# Patient Record
Sex: Female | Born: 1965 | Race: Black or African American | Hispanic: No | Marital: Married | Smoking: Never smoker
Health system: Southern US, Community
[De-identification: ages and names within clinical notes are randomized; demographics above are authoritative.]

## PROBLEM LIST (undated history)

## (undated) DIAGNOSIS — J189 Pneumonia, unspecified organism: Secondary | ICD-10-CM

## (undated) DIAGNOSIS — Z923 Personal history of irradiation: Secondary | ICD-10-CM

## (undated) DIAGNOSIS — E119 Type 2 diabetes mellitus without complications: Secondary | ICD-10-CM

## (undated) DIAGNOSIS — C50919 Malignant neoplasm of unspecified site of unspecified female breast: Secondary | ICD-10-CM

## (undated) DIAGNOSIS — I10 Essential (primary) hypertension: Secondary | ICD-10-CM

## (undated) HISTORY — DX: Malignant neoplasm of unspecified site of unspecified female breast: C50.919

## (undated) HISTORY — DX: Type 2 diabetes mellitus without complications: E11.9

---

## 2003-06-19 ENCOUNTER — Other Ambulatory Visit: Admission: RE | Admit: 2003-06-19 | Discharge: 2003-06-19 | Payer: Self-pay | Admitting: Family Medicine

## 2004-09-06 ENCOUNTER — Other Ambulatory Visit: Admission: RE | Admit: 2004-09-06 | Discharge: 2004-09-06 | Payer: Self-pay | Admitting: Family Medicine

## 2004-09-13 ENCOUNTER — Encounter: Admission: RE | Admit: 2004-09-13 | Discharge: 2004-09-13 | Payer: Self-pay | Admitting: Family Medicine

## 2006-09-24 ENCOUNTER — Encounter: Admission: RE | Admit: 2006-09-24 | Discharge: 2006-09-24 | Payer: Self-pay | Admitting: Family Medicine

## 2007-02-25 ENCOUNTER — Other Ambulatory Visit: Admission: RE | Admit: 2007-02-25 | Discharge: 2007-02-25 | Payer: Self-pay | Admitting: Family Medicine

## 2010-03-03 ENCOUNTER — Encounter: Payer: Self-pay | Admitting: Endocrinology

## 2010-05-27 ENCOUNTER — Other Ambulatory Visit (HOSPITAL_COMMUNITY)
Admission: RE | Admit: 2010-05-27 | Discharge: 2010-05-27 | Disposition: A | Discharge status: Home / Self Care | Payer: BC Managed Care – PPO | Source: Ambulatory Visit | Attending: Family Medicine | Admitting: Family Medicine

## 2010-05-27 ENCOUNTER — Other Ambulatory Visit: Payer: Self-pay | Admitting: Family Medicine

## 2010-05-27 DIAGNOSIS — Z113 Encounter for screening for infections with a predominantly sexual mode of transmission: Secondary | ICD-10-CM | POA: Insufficient documentation

## 2010-05-27 DIAGNOSIS — Z09 Encounter for follow-up examination after completed treatment for conditions other than malignant neoplasm: Secondary | ICD-10-CM

## 2010-05-27 DIAGNOSIS — Z1159 Encounter for screening for other viral diseases: Secondary | ICD-10-CM | POA: Insufficient documentation

## 2010-05-27 DIAGNOSIS — Z124 Encounter for screening for malignant neoplasm of cervix: Secondary | ICD-10-CM | POA: Insufficient documentation

## 2010-06-05 ENCOUNTER — Ambulatory Visit
Admission: RE | Admit: 2010-06-05 | Discharge: 2010-06-05 | Disposition: A | Discharge status: Home / Self Care | Payer: BC Managed Care – PPO | Source: Ambulatory Visit | Attending: Family Medicine | Admitting: Family Medicine

## 2010-06-05 DIAGNOSIS — Z09 Encounter for follow-up examination after completed treatment for conditions other than malignant neoplasm: Secondary | ICD-10-CM

## 2012-01-28 ENCOUNTER — Other Ambulatory Visit: Payer: Self-pay | Admitting: Family Medicine

## 2012-01-28 DIAGNOSIS — Z1231 Encounter for screening mammogram for malignant neoplasm of breast: Secondary | ICD-10-CM

## 2012-03-05 ENCOUNTER — Ambulatory Visit
Admission: RE | Admit: 2012-03-05 | Discharge: 2012-03-05 | Disposition: A | Discharge status: Home / Self Care | Payer: 59 | Source: Ambulatory Visit | Attending: Family Medicine | Admitting: Family Medicine

## 2012-03-05 DIAGNOSIS — Z1231 Encounter for screening mammogram for malignant neoplasm of breast: Secondary | ICD-10-CM

## 2012-09-07 ENCOUNTER — Encounter: Payer: 59 | Attending: Family Medicine | Admitting: *Deleted

## 2012-09-07 VITALS — Ht 66.0 in | Wt 254.6 lb

## 2012-09-07 DIAGNOSIS — E119 Type 2 diabetes mellitus without complications: Secondary | ICD-10-CM | POA: Insufficient documentation

## 2012-09-07 DIAGNOSIS — Z713 Dietary counseling and surveillance: Secondary | ICD-10-CM | POA: Insufficient documentation

## 2012-09-10 ENCOUNTER — Encounter: Payer: Self-pay | Admitting: *Deleted

## 2012-09-10 NOTE — Progress Notes (Signed)
Patient was seen on 09/07/2012 for the first of a series of three diabetes self-management courses at the [facility].   Current HbA1c: 7.2%  The following learning objectives were met by the patient during this course:   Defines the role of glucose and insulin  Identifies type of diabetes and pathophysiology  Defines the diagnostic criteria for diabetes and prediabetes  States the risk factors for Type 2 Diabetes  States the symptoms of Type 2 Diabetes  Defines Type 2 Diabetes treatment goals  Defines Type 2 Diabetes treatment options  States the rationale for glucose monitoring  Identifies A1C, glucose targets, and testing times  Identifies proper sharps disposal  Defines the purpose of a diabetes food plan  Identifies carbohydrate food groups  Defines effects of carbohydrate foods on glucose levels  Identifies carbohydrate choices/grams/food labels  States benefits of physical activity and effect on glucose  Review of suggested activity guidelines  Handouts given during class include:  Type 2 Diabetes: Basics Book  My Food Plan Book  Food and Activity Log  Your patient has identified their diabetes self-care support plan as:  [facility] support group  Continued diabetes classes  Follow-Up Plan: Attend core 2 and core 3

## 2012-09-14 ENCOUNTER — Encounter: Payer: 59 | Attending: Family Medicine

## 2012-09-14 DIAGNOSIS — Z713 Dietary counseling and surveillance: Secondary | ICD-10-CM | POA: Insufficient documentation

## 2012-09-14 DIAGNOSIS — E119 Type 2 diabetes mellitus without complications: Secondary | ICD-10-CM

## 2012-09-15 NOTE — Progress Notes (Signed)
Patient was seen on 09/14/12 for the second of a series of three diabetes self-management courses at the [facility]. The following learning objectives were met by the patient during this course:   Explain basic nutrition maintenance and quality assurance  Describe causes, symptoms and treatment of hypoglycemia and hyperglycemia  Explain how to manage diabetes during illness  Describe the importance of good nutrition for health and healthy eating strategies  List strategies to follow meal plan when dining out  Describe the effects of alcohol on glucose and how to use it safely  Describe problem solving skills for day-to-day glucose challenges  Describe strategies to use when treatment plan needs to change  Identify important factors involved in successful weight loss  Describe ways to remain physically active  Describe the impact of regular activity on insulin resistance  Identify current diabetes medications, their action on blood glucose, and [pssible side effects.  Handouts given in class:  Refrigerator magnet for Sick Day Guidelines  [facility] Oral medication/insulin handout  Your patient has identified their diabetes self-care support plan as:  [facility] support group   Follow-Up Plan: Patient will attend the final class of the ADA Diabetes Self-Care Education.   

## 2012-09-28 DIAGNOSIS — E119 Type 2 diabetes mellitus without complications: Secondary | ICD-10-CM

## 2012-10-01 NOTE — Progress Notes (Signed)
Patient was seen on 09/28/12 for the third of a series of three diabetes self-management courses at the [facility]. The following learning objectives were met by the patient during this course:    Describe how diabetes changes over time   Identify diabetes complications and ways to prevent them   Describe strategies that can promote heart health including lowering blood pressure and cholesterol   Describe strategies to lower dietary fat and sodium in the diet   Identify physical activities that benefit cardiovascular health   Describe role of stress on blood glucose and develop strategies to address psychosocial issues   Evaluate success in meeting personal goal   Describe the belief that they can live successfully with diabetes day to day   Establish 2-3 goals that they will plan to diligently work on until they return for the free 33-month follow-up visit  The following handouts were given in class:  Goal setting handout  Class evaluation form  Low-sodium seasoning tips  Stress management handout  Your patient has established the following 4 month goals for diabetes self-care:  Count my carbohydrates at most meals and snacks  Be active 45 minutes or more 4-5 days a week  Your patient has identified these potential barriers to change:  No barriers identified  Your patient has identified their diabetes self-care support plan as:  [facility] support group   Follow-Up Plan: Patient was offered a 4 month follow-up visit for diabetes self-management education.

## 2012-11-01 ENCOUNTER — Other Ambulatory Visit: Payer: Self-pay | Admitting: Family Medicine

## 2012-11-01 DIAGNOSIS — N939 Abnormal uterine and vaginal bleeding, unspecified: Secondary | ICD-10-CM

## 2012-11-05 ENCOUNTER — Ambulatory Visit
Admission: RE | Admit: 2012-11-05 | Discharge: 2012-11-05 | Disposition: A | Discharge status: Home / Self Care | Payer: 59 | Source: Ambulatory Visit | Attending: Family Medicine | Admitting: Family Medicine

## 2012-11-05 ENCOUNTER — Other Ambulatory Visit: Payer: 59

## 2012-11-05 DIAGNOSIS — N939 Abnormal uterine and vaginal bleeding, unspecified: Secondary | ICD-10-CM

## 2013-01-21 ENCOUNTER — Encounter: Payer: 59 | Attending: Family Medicine | Admitting: *Deleted

## 2013-01-21 VITALS — Ht 66.0 in | Wt 247.1 lb

## 2013-01-21 DIAGNOSIS — E119 Type 2 diabetes mellitus without complications: Secondary | ICD-10-CM | POA: Insufficient documentation

## 2013-01-21 DIAGNOSIS — Z713 Dietary counseling and surveillance: Secondary | ICD-10-CM | POA: Insufficient documentation

## 2013-01-21 NOTE — Progress Notes (Signed)
  Patient was seen on 01/21/13 for her 4 month follow-up as a part of the diabetes self-management courses at the [facility].   Patient self reports the following: Weight gain of 30# over the past 3 years. Is now working from home. Biggest Challenge.Marland KitchenMarland KitchenMarland KitchenExercise: Gets up in the AM and get on treadmill for approx 45-60 minutes daily. Considering dancing. Dietary: switched to Sprite Zero, significantly decreased chocolate consumption. A1c improved from prior to education now approx 6.7%  Down from 7.2%  Diabetes control has improved since diabetes self-management training: Has modified dietary consumption significantly and physical activity. Number of days blood glucose is >200: Testing has not been recommended Last MD appointment for diabetes: October 2014, next appointment scheduled March 2015 Changes in treatment plan: Begin testing glucose with Accu Check Nano 3X weekly varying times of the day to include FBS and 2hpp. Authorization received from Dr. Gretel Acre office [place]) [phone]. Their office has sent order for testing supplies to [facility] [address] Confidence with ability to manage diabetes: high Areas for improvement with diabetes self-care: Counting carbohydrates and testing Willingness to participate in diabetes support group: Not at this time Return in follow up 6 weeks to evaluate glucose readings and how they relate to her dietary consumption.

## 2013-02-22 ENCOUNTER — Telehealth: Payer: Self-pay | Admitting: *Deleted

## 2013-02-25 ENCOUNTER — Ambulatory Visit: Payer: 59 | Admitting: *Deleted

## 2013-03-25 ENCOUNTER — Ambulatory Visit: Payer: 59 | Admitting: *Deleted

## 2013-05-13 ENCOUNTER — Ambulatory Visit: Payer: 59 | Admitting: *Deleted

## 2013-05-31 NOTE — Telephone Encounter (Signed)
Telephone encounter.

## 2013-07-01 ENCOUNTER — Encounter: Payer: Self-pay | Admitting: *Deleted

## 2013-07-01 ENCOUNTER — Encounter: Payer: 59 | Attending: Family Medicine | Admitting: *Deleted

## 2013-07-01 VITALS — Ht 67.5 in | Wt 243.4 lb

## 2013-07-01 DIAGNOSIS — E119 Type 2 diabetes mellitus without complications: Secondary | ICD-10-CM | POA: Insufficient documentation

## 2013-07-01 DIAGNOSIS — Z713 Dietary counseling and surveillance: Secondary | ICD-10-CM | POA: Insufficient documentation

## 2013-07-01 DIAGNOSIS — R634 Abnormal weight loss: Secondary | ICD-10-CM | POA: Insufficient documentation

## 2013-07-01 NOTE — Progress Notes (Signed)
  Patient was seen on 07/01/13 for her final follow-up as a part of the diabetes self-management courses at the [facility].   Patient self reports the following: Happy with weight loss of another 4 pounds since last visit and total loss of 24 pounds since diagnosed with diabetes last year. Continues to exercise: Gets up in the AM and get on treadmill for approx 45-60 minutes daily. Considering a 5 K walk with her work friends Dietary: continues with switch to [employer], significantly decreased chocolate consumption. A1c improved from prior to education now approx 6.2%  Down from 7.2%  Follow up PRN

## 2014-02-10 DIAGNOSIS — C50919 Malignant neoplasm of unspecified site of unspecified female breast: Secondary | ICD-10-CM

## 2014-02-10 DIAGNOSIS — Z923 Personal history of irradiation: Secondary | ICD-10-CM

## 2014-02-10 HISTORY — DX: Malignant neoplasm of unspecified site of unspecified female breast: C50.919

## 2014-02-10 HISTORY — DX: Personal history of irradiation: Z92.3

## 2014-03-31 ENCOUNTER — Other Ambulatory Visit: Payer: Self-pay

## 2014-03-31 DIAGNOSIS — Z1231 Encounter for screening mammogram for malignant neoplasm of breast: Secondary | ICD-10-CM

## 2014-04-07 ENCOUNTER — Ambulatory Visit
Admission: RE | Admit: 2014-04-07 | Discharge: 2014-04-07 | Disposition: A | Discharge status: Home / Self Care | Payer: 59 | Source: Ambulatory Visit

## 2014-04-07 DIAGNOSIS — Z1231 Encounter for screening mammogram for malignant neoplasm of breast: Secondary | ICD-10-CM

## 2014-04-10 ENCOUNTER — Other Ambulatory Visit: Payer: Self-pay | Admitting: Family Medicine

## 2014-04-10 DIAGNOSIS — R928 Other abnormal and inconclusive findings on diagnostic imaging of breast: Secondary | ICD-10-CM

## 2014-04-14 ENCOUNTER — Ambulatory Visit
Admission: RE | Admit: 2014-04-14 | Discharge: 2014-04-14 | Disposition: A | Discharge status: Home / Self Care | Payer: 59 | Source: Ambulatory Visit | Attending: Family Medicine | Admitting: Family Medicine

## 2014-04-14 ENCOUNTER — Other Ambulatory Visit: Payer: Self-pay | Admitting: Family Medicine

## 2014-04-14 DIAGNOSIS — R928 Other abnormal and inconclusive findings on diagnostic imaging of breast: Secondary | ICD-10-CM

## 2014-04-14 DIAGNOSIS — R921 Mammographic calcification found on diagnostic imaging of breast: Secondary | ICD-10-CM

## 2014-04-18 ENCOUNTER — Other Ambulatory Visit: Payer: Self-pay | Admitting: Family Medicine

## 2014-04-18 DIAGNOSIS — C50911 Malignant neoplasm of unspecified site of right female breast: Secondary | ICD-10-CM

## 2014-04-19 ENCOUNTER — Inpatient Hospital Stay: Admission: RE | Admit: 2014-04-19 | Payer: 59 | Source: Ambulatory Visit

## 2014-04-19 ENCOUNTER — Telehealth: Payer: Self-pay | Admitting: *Deleted

## 2014-04-19 DIAGNOSIS — C50411 Malignant neoplasm of upper-outer quadrant of right female breast: Secondary | ICD-10-CM

## 2014-04-19 NOTE — Telephone Encounter (Signed)
Error

## 2014-04-19 NOTE — Telephone Encounter (Signed)
Left message for a return phone call to schedule for [facility] for 04/26/14.  Awaiting patient response.

## 2014-04-19 NOTE — Telephone Encounter (Signed)
Confirmed [facility] for 04/26/14 at 12N .  Instructions and contact information given.

## 2014-04-26 ENCOUNTER — Encounter: Payer: Self-pay | Admitting: Hematology and Oncology

## 2014-04-26 ENCOUNTER — Ambulatory Visit
Admission: RE | Admit: 2014-04-26 | Discharge: 2014-04-26 | Disposition: A | Discharge status: Home / Self Care | Payer: 59 | Source: Ambulatory Visit | Attending: Family Medicine | Admitting: Family Medicine

## 2014-04-26 ENCOUNTER — Ambulatory Visit
Admission: RE | Admit: 2014-04-26 | Discharge: 2014-04-26 | Disposition: A | Discharge status: Home / Self Care | Payer: 59 | Source: Ambulatory Visit | Attending: Radiation Oncology | Admitting: Radiation Oncology

## 2014-04-26 ENCOUNTER — Ambulatory Visit (HOSPITAL_BASED_OUTPATIENT_CLINIC_OR_DEPARTMENT_OTHER): Payer: 59 | Admitting: Hematology and Oncology

## 2014-04-26 ENCOUNTER — Ambulatory Visit: Payer: 59

## 2014-04-26 ENCOUNTER — Other Ambulatory Visit: Payer: Self-pay | Admitting: General Surgery

## 2014-04-26 ENCOUNTER — Other Ambulatory Visit (HOSPITAL_BASED_OUTPATIENT_CLINIC_OR_DEPARTMENT_OTHER): Payer: 59

## 2014-04-26 ENCOUNTER — Ambulatory Visit: Payer: 59 | Admitting: Physical Therapy

## 2014-04-26 VITALS — BP 152/88 | HR 113 | Temp 98.0°F | Resp 20 | Ht 67.5 in | Wt 248.7 lb

## 2014-04-26 DIAGNOSIS — D0511 Intraductal carcinoma in situ of right breast: Secondary | ICD-10-CM

## 2014-04-26 DIAGNOSIS — C50411 Malignant neoplasm of upper-outer quadrant of right female breast: Secondary | ICD-10-CM

## 2014-04-26 DIAGNOSIS — D0501 Lobular carcinoma in situ of right breast: Secondary | ICD-10-CM

## 2014-04-26 DIAGNOSIS — C50911 Malignant neoplasm of unspecified site of right female breast: Secondary | ICD-10-CM

## 2014-04-26 DIAGNOSIS — Z170 Estrogen receptor positive status [ER+]: Secondary | ICD-10-CM

## 2014-04-26 LAB — COMPREHENSIVE METABOLIC PANEL (CC13)
ALBUMIN: 4 g/dL (ref 3.5–5.0)
ALK PHOS: 71 U/L (ref 40–150)
ALT: 16 U/L (ref 0–55)
AST: 12 U/L (ref 5–34)
Anion Gap: 11 mEq/L (ref 3–11)
BILIRUBIN TOTAL: 0.28 mg/dL (ref 0.20–1.20)
BUN: 14.7 mg/dL (ref 7.0–26.0)
CO2: 27 meq/L (ref 22–29)
Calcium: 9.5 mg/dL (ref 8.4–10.4)
Chloride: 106 mEq/L (ref 98–109)
Creatinine: 1 mg/dL (ref 0.6–1.1)
EGFR: 80 mL/min/{1.73_m2} — AB (ref >90)
GLUCOSE: 121 mg/dL (ref 70–140)
POTASSIUM: 4.2 meq/L (ref 3.5–5.1)
Sodium: 144 mEq/L (ref 136–145)
TOTAL PROTEIN: 7.6 g/dL (ref 6.4–8.3)

## 2014-04-26 LAB — CBC WITH DIFFERENTIAL/PLATELET
BASO%: 1 % (ref 0.0–2.0)
Basophils Absolute: 0.1 10*3/uL (ref 0.0–0.1)
EOS ABS: 0.1 10*3/uL (ref 0.0–0.5)
EOS%: 1.2 % (ref 0.0–7.0)
HCT: 36.9 % (ref 34.8–46.6)
HEMOGLOBIN: 11.4 g/dL — AB (ref 11.6–15.9)
LYMPH%: 37.1 % (ref 14.0–49.7)
MCH: 26.1 pg (ref 25.1–34.0)
MCHC: 30.9 g/dL — ABNORMAL LOW (ref 31.5–36.0)
MCV: 84.4 fL (ref 79.5–101.0)
MONO#: 0.5 10*3/uL (ref 0.1–0.9)
MONO%: 8.4 % (ref 0.0–14.0)
NEUT%: 52.3 % (ref 38.4–76.8)
NEUTROS ABS: 3.3 10*3/uL (ref 1.5–6.5)
Platelets: 336 10*3/uL (ref 145–400)
RBC: 4.37 10*6/uL (ref 3.70–5.45)
RDW: 15.7 % — AB (ref 11.2–14.5)
WBC: 6.3 10*3/uL (ref 3.9–10.3)
lymph#: 2.3 10*3/uL (ref 0.9–3.3)

## 2014-04-26 MED ORDER — GADOBENATE DIMEGLUMINE 529 MG/ML IV SOLN
20.0000 mL | Freq: Once | INTRAVENOUS | Status: AC | PRN
  Administered 2014-04-26: 20 mL via INTRAVENOUS

## 2014-04-26 NOTE — Assessment & Plan Note (Signed)
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 She has an MRI scheduled for later today. I explained the difference between DCIS and LCIS versus invasive breast cancer. We discussed the significance of estrogen progesterone receptors and their significance on treatment decisions.   Recommendation based multidisciplinary tumor board: 1.  Breast conserving surgery followed by radiation therapy followed by 2.  Antiestrogen therapy with tamoxifen 20 mg once daily 5 years   Tamoxifen counseling: I briefly discussed the risks and benefits of tamoxifen. I will discuss this more with her once she is ready to get started on treatment.   return to clinic after surgery for follow-up and to discuss adjuvant treatment plan.

## 2014-04-26 NOTE — Progress Notes (Signed)
Ms. Wardrip is a very pleasant 49 y.o. 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  Biopsy results revealed the tumor's hormone status as ER positive and PR positive..  She presents today to the [facility]) for treatment consideration and recommendations from the breast surgeon, radiation oncologist, and medical oncologist.     I briefly met with Ms. Chauvin during her [facility] visit today. We discussed the purpose of the Survivorship Clinic, which will include monitoring for recurrence, coordinating completion of age and gender-appropriate cancer screenings, promotion of overall wellness, as well as managing potential late/long-term side effects of anti-cancer treatments.    The treatment plan for Ms. Mcgregory will likely include surgery, radiation therapy, and anti-estrogen therapy.  As of today, the intent of treatment for Ms. Stefano is cure, therefore she will be eligible for the Survivorship Clinic upon her completion of treatment.  Her survivorship care plan (SCP) document will be drafted and updated throughout the course of her treatment trajectory. She will receive the SCP in an office visit with myself in the Survivorship Clinic once she has completed treatment.   Ms. Outen was encouraged to ask questions and all questions were answered to her satisfaction.  She was given my business card and encouraged to contact me with any concerns regarding survivorship.  I look forward to participating in her care.   Mike Craze, NP [place] [phone]

## 2014-04-26 NOTE — Progress Notes (Signed)
Checked in new pt with no financial concerns prior to seeing the dr. Informed pt if chemo is part of her treatment I will get auth from her insurance company as well as contact foundations that offer copay assistance for chemo if needed. She has my card for any billing questions or concerns.

## 2014-04-26 NOTE — Progress Notes (Signed)
  Radiation Oncology         [phone]) [phone] ________________________________  Initial outpatient Consultation - Date: 04/26/2014   Name: [name] [name] MRN: [id number]   DOB: [birth year]  REFERRING PHYSICIAN: Jovita Kussmaul, MD  DIAGNOSIS:    ICD-9-CM ICD-10-CM   1. Breast cancer of upper-outer quadrant of right female breast 174.4 C50.411    HISTORY OF PRESENT ILLNESS::[name] [name] is a 49 y.o. female  Who underwent a screening mammogram and was found to have an area of calcifications in the upper outer quadrant. A biopsy was performed which showed mammary carcinoma in situ- likely DCIS and LCIS. Her DCIS was ER+PR+. She has an MRI tonight. She is GxP2. She is unaccompanied. She had done well with her biopsy. She is interested in breast conservation.   PREVIOUS RADIATION THERAPY: No  Past medical, social and family history were reviewed in the electronic chart. Review of symptoms was reviewed in the electronic chart. Medications were reviewed in the electronic chart.   PHYSICAL EXAM: There were no vitals filed for this visit.. . Pleasant female in no distress. Bruising in the upper outer quadrant with some palpable bruising of the right breast. No palpable lymphadenopathy. No palpable abnormalities of the left breast.   IMPRESSION: DCIS of the right breast  PLAN:I spoke to the patient today regarding her diagnosis and options for treatment. We discussed the equivalence in terms of survival and local failure between mastectomy and breast conservation. We discussed the role of radiation in decreasing local failures in patients who undergo lumpectomy. We discussed the process of simulation and the placement tattoos. We discussed 4-6 weeks of treatment as an outpatient. We discussed the possibility of asymptomatic lung damage. We discussed the low likelihood of secondary malignancies. We discussed the possible side effects including but not limited to skin redness, fatigue, permanent skin darkening, and  breast swelling.   We discussed her excellent prognosis. She may want to go on intermittent disability which I think is reasonable. She works for [employer] at home.   She met with physical therapy, social work, and our Industrial/product designer.   I spent 40 minutes  face to face with the patient and more than 50% of that time was spent in counseling and/or coordination of care.   ------------------------------------------------  Thea Silversmith, MD

## 2014-04-26 NOTE — Progress Notes (Signed)
[place] NOTE  Patient Care Team: Kathyrn Lass, MD as PCP - General (Family Medicine) Autumn Messing III, MD as Consulting Physician (General Surgery) Nicholas Lose, MD as Consulting Physician (Hematology and Oncology) Gery Pray, MD as Consulting Physician (Radiation Oncology) Rockwell Germany, RN as Registered Nurse Mauro Kaufmann, RN as Registered Nurse  CHIEF COMPLAINTS/PURPOSE OF CONSULTATION:  Newly diagnosed  Right breast DCIS plus LCIS  HISTORY OF PRESENTING ILLNESS:  [name] [name] 49 y.o. female is here because of recent diagnosis of  Right breast DCIS plus LCIS. Patient had a routine screening mammogram that detected calcifications in the right breast in the upper outer quadrant 7 mm in size at 9 to 10:00 position. This was biopsied and proven to be mammary carcinoma in situ with calcifications. The cancer was heterogeneous with some cells suggesting E-Cadherin positive and some were negative. ER was 95% and PR was 66%. She was presented the moderate spurring tumor board and she is here today at the [facility] clinic to discuss the treatment plan.  I reviewed her records extensively and collaborated the history with the patient.  SUMMARY OF ONCOLOGIC HISTORY:   Breast cancer of upper-outer quadrant of right female breast   04/14/2014 Mammogram Right breast upper outer quadrant 7 x 3 mm pleomorphic calcifications   04/14/2014 Initial Diagnosis Right breast needle biopsy: Mammary carcinoma in situ with calcifications at 9 to 10:00 position ER 95%, PR 66%, DCIS plus LCIS, heterogeneous somewhat E Cadherin positive some negative    In terms of breast cancer risk profile:  She menarched at early age of  48  She had  2 pregnancy, her first child was born at age  35  She  has received  IUD for approximately  5 years She was never exposed to fertility medications or hormone replacement therapy.  She has  no family history of Breast/GYN/GI cancer  MEDICAL HISTORY:  Past Medical  History  Diagnosis Date  . Diabetes mellitus without complication   . Breast cancer     SURGICAL HISTORY: History reviewed. No pertinent past surgical history.  SOCIAL HISTORY: History   Social History  . Marital Status: Married    Spouse Name: N/A  . Number of Children: N/A  . Years of Education: N/A   Occupational History  . Not on file.   Social History Main Topics  . Smoking status: Never Smoker   . Smokeless tobacco: Not on file  . Alcohol Use: No  . Drug Use: No  . Sexual Activity: Not on file   Other Topics Concern  . Not on file   Social History Narrative    FAMILY HISTORY: Family History  Problem Relation Age of Onset  . Colon cancer Mother     ALLERGIES:  is allergic to food; penicillins; and tetracyclines & related.  MEDICATIONS:  Current Outpatient Prescriptions  Medication Sig Dispense Refill  . metFORMIN (GLUCOPHAGE) 500 MG tablet Take 500 mg by mouth 2 (two) times daily with a meal.     No current facility-administered medications for this visit.    REVIEW OF SYSTEMS:   Constitutional: Denies fevers, chills or abnormal night sweats Eyes: Denies blurriness of vision, double vision or watery eyes Ears, nose, mouth, throat, and face: Denies mucositis or sore throat Respiratory: Denies cough, dyspnea or wheezes Cardiovascular: Denies palpitation, chest discomfort or lower extremity swelling Gastrointestinal:  Denies nausea, heartburn or change in bowel habits Skin: Denies abnormal skin rashes Lymphatics: Denies new lymphadenopathy or easy bruising  Neurological:Denies numbness, tingling or new weaknesses Behavioral/Psych: Mood is stable, no new changes  Breast:  Denies any palpable lumps or discharge All other systems were reviewed with the patient and are negative.  PHYSICAL EXAMINATION: ECOG PERFORMANCE STATUS: 0 - Asymptomatic  Filed Vitals:   04/26/14 1246  BP: 152/88  Pulse: 113  Temp: 98 F (36.7 C)  Resp: 20   Filed Weights    04/26/14 1246  Weight: 248 lb 11.2 oz (112.81 kg)    GENERAL:alert, no distress and comfortable SKIN: skin color, texture, turgor are normal, no rashes or significant lesions EYES: normal, conjunctiva are pink and non-injected, sclera clear OROPHARYNX:no exudate, no erythema and lips, buccal mucosa, and tongue normal  NECK: supple, thyroid normal size, non-tender, without nodularity LYMPH:  no palpable lymphadenopathy in the cervical, axillary or inguinal LUNGS: clear to auscultation and percussion with normal breathing effort HEART: regular rate & rhythm and no murmurs and no lower extremity edema ABDOMEN:abdomen soft, non-tender and normal bowel sounds Musculoskeletal:no cyanosis of digits and no clubbing  PSYCH: alert & oriented x 3 with fluent speech NEURO: no focal motor/sensory deficits BREAST: No palpable nodules in breast. No palpable axillary or supraclavicular lymphadenopathy (exam performed in the presence of a chaperone)   LABORATORY DATA:  I have reviewed the data as listed Lab Results  Component Value Date   WBC 6.3 04/26/2014   HGB 11.4* 04/26/2014   HCT 36.9 04/26/2014   MCV 84.4 04/26/2014   PLT 336 04/26/2014   Lab Results  Component Value Date   NA 144 04/26/2014   K 4.2 04/26/2014   CO2 27 04/26/2014    RADIOGRAPHIC STUDIES: I have personally reviewed the radiological reports and agreed with the findings in the report. 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 She has an MRI scheduled for later today. I explained the difference between DCIS and LCIS versus invasive breast cancer. We discussed the significance of estrogen progesterone receptors and their  significance on treatment decisions.   Recommendation based multidisciplinary tumor board: 1.  Breast conserving surgery followed by radiation therapy followed by 2.  Antiestrogen therapy with tamoxifen 20 mg once daily 5 years   Tamoxifen counseling: I briefly discussed the risks and benefits of tamoxifen. I will discuss this more with her once she is ready to get started on treatment.   return to clinic after surgery for follow-up and to discuss adjuvant treatment plan.    All questions were answered. The patient knows to call the clinic with any problems, questions or concerns.    Rulon Eisenmenger, MD 3:27 PM

## 2014-04-26 NOTE — Progress Notes (Signed)
Note created by Dr. Gudena during office visit, copy to patient,original to scan. 

## 2014-04-28 ENCOUNTER — Encounter: Payer: Self-pay | Admitting: General Practice

## 2014-04-28 ENCOUNTER — Other Ambulatory Visit: Payer: Self-pay | Admitting: Family Medicine

## 2014-04-28 DIAGNOSIS — R928 Other abnormal and inconclusive findings on diagnostic imaging of breast: Secondary | ICD-10-CM

## 2014-04-28 NOTE — Progress Notes (Signed)
   Note:  Previous distress screen (Initial Screen, beginning with distress score of 3) entered in error.  Correct data for Initial Screen follow below:  [place] Work  Visited with Gerica in [facility] to introduce [facility] team/resources and to review distress screen per protocol.  The patient scored a 9 on the Psychosocial Distress Thermometer which indicates severe distress. Clinical Also assessed for distress and other psychosocial needs.   ONCBCN DISTRESS SCREENING 04/28/2014  Screening Type Initial Screening  Distress experienced in past week (1-10) 9  Family Problem type   Emotional problem type Nervousness/Anxiety;Adjusting to illness  Information Concerns Type   Physical Problem type   Referral to support programs Yes  Other Spiritual Care, counseling interns   [place] describes herself as "private," "reserved," and having a "tendency to take on too much."  She has shared her dx with husband and older son, who was home for spring break.  Hasn't shared with younger son yet, but plans to after he has completed a couple of special planned activities.  She did share with her pastor, who has been helpful with support.  Kortne describes herself as "overwhelmed" and uses control to manage her sense of vulnerability.  Per pt, her mom died of cancer, which adds more layers of distress.  [place] was receptive to chaplain and counseling intern, noting the usefulness of support from someone outside her family/community (privacy; no danger of supporter's trying to "fix" her situation; no need to "protect" supporter).  Provided pastoral presence and reflective listening.  Described support resources and opportunities in detail.   Follow up needed: No.  Per pt's permission, will refer to [place].  Please also page as needs arise.  [place], [place]

## 2014-05-01 ENCOUNTER — Telehealth: Payer: Self-pay | Admitting: *Deleted

## 2014-05-01 NOTE — Telephone Encounter (Signed)
Spoke to pt concerning [place] from 04/26/14. Denies questions or concerns regarding dx or treatment care plan Scheduled for MRI on 3/25 for extent of dz. Encourage pt to call with needs. Received verbal understanding. Contact information given.

## 2014-05-05 ENCOUNTER — Ambulatory Visit
Admission: RE | Admit: 2014-05-05 | Discharge: 2014-05-05 | Disposition: A | Discharge status: Home / Self Care | Payer: 59 | Source: Ambulatory Visit | Attending: Family Medicine | Admitting: Family Medicine

## 2014-05-05 DIAGNOSIS — R928 Other abnormal and inconclusive findings on diagnostic imaging of breast: Secondary | ICD-10-CM

## 2014-05-05 MED ORDER — GADOBENATE DIMEGLUMINE 529 MG/ML IV SOLN: 20.0000 mL | Freq: Once | INTRAVENOUS | Status: AC | PRN

## 2014-05-22 ENCOUNTER — Other Ambulatory Visit: Payer: Self-pay | Admitting: General Surgery

## 2014-05-22 DIAGNOSIS — D0511 Intraductal carcinoma in situ of right breast: Secondary | ICD-10-CM

## 2014-05-24 ENCOUNTER — Other Ambulatory Visit: Payer: Self-pay | Admitting: General Surgery

## 2014-05-24 DIAGNOSIS — D0511 Intraductal carcinoma in situ of right breast: Secondary | ICD-10-CM

## 2014-05-30 ENCOUNTER — Other Ambulatory Visit: Payer: Self-pay | Admitting: *Deleted

## 2014-05-30 ENCOUNTER — Telehealth: Payer: Self-pay | Admitting: Hematology and Oncology

## 2014-05-30 NOTE — Telephone Encounter (Signed)
per pof to sch pt appt-cld & spoke to pt & gave pt time * date of appt

## 2014-06-01 ENCOUNTER — Encounter (HOSPITAL_BASED_OUTPATIENT_CLINIC_OR_DEPARTMENT_OTHER): Payer: Self-pay | Admitting: *Deleted

## 2014-06-02 ENCOUNTER — Encounter (HOSPITAL_BASED_OUTPATIENT_CLINIC_OR_DEPARTMENT_OTHER)
Admission: RE | Admit: 2014-06-02 | Discharge: 2014-06-02 | Disposition: A | Discharge status: Home / Self Care | Payer: 59 | Source: Ambulatory Visit | Attending: General Surgery | Admitting: General Surgery

## 2014-06-02 DIAGNOSIS — D0511 Intraductal carcinoma in situ of right breast: Secondary | ICD-10-CM | POA: Diagnosis present

## 2014-06-02 DIAGNOSIS — Z6839 Body mass index (BMI) 39.0-39.9, adult: Secondary | ICD-10-CM | POA: Diagnosis not present

## 2014-06-02 DIAGNOSIS — E669 Obesity, unspecified: Secondary | ICD-10-CM | POA: Diagnosis not present

## 2014-06-02 DIAGNOSIS — E119 Type 2 diabetes mellitus without complications: Secondary | ICD-10-CM | POA: Diagnosis not present

## 2014-06-02 DIAGNOSIS — Z79899 Other long term (current) drug therapy: Secondary | ICD-10-CM | POA: Diagnosis not present

## 2014-06-02 DIAGNOSIS — D0501 Lobular carcinoma in situ of right breast: Secondary | ICD-10-CM | POA: Diagnosis not present

## 2014-06-02 LAB — BASIC METABOLIC PANEL
Anion gap: 10 (ref 5–15)
BUN: 11 mg/dL (ref 6–23)
CO2: 30 mmol/L (ref 19–32)
CREATININE: 0.88 mg/dL (ref 0.50–1.10)
Calcium: 9 mg/dL (ref 8.4–10.5)
Chloride: 100 mmol/L (ref 96–112)
GFR calc Af Amer: 89 mL/min — ABNORMAL LOW (ref >90)
GFR calc non Af Amer: 76 mL/min — ABNORMAL LOW (ref >90)
Glucose, Bld: 163 mg/dL — ABNORMAL HIGH (ref 70–99)
Potassium: 3.9 mmol/L (ref 3.5–5.1)
Sodium: 140 mmol/L (ref 135–145)

## 2014-06-05 ENCOUNTER — Ambulatory Visit (HOSPITAL_BASED_OUTPATIENT_CLINIC_OR_DEPARTMENT_OTHER)
Admission: RE | Admit: 2014-06-05 | Discharge: 2014-06-05 | Disposition: A | Discharge status: Home / Self Care | Payer: 59 | Source: Ambulatory Visit | Attending: General Surgery | Admitting: General Surgery

## 2014-06-05 ENCOUNTER — Ambulatory Visit (HOSPITAL_BASED_OUTPATIENT_CLINIC_OR_DEPARTMENT_OTHER): Payer: 59 | Admitting: Anesthesiology

## 2014-06-05 ENCOUNTER — Ambulatory Visit
Admission: RE | Admit: 2014-06-05 | Discharge: 2014-06-05 | Disposition: A | Discharge status: Home / Self Care | Payer: 59 | Source: Ambulatory Visit | Attending: General Surgery | Admitting: General Surgery

## 2014-06-05 ENCOUNTER — Other Ambulatory Visit: Payer: Self-pay | Admitting: General Surgery

## 2014-06-05 ENCOUNTER — Encounter (HOSPITAL_BASED_OUTPATIENT_CLINIC_OR_DEPARTMENT_OTHER)
Admission: RE | Disposition: A | Discharge status: Home / Self Care | Payer: Self-pay | Source: Ambulatory Visit | Attending: General Surgery

## 2014-06-05 ENCOUNTER — Encounter: Payer: Self-pay | Admitting: Radiation Oncology

## 2014-06-05 ENCOUNTER — Encounter (HOSPITAL_BASED_OUTPATIENT_CLINIC_OR_DEPARTMENT_OTHER): Payer: Self-pay | Admitting: Anesthesiology

## 2014-06-05 DIAGNOSIS — D0511 Intraductal carcinoma in situ of right breast: Secondary | ICD-10-CM

## 2014-06-05 DIAGNOSIS — E669 Obesity, unspecified: Secondary | ICD-10-CM | POA: Insufficient documentation

## 2014-06-05 DIAGNOSIS — Z79899 Other long term (current) drug therapy: Secondary | ICD-10-CM | POA: Insufficient documentation

## 2014-06-05 DIAGNOSIS — Z6839 Body mass index (BMI) 39.0-39.9, adult: Secondary | ICD-10-CM | POA: Insufficient documentation

## 2014-06-05 DIAGNOSIS — N62 Hypertrophy of breast: Secondary | ICD-10-CM | POA: Insufficient documentation

## 2014-06-05 DIAGNOSIS — E119 Type 2 diabetes mellitus without complications: Secondary | ICD-10-CM | POA: Insufficient documentation

## 2014-06-05 DIAGNOSIS — C50911 Malignant neoplasm of unspecified site of right female breast: Secondary | ICD-10-CM | POA: Insufficient documentation

## 2014-06-05 HISTORY — PX: BREAST LUMPECTOMY WITH NEEDLE LOCALIZATION: SHX5759

## 2014-06-05 LAB — PREGNANCY, URINE: Preg Test, Ur: NEGATIVE

## 2014-06-05 LAB — POCT HEMOGLOBIN-HEMACUE: HEMOGLOBIN: 11.5 g/dL — AB (ref 12.0–15.0)

## 2014-06-05 LAB — GLUCOSE, CAPILLARY: GLUCOSE-CAPILLARY: 122 mg/dL — AB (ref 70–99)

## 2014-06-05 SURGERY — BREAST LUMPECTOMY WITH NEEDLE LOCALIZATION
Anesthesia: General | Site: Breast | Laterality: Right

## 2014-06-05 MED ORDER — LIDOCAINE-EPINEPHRINE (PF) 1 %-1:200000 IJ SOLN
INTRAMUSCULAR | Status: AC
  Filled 2014-06-05: qty 10

## 2014-06-05 MED ORDER — BUPIVACAINE HCL (PF) 0.25 % IJ SOLN
INTRAMUSCULAR | Status: AC
  Filled 2014-06-05: qty 30

## 2014-06-05 MED ORDER — MIDAZOLAM HCL 2 MG/2ML IJ SOLN
INTRAMUSCULAR | Status: AC
  Filled 2014-06-05: qty 2

## 2014-06-05 MED ORDER — FENTANYL CITRATE (PF) 100 MCG/2ML IJ SOLN
INTRAMUSCULAR | Status: AC
  Filled 2014-06-05: qty 2

## 2014-06-05 MED ORDER — VANCOMYCIN HCL 10 G IV SOLR: 1500.0000 mg | INTRAVENOUS | Status: DC

## 2014-06-05 MED ORDER — FENTANYL CITRATE (PF) 100 MCG/2ML IJ SOLN
INTRAMUSCULAR | Status: AC
  Filled 2014-06-05: qty 4

## 2014-06-05 MED ORDER — CHLORHEXIDINE GLUCONATE 4 % EX LIQD: 1.0000 "application " | Freq: Once | CUTANEOUS | Status: DC

## 2014-06-05 MED ORDER — OXYCODONE HCL 5 MG/5ML PO SOLN: 5.0000 mg | Freq: Once | ORAL | Status: AC | PRN

## 2014-06-05 MED ORDER — FENTANYL CITRATE (PF) 100 MCG/2ML IJ SOLN
INTRAMUSCULAR | Status: DC | PRN
  Administered 2014-06-05 (×2): 50 ug via INTRAVENOUS
  Administered 2014-06-05: 100 ug via INTRAVENOUS

## 2014-06-05 MED ORDER — VANCOMYCIN HCL IN DEXTROSE 500-5 MG/100ML-% IV SOLN
INTRAVENOUS | Status: AC
  Filled 2014-06-05: qty 100

## 2014-06-05 MED ORDER — PROPOFOL 10 MG/ML IV BOLUS
INTRAVENOUS | Status: DC | PRN
  Administered 2014-06-05: 200 mg via INTRAVENOUS
  Administered 2014-06-05: 50 mg via INTRAVENOUS

## 2014-06-05 MED ORDER — OXYCODONE HCL 5 MG PO TABS
5.0000 mg | ORAL_TABLET | Freq: Once | ORAL | Status: AC | PRN
  Administered 2014-06-05: 5 mg via ORAL

## 2014-06-05 MED ORDER — VANCOMYCIN HCL IN DEXTROSE 1-5 GM/200ML-% IV SOLN
INTRAVENOUS | Status: AC
  Filled 2014-06-05: qty 200

## 2014-06-05 MED ORDER — LACTATED RINGERS IV SOLN
INTRAVENOUS | Status: DC
  Administered 2014-06-05 (×3): via INTRAVENOUS

## 2014-06-05 MED ORDER — FENTANYL CITRATE (PF) 100 MCG/2ML IJ SOLN
25.0000 ug | INTRAMUSCULAR | Status: DC | PRN
  Administered 2014-06-05: 50 ug via INTRAVENOUS

## 2014-06-05 MED ORDER — LIDOCAINE HCL (CARDIAC) 20 MG/ML IV SOLN
INTRAVENOUS | Status: DC | PRN
  Administered 2014-06-05: 50 mg via INTRAVENOUS

## 2014-06-05 MED ORDER — ONDANSETRON HCL 4 MG/2ML IJ SOLN
INTRAMUSCULAR | Status: DC | PRN
  Administered 2014-06-05: 4 mg via INTRAVENOUS

## 2014-06-05 MED ORDER — BUPIVACAINE HCL (PF) 0.25 % IJ SOLN
INTRAMUSCULAR | Status: DC | PRN
  Administered 2014-06-05: 18 mL

## 2014-06-05 MED ORDER — ONDANSETRON HCL 4 MG/2ML IJ SOLN: 4.0000 mg | Freq: Once | INTRAMUSCULAR | Status: DC | PRN

## 2014-06-05 MED ORDER — DEXAMETHASONE SODIUM PHOSPHATE 4 MG/ML IJ SOLN
INTRAMUSCULAR | Status: DC | PRN
  Administered 2014-06-05: 10 mg via INTRAVENOUS

## 2014-06-05 MED ORDER — LIDOCAINE-EPINEPHRINE 0.5 %-1:200000 IJ SOLN
INTRAMUSCULAR | Status: AC
  Filled 2014-06-05: qty 1

## 2014-06-05 MED ORDER — OXYCODONE HCL 5 MG PO TABS
ORAL_TABLET | ORAL | Status: AC
  Filled 2014-06-05: qty 1

## 2014-06-05 MED ORDER — VANCOMYCIN HCL 10 G IV SOLR
1500.0000 mg | INTRAVENOUS | Status: AC
  Administered 2014-06-05: 1500 mg via INTRAVENOUS

## 2014-06-05 MED ORDER — FENTANYL CITRATE (PF) 100 MCG/2ML IJ SOLN: 50.0000 ug | INTRAMUSCULAR | Status: DC | PRN

## 2014-06-05 MED ORDER — OXYCODONE-ACETAMINOPHEN 5-325 MG PO TABS: 1.0000 | ORAL_TABLET | ORAL | Status: DC | PRN

## 2014-06-05 MED ORDER — MIDAZOLAM HCL 2 MG/2ML IJ SOLN
1.0000 mg | INTRAMUSCULAR | Status: DC | PRN
  Administered 2014-06-05: 2 mg via INTRAVENOUS

## 2014-06-05 SURGICAL SUPPLY — 35 items
BLADE SURG 15 STRL LF DISP TIS (BLADE) ×1 IMPLANT
BLADE SURG 15 STRL SS (BLADE) ×2
CANISTER SUCT 1200ML W/VALVE (MISCELLANEOUS) ×2 IMPLANT
CHLORAPREP W/TINT 26ML (MISCELLANEOUS) ×2 IMPLANT
CLIP TI WIDE RED SMALL 6 (CLIP) ×1 IMPLANT
COVER BACK TABLE 60X90IN (DRAPES) ×2 IMPLANT
COVER MAYO STAND STRL (DRAPES) ×2 IMPLANT
DECANTER SPIKE VIAL GLASS SM (MISCELLANEOUS) ×2 IMPLANT
DEVICE DUBIN W/COMP PLATE 8390 (MISCELLANEOUS) ×4 IMPLANT
DRAPE LAPAROSCOPIC ABDOMINAL (DRAPES) ×2 IMPLANT
DRAPE UTILITY XL STRL (DRAPES) ×2 IMPLANT
ELECT COATED BLADE 2.86 ST (ELECTRODE) ×2 IMPLANT
ELECT REM PT RETURN 9FT ADLT (ELECTROSURGICAL) ×2
ELECTRODE REM PT RTRN 9FT ADLT (ELECTROSURGICAL) ×1 IMPLANT
GLOVE BIO SURGEON STRL SZ7.5 (GLOVE) ×2 IMPLANT
GOWN STRL REUS W/ TWL LRG LVL3 (GOWN DISPOSABLE) ×2 IMPLANT
GOWN STRL REUS W/TWL LRG LVL3 (GOWN DISPOSABLE) ×4
KIT MARKER MARGIN INK (KITS) IMPLANT
LIQUID BAND (GAUZE/BANDAGES/DRESSINGS) ×2 IMPLANT
NDL HYPO 25X1 1.5 SAFETY (NEEDLE) ×1 IMPLANT
NEEDLE HYPO 25X1 1.5 SAFETY (NEEDLE) ×2 IMPLANT
NS IRRIG 1000ML POUR BTL (IV SOLUTION) ×2 IMPLANT
PACK BASIN DAY SURGERY FS (CUSTOM PROCEDURE TRAY) ×2 IMPLANT
PENCIL BUTTON HOLSTER BLD 10FT (ELECTRODE) ×2 IMPLANT
SLEEVE SCD COMPRESS KNEE MED (MISCELLANEOUS) ×2 IMPLANT
SPONGE LAP 18X18 X RAY DECT (DISPOSABLE) ×2 IMPLANT
STAPLER VISISTAT 35W (STAPLE) IMPLANT
SUT MON AB 4-0 PC3 18 (SUTURE) ×3 IMPLANT
SUT SILK 2 0 SH (SUTURE) ×3 IMPLANT
SUT VICRYL 3-0 CR8 SH (SUTURE) ×2 IMPLANT
SYR CONTROL 10ML LL (SYRINGE) ×2 IMPLANT
TOWEL OR 17X24 6PK STRL BLUE (TOWEL DISPOSABLE) ×3 IMPLANT
TOWEL OR NON WOVEN STRL DISP B (DISPOSABLE) ×2 IMPLANT
TUBE CONNECTING 20X1/4 (TUBING) ×2 IMPLANT
YANKAUER SUCT BULB TIP NO VENT (SUCTIONS) ×2 IMPLANT

## 2014-06-05 NOTE — Transfer of Care (Signed)
Immediate Anesthesia Transfer of Care Note  Patient: [name] [name]  Procedure(s) Performed: Procedure(s): RIGHT BREAST LUMPECTOMY WITH NEEDLE LOCALIZATION (Right)  Patient Location: PACU  Anesthesia Type:General  Level of Consciousness: sedated  Airway & Oxygen Therapy: Patient Spontanous Breathing and Patient connected to face mask oxygen  Post-op Assessment: Report given to RN and Post -op Vital signs reviewed and stable  Post vital signs: Reviewed and stable  Last Vitals:  Filed Vitals:   06/05/14 1209  BP: 146/80  Pulse: 72  Temp: 36.5 C  Resp: 18    Complications: No apparent anesthesia complications

## 2014-06-05 NOTE — Anesthesia Preprocedure Evaluation (Signed)
Anesthesia Evaluation  Patient identified by MRN, date of birth, ID band Patient awake    Reviewed: Allergy & Precautions, NPO status , Patient's Chart, lab work & pertinent test results  Airway Mallampati: II  TM Distance: >3 FB Neck ROM: Full    Dental  (+) Teeth Intact   Pulmonary  breath sounds clear to auscultation        Cardiovascular Rhythm:Regular Rate:Normal     Neuro/Psych    GI/Hepatic   Endo/Other  diabetes  Renal/GU      Musculoskeletal   Abdominal (+) + obese,   Peds  Hematology   Anesthesia Other Findings   Reproductive/Obstetrics                             Anesthesia Physical Anesthesia Plan  ASA: II  Anesthesia Plan: General   Post-op Pain Management:    Induction: Intravenous  Airway Management Planned: LMA  Additional Equipment:   Intra-op Plan:   Post-operative Plan:   Informed Consent: I have reviewed the patients History and Physical, chart, labs and discussed the procedure including the risks, benefits and alternatives for the proposed anesthesia with the patient or authorized representative who has indicated his/her understanding and acceptance.   Dental advisory given  Plan Discussed with: CRNA and Anesthesiologist  Anesthesia Plan Comments: (Type 2 DM Obesity  Plan GA with LMA  Roberts Gaudy)        Anesthesia Quick Evaluation

## 2014-06-05 NOTE — Op Note (Signed)
06/05/2014  3:14 PM  PATIENT:  [name] [name]  49 y.o. female  PRE-OPERATIVE DIAGNOSIS:  Right Breat DCIS and LCIS  POST-OPERATIVE DIAGNOSIS:  Right Breat DCIS and LCIS  PROCEDURE:  Procedure(s): RIGHT BREAST LUMPECTOMY WITH NEEDLE LOCALIZATION (Right) X 2  SURGEON:  Surgeon(s) and Role:    * Autumn Messing III, MD - Primary  PHYSICIAN ASSISTANT:   ASSISTANTS: none   ANESTHESIA:   general  EBL:  Total I/O In: 1700 [I.V.:1700] Out: -   BLOOD ADMINISTERED:none  DRAINS: none   LOCAL MEDICATIONS USED:  MARCAINE     SPECIMEN:  Source of Specimen:  right breast tissue medial and lateral with additional deep margins from the medial specimen  DISPOSITION OF SPECIMEN:  PATHOLOGY  COUNTS:  YES  TOURNIQUET:  * No tourniquets in log *  DICTATION: .Dragon Dictation  After informed consent was obtained the patient was brought to the operating room and placed in the supine position on the operating room table. After adequate induction of general anesthesia the patient's right breast was prepped with ChloraPrep, allowed to dry, and draped in usual sterile manner. Earlier in the day the patient underwent 2 wire localization procedures of the right breast, one was medial and one was lateral. Attention was first turned to the medial lesion which was LCIS. A curvilinear incision was made at the medial edge of the areola with a 15 blade knife. This incision was carried through the skin and subcutaneous tissue sharply with the electrocautery. Once into the breast tissue the path of the wire could be palpated. A circular portion of breast tissue was excised sharply around the path of the wire. The specimen was then oriented with a short double stitch on the superior surface, a long double stitch on the lateral surface, and a short single stitch anteriorly. A specimen radiograph was obtained that showed the wire to be in the center of the specimen but there was no clip. On reviewing the mammogram the clip  appeared to be posterior to the wire so additional posterior tissue was taken all the way to the chest Zendejas both superiorly and inferiorly and no additional clip was identified. Because this lesion was not cancerous we decided to not take any further tissue from the breast in this area. The wound was irrigated with copious amounts of saline and infiltrated with quarter percent Marcaine. The area was hemostatic. The deep layer of the wound was closed with interrupted 3-0 Vicryl stitches. The skin was then closed with interrupted 4-0 Monocryl subcuticular stitches. Attention was then turned to the lateral lesion which was DCIS. Again a curvilinear incision was made at the lateral edge of the areola with a 15 blade knife. The incision was carried through the skin and subcutaneous tissue sharply with electrocautery. Once into the breast tissue the path of the wire could be palpated. A circular portion of breast tissue was excised sharply around the path of the wire. Once the specimen was removed it was oriented with the appropriate paint colors. A specimen radiograph was obtained that showed the clip and wire to be in the center of the specimen. The specimen was then sent to pathology for further evaluation. The edges of the lateral cavity were marked with clips. Of note the lateral cavity did communicate with the medial cavity on the deep side. The wound was infiltrated with quarter percent Marcaine and irrigated with saline. The deep layer of the wound was closed with interrupted 3-0 Vicryl stitches. The skin was then  closed with interrupted 4-0 Monocryl subcuticular stitches. Dermabond dressings were applied. The patient tolerated the procedure well. At the end of the case all needle sponge and instrument counts were correct. The patient was then awakened and taken to recovery in stable condition.  PLAN OF CARE: Discharge to home after PACU  PATIENT DISPOSITION:  PACU - hemodynamically stable.   Delay start of  Pharmacological VTE agent (>24hrs) due to surgical blood loss or risk of bleeding: not applicable

## 2014-06-05 NOTE — H&P (Signed)
[name] [name] 05/22/2014 4:27 PM Location: [facility] Patient #: [id number] DOB: [birth year] Married / Language: English / Race: Black or African American Female  History of Present Illness Sammuel [name]. [name] Starks MD; 05/22/2014 4:58 PM) Patient words: discuss breast surgery.  The patient is a 49 year old female who presents for a follow-up for Breast cancer. 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 Her recent MRI showed additional 3 cm of enhancement coming off medially from the previous biopsy. 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 She returns today to talk about options for surgery. She is still very interested in breast conservation.   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  Social History [name] [name], [place]; 05/22/2014 4:28 PM) Alcohol use Occasional alcohol use. Caffeine use Carbonated beverages. No drug use Tobacco use Never smoker.  Family History [name] [name], [place]; 05/22/2014 4:28 PM) Arthritis Father. Colon Cancer Mother. Diabetes Mellitus Mother. Heart Disease Father.  Pregnancy / Birth History [name] [name], [place]; 05/22/2014 4:28 PM) Age at menarche 61 years. Contraceptive History Intrauterine device. Gravida 4 Maternal age 44-30 Para 2 Regular periods  Review of Systems  [name] [name] S. [name] Starks MD; 05/22/2014 4:58 PM) General Not Present- Appetite Loss, Chills, Fatigue, Fever, Night Sweats, Weight Gain and Weight Loss. Skin Not Present- Change in Wart/Mole, Dryness, Hives, Jaundice, New Lesions, Non-Healing Wounds, Rash and Ulcer. HEENT Present- Wears glasses/contact lenses. Not Present- Earache, Hearing Loss, Hoarseness, Nose Bleed, Oral Ulcers, Ringing in the Ears, Seasonal Allergies, Sinus Pain, Sore Throat, Visual Disturbances and Yellow Eyes. Respiratory Not Present- Bloody sputum, Chronic Cough, Difficulty Breathing, Snoring and Wheezing. Breast Not Present- Breast Mass, Breast Pain, Nipple Discharge and Skin Changes. Cardiovascular Not Present- Chest Pain, Difficulty Breathing Lying Down, Leg Cramps, Palpitations, Rapid Heart Rate, Shortness of Breath and Swelling of Extremities. Gastrointestinal Not Present- Abdominal Pain, Bloating, Bloody Stool, Change in Bowel Habits, Chronic diarrhea, Constipation, Difficulty Swallowing, Excessive gas, Gets full quickly at meals, Hemorrhoids, Indigestion, Nausea, Rectal Pain and Vomiting. Female Genitourinary Not Present- Frequency, Nocturia, Painful Urination, Pelvic Pain and Urgency. Musculoskeletal Not Present- Back Pain, Joint Pain, Joint Stiffness, Muscle Pain, Muscle Weakness and Swelling of Extremities. Neurological Not Present- Decreased Memory, Fainting, Headaches, Numbness, Seizures, Tingling, Tremor, Trouble walking and Weakness. Psychiatric Not Present- Anxiety, Bipolar, Change in Sleep Pattern, Depression, Fearful and Frequent crying. Endocrine Present- New Diabetes. Not Present- Cold Intolerance, Excessive Hunger, Hair Changes, Heat Intolerance and Hot flashes. Hematology Not Present- Easy Bruising, Excessive bleeding, Gland problems, HIV and Persistent Infections.   Vitals [employer] R. Brooks CMA; 05/22/2014 4:28 PM) 05/22/2014 4:27 PM Weight: 250.5 lb Height: 67.5in Body Surface Area: 2.33 m Body Mass Index:  38.65 kg/m BP: 132/88 (Sitting, Left Arm, Standard)    Physical Exam [name] [name] S. [name] Starks MD; 05/22/2014 4:58 PM) General Mental Status-Alert. General Appearance-Consistent with stated age. Hydration-Well hydrated. Voice-Normal.  Head and Neck Head-normocephalic, atraumatic with no lesions or palpable  masses. Trachea-midline. Thyroid Gland Characteristics - normal size and consistency.  Eye Eyeball - Bilateral-Extraocular movements intact. Sclera/Conjunctiva - Bilateral-No scleral icterus.  Chest and Lung Exam Chest and lung exam reveals -quiet, even and easy respiratory effort with no use of accessory muscles and on auscultation, normal breath sounds, no adventitious sounds and normal vocal resonance. Inspection Chest Zerkle - Normal. Back - normal.  Breast Note: There is no palpable mass in either breast. There is no palpable axillary, supraclavicular, or cervical lymphadenopathy.   Cardiovascular Cardiovascular examination reveals -normal heart sounds, regular rate and rhythm with no murmurs and normal pedal pulses bilaterally.  Abdomen Inspection Inspection of the abdomen reveals - No Hernias. Skin - Scar - no surgical scars. Palpation/Percussion Palpation and Percussion of the abdomen reveal - Soft, Non Tender, No Rebound tenderness, No Rigidity (guarding) and No hepatosplenomegaly. Auscultation Auscultation of the abdomen reveals - Bowel sounds normal.  Neurologic Neurologic evaluation reveals -alert and oriented x 3 with no impairment of recent or remote memory. Mental Status-Normal.  Musculoskeletal Normal Exam - Left-Upper Extremity Strength Normal and Lower Extremity Strength Normal. Normal Exam - Right-Upper Extremity Strength Normal and Lower Extremity Strength Normal.  Lymphatic Head & Neck  General Head & Neck Lymphatics: Bilateral - Description - Normal. Axillary  General Axillary Region: Bilateral - Description - Normal.  Tenderness - Non Tender. Femoral & Inguinal  Generalized Femoral & Inguinal Lymphatics: Bilateral - Description - Normal. Tenderness - Non Tender.    Assessment & Plan [name] [name] S. 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 Her recent MRI showed 3 cm of enhancement medial to this that was biopsied and came back as LCIS. I have talked her today about the different options for treatment and I still think she is a very good candidate for breast conservation. She would also like to pursue breast conservation. Instead of localizing the area with a radioactive seed I think she may be best served with a bracketed wire localization. I have discussed with her in detail the risks and benefits of the operation to do this as well as some of the technical aspects and she understands and wishes to proceed     Signed by Luella Cook, MD (05/22/2014 4:59 PM)

## 2014-06-05 NOTE — Progress Notes (Signed)
4.25.16:  FMLA paperwork rec'd from patient.  Forward to RN for processing.

## 2014-06-05 NOTE — Interval H&P Note (Signed)
History and Physical Interval Note:  06/05/2014 12:55 PM  [name] [name]  has presented today for surgery, with the diagnosis of Right Breat DCIS  The various methods of treatment have been discussed with the patient and family. After consideration of risks, benefits and other options for treatment, the patient has consented to  Procedure(s): RIGHT BREAST LUMPECTOMY WITH NEEDLE LOCALIZATION (Right) as a surgical intervention .  The patient's history has been reviewed, patient examined, no change in status, stable for surgery.  I have reviewed the patient's chart and labs.  Questions were answered to the patient's satisfaction.     TOTH III,PAUL S

## 2014-06-05 NOTE — Discharge Instructions (Signed)

## 2014-06-05 NOTE — Anesthesia Postprocedure Evaluation (Signed)
  Anesthesia Post-op Note  Patient: [name] [name]  Procedure(s) Performed: Procedure(s): RIGHT BREAST LUMPECTOMY WITH NEEDLE LOCALIZATION (Right)  Patient Location: PACU  Anesthesia Type: General   Level of Consciousness: awake, alert  and oriented  Airway and Oxygen Therapy: Patient Spontanous Breathing  Post-op Pain: mild  Post-op Assessment: Post-op Vital signs reviewed  Post-op Vital Signs: Reviewed  Last Vitals:  Filed Vitals:   06/05/14 1626  BP: 160/88  Pulse: 82  Temp: 36.8 C  Resp: 18    Complications: No apparent anesthesia complications

## 2014-06-06 ENCOUNTER — Encounter (HOSPITAL_BASED_OUTPATIENT_CLINIC_OR_DEPARTMENT_OTHER): Payer: Self-pay | Admitting: General Surgery

## 2014-06-13 ENCOUNTER — Other Ambulatory Visit: Payer: Self-pay | Admitting: General Surgery

## 2014-06-13 DIAGNOSIS — C50911 Malignant neoplasm of unspecified site of right female breast: Secondary | ICD-10-CM

## 2014-06-15 ENCOUNTER — Encounter: Payer: Self-pay | Admitting: Radiation Oncology

## 2014-06-15 NOTE — Progress Notes (Signed)
5.5.16:  Rec'd completed FMLA paperwork from physician.  Faxed to [phone], confirmation rec'd.  Made copy for scanning and put original in envelope to return to patient on consult day.

## 2014-06-18 NOTE — Assessment & Plan Note (Signed)
Rt Lumpectomy 06/05/14: ILC 0.6 cm (rt Medial) 1.7 cm (Rt Lateral)Margins Neg, Grade 2 with LCIS, ER 100%, PR 99%, Ki 67 6%, Her 2 Neg   Pathology and radiology review: I discussed the pathology report in great detail and provided her with a copy of the results.   Recommendation: 1.  Oncotype Dx testing to determine benefit to chemo followed by 2. Followed by XRT foll by 3.  Antiestrogen therapy with tamoxifen 20 mg once daily  Oncotype Dx counseling: I discussed Oncotype DX test. I explained to the patient that this is a 21 gene panel to evaluate patient tumors DNA to calculate recurrence score. This would help determine whether patient has high risk or intermediate risk or low risk breast cancer. She understands that if her tumor was found to be high risk, she would benefit from systemic chemotherapy. If low risk, no need of chemotherapy. If she was found to be intermediate risk, we would need to evaluate the score as well as other risk factors and determine if an abbreviated chemotherapy may be of benefit.   return to clinic after oncotype dx test results are available

## 2014-06-19 ENCOUNTER — Ambulatory Visit (HOSPITAL_BASED_OUTPATIENT_CLINIC_OR_DEPARTMENT_OTHER): Payer: 59 | Admitting: Hematology and Oncology

## 2014-06-19 ENCOUNTER — Telehealth: Payer: Self-pay | Admitting: Hematology and Oncology

## 2014-06-19 ENCOUNTER — Encounter: Payer: Self-pay | Admitting: *Deleted

## 2014-06-19 VITALS — BP 120/79 | HR 87 | Temp 98.1°F | Resp 19 | Ht 67.5 in | Wt 249.2 lb

## 2014-06-19 DIAGNOSIS — Z170 Estrogen receptor positive status [ER+]: Secondary | ICD-10-CM | POA: Diagnosis not present

## 2014-06-19 DIAGNOSIS — C50411 Malignant neoplasm of upper-outer quadrant of right female breast: Secondary | ICD-10-CM

## 2014-06-19 NOTE — Telephone Encounter (Signed)
Appointments made and avs printed for patient °

## 2014-06-19 NOTE — Progress Notes (Signed)
Patient Care Team: Kathyrn Lass, MD as PCP - General (Family Medicine) Autumn Messing III, MD as Consulting Physician (General Surgery) Nicholas Lose, MD as Consulting Physician (Hematology and Oncology) Gery Pray, MD as Consulting Physician (Radiation Oncology) Rockwell Germany, RN as Registered Nurse Mauro Kaufmann, RN as Registered Nurse Holley Bouche, NP as Nurse Practitioner (Nurse Practitioner)  DIAGNOSIS: Breast cancer of upper-outer quadrant of right female breast   Staging form: Breast, AJCC 7th Edition     Clinical stage from 04/26/2014: Stage 0 (Tis (DCIS), N0, M0) - Unsigned       Staging comments: Staged at breast conference 3.16.16    SUMMARY OF ONCOLOGIC HISTORY:   Breast cancer of upper-outer quadrant of right female breast   04/14/2014 Mammogram Right breast upper outer quadrant 7 x 3 mm pleomorphic calcifications   04/14/2014 Initial Diagnosis Right breast needle biopsy: Mammary carcinoma in situ with calcifications at 9 to 10:00 position ER 95%, PR 66%, DCIS plus LCIS, heterogeneous somewhat E Cadherin positive some negative   06/06/2014 Surgery Rt Lumpectomy: ILC 0.6 cm (rt Medial) 1.7 cm (Rt Lateral)Margins Neg, Grade 2 with LCIS, ER 100%, PR 99%, Ki 67 6%, Her 2 Neg    CHIEF COMPLIANT:  Follow-up after surgery  INTERVAL HISTORY: [name] [name] is a  49 year old lady with above-mentioned history of right-sided breast cancer who underwent a lumpectomy and is here today to discuss the final pathology report. She is recovering very well from surgery without any major problems or concerns.  REVIEW OF SYSTEMS:   Constitutional: Denies fevers, chills or abnormal weight loss Eyes: Denies blurriness of vision Ears, nose, mouth, throat, and face: Denies mucositis or sore throat Respiratory: Denies cough, dyspnea or wheezes Cardiovascular: Denies palpitation, chest discomfort or lower extremity swelling Gastrointestinal:  Denies nausea, heartburn or change in bowel habits Skin:  Denies abnormal skin rashes Lymphatics: Denies new lymphadenopathy or easy bruising Neurological:Denies numbness, tingling or new weaknesses Behavioral/Psych: Mood is stable, no new changes  Breast:  Mild soreness in the breast and recent surgery All other systems were reviewed with the patient and are negative.  I have reviewed the past medical history, past surgical history, social history and family history with the patient and they are unchanged from previous note.  ALLERGIES:  is allergic to food; penicillins; and tetracyclines & related.  MEDICATIONS:  Current Outpatient Prescriptions  Medication Sig Dispense Refill  . metFORMIN (GLUCOPHAGE) 500 MG tablet Take 500 mg by mouth 2 (two) times daily with a meal.    . oxyCODONE-acetaminophen (ROXICET) 5-325 MG per tablet Take 1-2 tablets by mouth every 4 (four) hours as needed. 50 tablet 0   No current facility-administered medications for this visit.    PHYSICAL EXAMINATION: ECOG PERFORMANCE STATUS: 1 - Symptomatic but completely ambulatory  Filed Vitals:   06/19/14 1218  BP: 120/79  Pulse: 87  Temp: 98.1 F (36.7 C)  Resp: 19   Filed Weights   06/19/14 1218  Weight: 249 lb 3.2 oz (113.036 kg)    GENERAL:alert, no distress and comfortable SKIN: skin color, texture, turgor are normal, no rashes or significant lesions EYES: normal, Conjunctiva are pink and non-injected, sclera clear OROPHARYNX:no exudate, no erythema and lips, buccal mucosa, and tongue normal  NECK: supple, thyroid normal size, non-tender, without nodularity LYMPH:  no palpable lymphadenopathy in the cervical, axillary or inguinal LUNGS: clear to auscultation and percussion with normal breathing effort HEART: regular rate & rhythm and no murmurs and no lower extremity edema ABDOMEN:abdomen soft,  non-tender and normal bowel sounds Musculoskeletal:no cyanosis of digits and no clubbing  NEURO: alert & oriented x 3 with fluent speech, no focal motor/sensory  deficits  LABORATORY DATA:  I have reviewed the data as listed   Chemistry      Component Value Date/Time   NA 140 06/02/2014 1530   NA 144 04/26/2014 1224   K 3.9 06/02/2014 1530   K 4.2 04/26/2014 1224   CL 100 06/02/2014 1530   CO2 30 06/02/2014 1530   CO2 27 04/26/2014 1224   BUN 11 06/02/2014 1530   BUN 14.7 04/26/2014 1224   CREATININE 0.88 06/02/2014 1530   CREATININE 1.0 04/26/2014 1224      Component Value Date/Time   CALCIUM 9.0 06/02/2014 1530   CALCIUM 9.5 04/26/2014 1224   ALKPHOS 71 04/26/2014 1224   AST 12 04/26/2014 1224   ALT 16 04/26/2014 1224   BILITOT 0.28 04/26/2014 1224       Lab Results  Component Value Date   WBC 6.3 04/26/2014   HGB 11.5* 06/05/2014   HCT 36.9 04/26/2014   MCV 84.4 04/26/2014   PLT 336 04/26/2014   NEUTROABS 3.3 04/26/2014    ASSESSMENT & PLAN:  Breast cancer of upper-outer quadrant of right female breast Rt Lumpectomy 06/05/14: ILC 0.6 cm (rt Medial) 1.7 cm (Rt Lateral)Margins Neg, Grade 2 with LCIS, ER 100%, PR 99%, Ki 67 6%, Her 2 Neg   Pathology and radiology review: I discussed the pathology report in great detail and provided her with a copy of the results.   Recommendation:  Patient is going to be taken for surgery for  Lymph node evaluation  1.  Oncotype Dx testing to determine benefit to chemo followed by 2. Followed by XRT foll by 3.  Antiestrogen therapy with tamoxifen 20 mg once daily  Oncotype Dx counseling: I discussed Oncotype DX test. I explained to the patient that this is a 21 gene panel to evaluate patient tumors DNA to calculate recurrence score. This would help determine whether patient has high risk or intermediate risk or low risk breast cancer. She understands that if her tumor was found to be high risk, she would benefit from systemic chemotherapy. If low risk, no need of chemotherapy. If she was found to be intermediate risk, we would need to evaluate the score as well as other risk factors and  determine if an abbreviated chemotherapy may be of benefit.   return to clinic after oncotype dx test results are available  No orders of the defined types were placed in this encounter.   The patient has a good understanding of the overall plan. she agrees with it. she will call with any problems that may develop before the next visit here.   Rulon Eisenmenger, MD

## 2014-06-19 NOTE — Progress Notes (Signed)
Location of Breast Cancer:Rightupper-outer quadrant breast DCIS and LCIS  Histology per Pathology Report:  06/05/2014 Diagnosis 1. Breast, lumpectomy, right medial - INVASIVE LOBULAR CARCINOMA, GRADE II/III, SPANNING 0.6 CM. - LOBULAR CARCINOMA IN SITU. - INVASIVE CARCINOMA IS FOCALLY PRESENT AT THE INFERIOR MARGIN OF SPECIMEN #1. - SEE ONCOLOGY TABLE BELOW. 2. Breast, excision, right additional inferior and deep - LOBULAR NEOPLASIA (ATYPICAL LOBULAR HYPERPLASIA). - SEE COMMENT. 3. Breast, excision, right, additional deep and superior - BENIGN BREAST PARENCHYMA. - THERE IS NO EVIDENCE OF MALIGNANCY. - SEE COMMENT. 4. Breast, lumpectomy, right lateral - INVASIVE LOBULAR CARCINOMA, GRADE II/III, SPANNING 1.7 CM. - LOBULAR CARCINOMA IN SITU. - THE SURGICAL RESECTION MARGINS ARE NEGATIVE FOR INVASIVE CARCINOMA. - SEE ONCOLOGY TABLE ON PART 1.  Receptor Status: ER(+), PR (+), Her2-neu () Calcifications  found on routine screening mammography later proven by biopsy.   Past/Anticipated interventions by surgeon, if any:06/23/14 Scheduled  For RE-EXCISION OF RIGHT BREAST INFERIOR MEDIAL MARGIN AND SENTINEL LYMPH NODE BIOPSY 06/05/14 BREAST LUMPECTOMY WITH NEEDLE LOCALIZATION by Dr.Toth  Past/Anticipated interventions by medical oncology, if any: Chemotherapy Onco type ordered  Lymphedema issues, if any: no  Pain issues, if any: nipple soreness  SAFETY ISSUES:  Prior radiation? No  Pacemaker/ICD? NO  Possible current pregnancy?No, Has IUD  Is the patient on methotrexate? NO  Current Complaints / other details:Married with 2 sons ages 61 and 31. GX P2.menarche age 30.First live birth age 69.used IUD x 5 years.No HRT No family h/o breast/gyn cancer Mother:colon cancer Allergies:penicillin, tetracyclines Food allergy:SHELLFISH Diabetic    Arlyss Repress, RN 06/19/2014,10:49 AM

## 2014-06-19 NOTE — Progress Notes (Unsigned)
Ordered oncotype per Dr. Gudena.  Faxed requisition to pathology and confirmed receipt with Christy.  

## 2014-06-21 ENCOUNTER — Encounter: Payer: Self-pay | Admitting: Radiation Oncology

## 2014-06-21 ENCOUNTER — Ambulatory Visit
Admission: RE | Admit: 2014-06-21 | Discharge: 2014-06-21 | Disposition: A | Discharge status: Home / Self Care | Payer: 59 | Source: Ambulatory Visit | Attending: Radiation Oncology | Admitting: Radiation Oncology

## 2014-06-21 ENCOUNTER — Encounter (HOSPITAL_BASED_OUTPATIENT_CLINIC_OR_DEPARTMENT_OTHER): Payer: Self-pay | Admitting: *Deleted

## 2014-06-21 VITALS — BP 140/75 | HR 85 | Temp 98.2°F | Wt 250.2 lb

## 2014-06-21 DIAGNOSIS — C50411 Malignant neoplasm of upper-outer quadrant of right female breast: Secondary | ICD-10-CM

## 2014-06-21 NOTE — Progress Notes (Signed)
   Department of Radiation Oncology  Phone:  [phone] Fax:        [phone]   Name: Sharren Schnurr MRN: [id number]  DOB: 19-Jul-1965  Date: 06/21/2014  Follow Up Visit Note  Diagnosis: Breast cancer of upper-outer quadrant of right female breast   Staging form: Breast, AJCC 7th Edition     Clinical stage from 04/26/2014: Stage 0 (Tis (DCIS), N0, M0) - Unsigned       Staging comments: Staged at breast conference 3.16.16  Interval History: [name] [name] presents today for a routine follow-up. She had a lumpectomy on 06/05/14 which showed a 0.6cm invasive lobular carcinoma in the medial specimen and a 1.7 cm lesion in the lateral specimen. No lymph nodes were taken. Her tumor was ER and PR + and HER2 - with a Ki 67 of 6%. She met with Dr. Lindi Adie and an oncotype has been sent. She is awaiting results. She healed well from surgery with some residual nipple discomfort. The patient is here for discussion of the role of radiation for the management of her disease.  She is scheduled for re-excision and SLNBx on Friday. . She is accompanied by her husband.   Physical Exam:  Filed Vitals:   06/21/14 1517  BP: 140/75  Pulse: 85  Temp: 98.2 F (36.8 C)  Weight: 250 lb 3.2 oz (113.49 kg)   Pleasant female in no distress.  IMPRESSION: [name] [name] is a 49 y.o. female with a T1cN0 invasive lobular cancer and T1bN0 lobular cancer in the same (right) breast  PLAN:  I spoke to the patient today regarding her diagnosis and options for treatment. We discussed the equivalence in terms of survival and local failure between mastectomy and breast conservation. We discussed the role of radiation in decreasing local failures in patients who undergo lumpectomy. We discussed the process of simulation and the placement tattoos. We discussed 6 weeks of treatment as an outpatient. We discussed the possibility of asymptomatic lung damage. We discussed the low likelihood of secondary malignancies. We discussed the possible side  effects including but not limited to skin redness, fatigue, permanent skin darkening, and breast swelling. She signed informed consent and will call to schedule her simulation when she has healed from surgery and heard back from her Oncotype.  (no less than 3 weeks from her surgery).   This document serves as a record of services personally performed by Thea Silversmith, MD. It was created on her behalf by Darcus Austin, a trained medical scribe. The creation of this record is based on the scribe's personal observations and the provider's statements to them. This document has been checked and approved by the attending provider.   Thea Silversmith, MD

## 2014-06-21 NOTE — Progress Notes (Signed)
Please see the Nurse Progress Note in the MD Initial Consult Encounter for this patient. 

## 2014-06-23 ENCOUNTER — Ambulatory Visit (HOSPITAL_BASED_OUTPATIENT_CLINIC_OR_DEPARTMENT_OTHER): Payer: 59 | Admitting: Certified Registered"

## 2014-06-23 ENCOUNTER — Ambulatory Visit (HOSPITAL_BASED_OUTPATIENT_CLINIC_OR_DEPARTMENT_OTHER)
Admission: RE | Admit: 2014-06-23 | Discharge: 2014-06-23 | Disposition: A | Discharge status: Home / Self Care | Payer: 59 | Source: Ambulatory Visit | Attending: General Surgery | Admitting: General Surgery

## 2014-06-23 ENCOUNTER — Encounter (HOSPITAL_BASED_OUTPATIENT_CLINIC_OR_DEPARTMENT_OTHER): Payer: Self-pay | Admitting: Certified Registered"

## 2014-06-23 ENCOUNTER — Ambulatory Visit (HOSPITAL_COMMUNITY)
Admission: RE | Admit: 2014-06-23 | Discharge: 2014-06-23 | Disposition: A | Discharge status: Home / Self Care | Payer: 59 | Source: Ambulatory Visit | Attending: General Surgery | Admitting: General Surgery

## 2014-06-23 ENCOUNTER — Encounter (HOSPITAL_BASED_OUTPATIENT_CLINIC_OR_DEPARTMENT_OTHER)
Admission: RE | Disposition: A | Discharge status: Home / Self Care | Payer: Self-pay | Source: Ambulatory Visit | Attending: General Surgery

## 2014-06-23 DIAGNOSIS — E119 Type 2 diabetes mellitus without complications: Secondary | ICD-10-CM | POA: Diagnosis not present

## 2014-06-23 DIAGNOSIS — Z6838 Body mass index (BMI) 38.0-38.9, adult: Secondary | ICD-10-CM | POA: Insufficient documentation

## 2014-06-23 DIAGNOSIS — C50911 Malignant neoplasm of unspecified site of right female breast: Secondary | ICD-10-CM

## 2014-06-23 DIAGNOSIS — D0511 Intraductal carcinoma in situ of right breast: Secondary | ICD-10-CM | POA: Insufficient documentation

## 2014-06-23 DIAGNOSIS — Z881 Allergy status to other antibiotic agents status: Secondary | ICD-10-CM | POA: Diagnosis not present

## 2014-06-23 DIAGNOSIS — Z880 Allergy status to penicillin: Secondary | ICD-10-CM | POA: Diagnosis not present

## 2014-06-23 HISTORY — PX: RE-EXCISION OF BREAST LUMPECTOMY: SHX6048

## 2014-06-23 HISTORY — PX: AXILLARY SENTINEL NODE BIOPSY: SHX5738

## 2014-06-23 LAB — GLUCOSE, CAPILLARY
GLUCOSE-CAPILLARY: 117 mg/dL — AB (ref 65–99)
GLUCOSE-CAPILLARY: 168 mg/dL — AB (ref 65–99)

## 2014-06-23 SURGERY — EXCISION, LESION, BREAST
Anesthesia: General | Site: Breast | Laterality: Right

## 2014-06-23 MED ORDER — BUPIVACAINE-EPINEPHRINE 0.25% -1:200000 IJ SOLN
INTRAMUSCULAR | Status: DC | PRN
  Administered 2014-06-23: 20 mL

## 2014-06-23 MED ORDER — FENTANYL CITRATE (PF) 100 MCG/2ML IJ SOLN
INTRAMUSCULAR | Status: AC
  Filled 2014-06-23: qty 6

## 2014-06-23 MED ORDER — OXYCODONE HCL 5 MG PO TABS
ORAL_TABLET | ORAL | Status: AC
  Filled 2014-06-23: qty 1

## 2014-06-23 MED ORDER — CHLORHEXIDINE GLUCONATE 4 % EX LIQD: 1.0000 "application " | Freq: Once | CUTANEOUS | Status: DC

## 2014-06-23 MED ORDER — TECHNETIUM TC 99M SULFUR COLLOID FILTERED: 1.0000 | Freq: Once | INTRAVENOUS | Status: AC | PRN

## 2014-06-23 MED ORDER — VANCOMYCIN HCL IN DEXTROSE 1-5 GM/200ML-% IV SOLN
INTRAVENOUS | Status: AC
  Filled 2014-06-23: qty 200

## 2014-06-23 MED ORDER — LIDOCAINE HCL (CARDIAC) 20 MG/ML IV SOLN
INTRAVENOUS | Status: DC | PRN
  Administered 2014-06-23: 30 mg via INTRAVENOUS

## 2014-06-23 MED ORDER — FENTANYL CITRATE (PF) 100 MCG/2ML IJ SOLN
25.0000 ug | INTRAMUSCULAR | Status: DC | PRN
  Administered 2014-06-23: 50 ug via INTRAVENOUS
  Administered 2014-06-23: 0.25 ug via INTRAVENOUS

## 2014-06-23 MED ORDER — FENTANYL CITRATE (PF) 100 MCG/2ML IJ SOLN
INTRAMUSCULAR | Status: DC | PRN
  Administered 2014-06-23 (×4): 25 ug via INTRAVENOUS

## 2014-06-23 MED ORDER — VANCOMYCIN HCL IN DEXTROSE 500-5 MG/100ML-% IV SOLN
INTRAVENOUS | Status: AC
  Filled 2014-06-23: qty 100

## 2014-06-23 MED ORDER — MIDAZOLAM HCL 2 MG/2ML IJ SOLN
INTRAMUSCULAR | Status: AC
  Filled 2014-06-23: qty 2

## 2014-06-23 MED ORDER — HYDROCODONE-ACETAMINOPHEN 5-325 MG PO TABS: 1.0000 | ORAL_TABLET | ORAL | Status: DC | PRN

## 2014-06-23 MED ORDER — MIDAZOLAM HCL 2 MG/2ML IJ SOLN
1.0000 mg | INTRAMUSCULAR | Status: DC | PRN
  Administered 2014-06-23 (×2): 2 mg via INTRAVENOUS

## 2014-06-23 MED ORDER — FENTANYL CITRATE (PF) 100 MCG/2ML IJ SOLN
INTRAMUSCULAR | Status: AC
  Filled 2014-06-23: qty 2

## 2014-06-23 MED ORDER — OXYCODONE HCL 5 MG PO TABS
5.0000 mg | ORAL_TABLET | Freq: Once | ORAL | Status: AC
  Administered 2014-06-23: 5 mg via ORAL

## 2014-06-23 MED ORDER — KETOROLAC TROMETHAMINE 30 MG/ML IJ SOLN: 30.0000 mg | Freq: Once | INTRAMUSCULAR | Status: DC | PRN

## 2014-06-23 MED ORDER — PROMETHAZINE HCL 25 MG/ML IJ SOLN
6.2500 mg | INTRAMUSCULAR | Status: DC | PRN
Start: 2014-06-23 — End: 2014-06-23

## 2014-06-23 MED ORDER — BUPIVACAINE-EPINEPHRINE (PF) 0.25% -1:200000 IJ SOLN
INTRAMUSCULAR | Status: AC
  Filled 2014-06-23: qty 30

## 2014-06-23 MED ORDER — ROPIVACAINE HCL 5 MG/ML IJ SOLN
INTRAMUSCULAR | Status: DC | PRN
  Administered 2014-06-23: 25 mL via PERINEURAL

## 2014-06-23 MED ORDER — VANCOMYCIN HCL 10 G IV SOLR
1500.0000 mg | INTRAVENOUS | Status: AC
  Administered 2014-06-23: 1000 mg via INTRAVENOUS

## 2014-06-23 MED ORDER — DEXAMETHASONE SODIUM PHOSPHATE 4 MG/ML IJ SOLN
INTRAMUSCULAR | Status: DC | PRN
  Administered 2014-06-23: 4 mg via INTRAVENOUS

## 2014-06-23 MED ORDER — GLYCOPYRROLATE 0.2 MG/ML IJ SOLN: 0.2000 mg | Freq: Once | INTRAMUSCULAR | Status: DC | PRN

## 2014-06-23 MED ORDER — LACTATED RINGERS IV SOLN
INTRAVENOUS | Status: DC
Start: 2014-06-23 — End: 2014-06-23
  Administered 2014-06-23 (×2): via INTRAVENOUS

## 2014-06-23 MED ORDER — FENTANYL CITRATE (PF) 100 MCG/2ML IJ SOLN
50.0000 ug | INTRAMUSCULAR | Status: DC | PRN
  Administered 2014-06-23: 100 ug via INTRAVENOUS

## 2014-06-23 MED ORDER — ONDANSETRON HCL 4 MG/2ML IJ SOLN
INTRAMUSCULAR | Status: DC | PRN
  Administered 2014-06-23: 4 mg via INTRAVENOUS

## 2014-06-23 MED ORDER — PROPOFOL 10 MG/ML IV BOLUS
INTRAVENOUS | Status: DC | PRN
  Administered 2014-06-23: 150 mg via INTRAVENOUS

## 2014-06-23 SURGICAL SUPPLY — 42 items
APPLIER CLIP 9.375 MED OPEN (MISCELLANEOUS) ×3
APR CLP MED 9.3 20 MLT OPN (MISCELLANEOUS) ×2
BLADE SURG 15 STRL LF DISP TIS (BLADE) ×2 IMPLANT
BLADE SURG 15 STRL SS (BLADE) ×3
CANISTER SUCT 1200ML W/VALVE (MISCELLANEOUS) ×3 IMPLANT
CHLORAPREP W/TINT 26ML (MISCELLANEOUS) ×3 IMPLANT
CLIP APPLIE 9.375 MED OPEN (MISCELLANEOUS) IMPLANT
CLIP TI WIDE RED SMALL 6 (CLIP) IMPLANT
COVER BACK TABLE 60X90IN (DRAPES) ×3 IMPLANT
COVER MAYO STAND STRL (DRAPES) ×3 IMPLANT
COVER PROBE W GEL 5X96 (DRAPES) ×1 IMPLANT
DECANTER SPIKE VIAL GLASS SM (MISCELLANEOUS) ×2 IMPLANT
DEVICE DUBIN W/COMP PLATE 8390 (MISCELLANEOUS) IMPLANT
DRAPE LAPAROSCOPIC ABDOMINAL (DRAPES) ×3 IMPLANT
DRAPE UTILITY XL STRL (DRAPES) ×3 IMPLANT
ELECT COATED BLADE 2.86 ST (ELECTRODE) ×3 IMPLANT
ELECT REM PT RETURN 9FT ADLT (ELECTROSURGICAL) ×3
ELECTRODE REM PT RTRN 9FT ADLT (ELECTROSURGICAL) ×2 IMPLANT
GLOVE BIO SURGEON STRL SZ7.5 (GLOVE) ×3 IMPLANT
GLOVE BIOGEL PI IND STRL 7.0 (GLOVE) IMPLANT
GLOVE BIOGEL PI INDICATOR 7.0 (GLOVE) ×1
GLOVE ECLIPSE 6.5 STRL STRAW (GLOVE) ×1 IMPLANT
GLOVE EXAM NITRILE EXT CUFF MD (GLOVE) ×1 IMPLANT
GOWN STRL REUS W/ TWL LRG LVL3 (GOWN DISPOSABLE) ×4 IMPLANT
GOWN STRL REUS W/TWL LRG LVL3 (GOWN DISPOSABLE) ×6
LIQUID BAND (GAUZE/BANDAGES/DRESSINGS) ×3 IMPLANT
NDL HYPO 25X1 1.5 SAFETY (NEEDLE) ×2 IMPLANT
NEEDLE HYPO 25X1 1.5 SAFETY (NEEDLE) ×3 IMPLANT
NS IRRIG 1000ML POUR BTL (IV SOLUTION) ×1 IMPLANT
PACK BASIN DAY SURGERY FS (CUSTOM PROCEDURE TRAY) ×3 IMPLANT
PENCIL BUTTON HOLSTER BLD 10FT (ELECTRODE) ×3 IMPLANT
SLEEVE SCD COMPRESS KNEE MED (MISCELLANEOUS) ×3 IMPLANT
SPONGE LAP 18X18 X RAY DECT (DISPOSABLE) ×3 IMPLANT
STAPLER VISISTAT 35W (STAPLE) IMPLANT
SUT MON AB 4-0 PC3 18 (SUTURE) ×4 IMPLANT
SUT SILK 2 0 SH (SUTURE) ×1 IMPLANT
SUT VICRYL 3-0 CR8 SH (SUTURE) ×3 IMPLANT
SYR CONTROL 10ML LL (SYRINGE) ×3 IMPLANT
TOWEL OR 17X24 6PK STRL BLUE (TOWEL DISPOSABLE) ×3 IMPLANT
TOWEL OR NON WOVEN STRL DISP B (DISPOSABLE) ×2 IMPLANT
TUBE CONNECTING 20X1/4 (TUBING) ×3 IMPLANT
YANKAUER SUCT BULB TIP NO VENT (SUCTIONS) ×3 IMPLANT

## 2014-06-23 NOTE — Discharge Instructions (Signed)
°  Post Anesthesia Home Care Instructions ° °Activity: °Get plenty of rest for the remainder of the day. A responsible adult should stay with you for 24 hours following the procedure.  °For the next 24 hours, DO NOT: °-Drive a car °-Operate machinery °-Drink alcoholic beverages °-Take any medication unless instructed by your physician °-Make any legal decisions or sign important papers. ° °Meals: °Start with liquid foods such as gelatin or soup. Progress to regular foods as tolerated. Avoid greasy, spicy, heavy foods. If nausea and/or vomiting occur, drink only clear liquids until the nausea and/or vomiting subsides. Call your physician if vomiting continues. ° °Special Instructions/Symptoms: °Your throat may feel dry or sore from the anesthesia or the breathing tube placed in your throat during surgery. If this causes discomfort, gargle with warm salt water. The discomfort should disappear within 24 hours. ° °If you had a scopolamine patch placed behind your ear for the management of post- operative nausea and/or vomiting: ° °1. The medication in the patch is effective for 72 hours, after which it should be removed.  Wrap patch in a tissue and discard in the trash. Wash hands thoroughly with soap and water. °2. You may remove the patch earlier than 72 hours if you experience unpleasant side effects which may include dry mouth, dizziness or visual disturbances. °3. Avoid touching the patch. Wash your hands with soap and water after contact with the patch. °  °Regional Anesthesia Blocks ° °1. Numbness or the inability to move the "blocked" extremity may last from 3-48 hours after placement. The length of time depends on the medication injected and your individual response to the medication. If the numbness is not going away after 48 hours, call your surgeon. ° °2. The extremity that is blocked will need to be protected until the numbness is gone and the  Strength has returned. Because you cannot feel it, you will need  to take extra care to avoid injury. Because it may be weak, you may have difficulty moving it or using it. You may not know what position it is in without looking at it while the block is in effect. ° °3. For blocks in the legs and feet, returning to weight bearing and walking needs to be done carefully. You will need to wait until the numbness is entirely gone and the strength has returned. You should be able to move your leg and foot normally before you try and bear weight or walk. You will need someone to be with you when you first try to ensure you do not fall and possibly risk injury. ° °4. Bruising and tenderness at the needle site are common side effects and will resolve in a few days. ° °5. Persistent numbness or new problems with movement should be communicated to the surgeon or the  Surgery Center ([phone])/ [facility] ([phone]). °

## 2014-06-23 NOTE — Anesthesia Preprocedure Evaluation (Addendum)
Anesthesia Evaluation  Patient identified by MRN, date of birth, ID band Patient awake    Reviewed: Allergy & Precautions, NPO status , Patient's Chart, lab work & pertinent test results  Airway Mallampati: II  TM Distance: >3 FB Neck ROM: Full    Dental no notable dental hx.    Pulmonary neg pulmonary ROS,  breath sounds clear to auscultation  Pulmonary exam normal       Cardiovascular negative cardio ROS Normal cardiovascular examRhythm:Regular Rate:Normal     Neuro/Psych negative neurological ROS  negative psych ROS   GI/Hepatic negative GI ROS, Neg liver ROS,   Endo/Other  diabetes, Type 2Morbid obesity  Renal/GU negative Renal ROS  negative genitourinary   Musculoskeletal negative musculoskeletal ROS (+)   Abdominal   Peds negative pediatric ROS (+)  Hematology negative hematology ROS (+)   Anesthesia Other Findings   Reproductive/Obstetrics negative OB ROS                           Anesthesia Physical Anesthesia Plan  ASA: II  Anesthesia Plan: General   Post-op Pain Management:    Induction: Intravenous  Airway Management Planned: LMA  Additional Equipment:   Intra-op Plan:   Post-operative Plan: Extubation in OR  Informed Consent: I have reviewed the patients History and Physical, chart, labs and discussed the procedure including the risks, benefits and alternatives for the proposed anesthesia with the patient or authorized representative who has indicated his/her understanding and acceptance.   Dental advisory given  Plan Discussed with: CRNA and Surgeon  Anesthesia Plan Comments:         Anesthesia Quick Evaluation

## 2014-06-23 NOTE — Anesthesia Postprocedure Evaluation (Signed)
  Anesthesia Post-op Note  Patient: [name] [name]  Procedure(s) Performed: Procedure(s) (LRB): RE-EXCISION OF RIGHT BREAST INFERIOR MEDIAL MARGIN AND SENTINEL LYMPH NODE BIOPSY (Right) AXILLARY SENTINEL NODE BIOPSY (Right)  Patient Location: PACU  Anesthesia Type: General  Level of Consciousness: awake and alert   Airway and Oxygen Therapy: Patient Spontanous Breathing  Post-op Pain: mild  Post-op Assessment: Post-op Vital signs reviewed, Patient's Cardiovascular Status Stable, Respiratory Function Stable, Patent Airway and No signs of Nausea or vomiting  Last Vitals:  Filed Vitals:   06/23/14 1045  BP: 141/72  Pulse: 90  Temp:   Resp: 18    Post-op Vital Signs: stable   Complications: No apparent anesthesia complications

## 2014-06-23 NOTE — Anesthesia Procedure Notes (Addendum)
Anesthesia Regional Block:  Pectoralis block  Pre-Anesthetic Checklist: ,, timeout performed, Correct Patient, Correct Site, Correct Laterality, Correct Procedure, Correct Position, site marked, Risks and benefits discussed,  Surgical consent,  Pre-op evaluation,  At surgeon's request and post-op pain management  Laterality: Right  Prep: chloraprep       Needles:  Injection technique: Single-shot  Needle Type: Echogenic Needle     Needle Length: 9cm 9 cm Needle Gauge: 21 and 21 G    Additional Needles:  Procedures: ultrasound guided (picture in chart) Pectoralis block Narrative:  Injection made incrementally with aspirations every 5 mL.  Performed by: Personally   Additional Notes: Patient tolerated the procedure well without complications   Procedure Name: LMA Insertion Date/Time: 06/23/2014 9:10 AM Performed by: Estell Puccini D Pre-anesthesia Checklist: Patient identified, Emergency Drugs available, Suction available and Patient being monitored Patient Re-evaluated:Patient Re-evaluated prior to inductionOxygen Delivery Method: Circle System Utilized Preoxygenation: Pre-oxygenation with 100% oxygen Intubation Type: IV induction Ventilation: Mask ventilation without difficulty LMA: LMA inserted LMA Size: 4.0 Number of attempts: 1 Airway Equipment and Method: Bite block Placement Confirmation: positive ETCO2 Tube secured with: Tape Dental Injury: Teeth and Oropharynx as per pre-operative assessment

## 2014-06-23 NOTE — H&P (Signed)
[name] [name] 05/22/2014 4:27 PM Location: [facility] Patient #: [id number] DOB: 1965/07/29 Married / Language: English / Race: Black or African American Female  History of Present Illness Sammuel [name]. [name] Starks MD; 05/22/2014 4:58 PM) Patient words: discuss breast surgery.  The patient is a 49 year old female who presents for a follow-up for Breast cancer. 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 Her recent MRI showed additional 3 cm of enhancement coming off medially from the previous biopsy. 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 She returns today to talk about options for surgery. She is still very interested in breast conservation.   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  Social History [name] [name], [place]; 05/22/2014 4:28 PM) Alcohol use Occasional alcohol use. Caffeine use Carbonated beverages. No drug use Tobacco use Never smoker.  Family History [name] [name], [place]; 05/22/2014 4:28 PM) Arthritis Father. Colon Cancer Mother. Diabetes Mellitus Mother. Heart Disease Father.  Pregnancy / Birth History [name] [name], [place]; 05/22/2014 4:28 PM) Age at menarche 48 years. Contraceptive History Intrauterine device. Gravida 4 Maternal age 59-30 Para 2 Regular periods  Review of Systems  [name] [name] S. [name] Starks MD; 05/22/2014 4:58 PM) General Not Present- Appetite Loss, Chills, Fatigue, Fever, Night Sweats, Weight Gain and Weight Loss. Skin Not Present- Change in Wart/Mole, Dryness, Hives, Jaundice, New Lesions, Non-Healing Wounds, Rash and Ulcer. HEENT Present- Wears glasses/contact lenses. Not Present- Earache, Hearing Loss, Hoarseness, Nose Bleed, Oral Ulcers, Ringing in the Ears, Seasonal Allergies, Sinus Pain, Sore Throat, Visual Disturbances and Yellow Eyes. Respiratory Not Present- Bloody sputum, Chronic Cough, Difficulty Breathing, Snoring and Wheezing. Breast Not Present- Breast Mass, Breast Pain, Nipple Discharge and Skin Changes. Cardiovascular Not Present- Chest Pain, Difficulty Breathing Lying Down, Leg Cramps, Palpitations, Rapid Heart Rate, Shortness of Breath and Swelling of Extremities. Gastrointestinal Not Present- Abdominal Pain, Bloating, Bloody Stool, Change in Bowel Habits, Chronic diarrhea, Constipation, Difficulty Swallowing, Excessive gas, Gets full quickly at meals, Hemorrhoids, Indigestion, Nausea, Rectal Pain and Vomiting. Female Genitourinary Not Present- Frequency, Nocturia, Painful Urination, Pelvic Pain and Urgency. Musculoskeletal Not Present- Back Pain, Joint Pain, Joint Stiffness, Muscle Pain, Muscle Weakness and Swelling of Extremities. Neurological Not Present- Decreased Memory, Fainting, Headaches, Numbness, Seizures, Tingling, Tremor, Trouble walking and Weakness. Psychiatric Not Present- Anxiety, Bipolar, Change in Sleep Pattern, Depression, Fearful and Frequent crying. Endocrine Present- New Diabetes. Not Present- Cold Intolerance, Excessive Hunger, Hair Changes, Heat Intolerance and Hot flashes. Hematology Not Present- Easy Bruising, Excessive bleeding, Gland problems, HIV and Persistent Infections.   Vitals [employer] R. Brooks CMA; 05/22/2014 4:28 PM) 05/22/2014 4:27 PM Weight: 250.5 lb Height: 67.5in Body Surface Area: 2.33 m Body Mass Index:  38.65 kg/m BP: 132/88 (Sitting, Left Arm, Standard)    Physical Exam [name] [name] S. [name] Starks MD; 05/22/2014 4:58 PM) General Mental Status-Alert. General Appearance-Consistent with stated age. Hydration-Well hydrated. Voice-Normal.  Head and Neck Head-normocephalic, atraumatic with no lesions or palpable  masses. Trachea-midline. Thyroid Gland Characteristics - normal size and consistency.  Eye Eyeball - Bilateral-Extraocular movements intact. Sclera/Conjunctiva - Bilateral-No scleral icterus.  Chest and Lung Exam Chest and lung exam reveals -quiet, even and easy respiratory effort with no use of accessory muscles and on auscultation, normal breath sounds, no adventitious sounds and normal vocal resonance. Inspection Chest [place]ayder - Normal. Back - normal.  Breast Note: There is no palpable mass in either breast. There is no palpable axillary, supraclavicular, or cervical lymphadenopathy.   Cardiovascular Cardiovascular examination reveals -normal heart sounds, regular rate and rhythm with no murmurs and normal pedal pulses bilaterally.  Abdomen Inspection Inspection of the abdomen reveals - No Hernias. Skin - Scar - no surgical scars. Palpation/Percussion Palpation and Percussion of the abdomen reveal - Soft, Non Tender, No Rebound tenderness, No Rigidity (guarding) and No hepatosplenomegaly. Auscultation Auscultation of the abdomen reveals - Bowel sounds normal.  Neurologic Neurologic evaluation reveals -alert and oriented x 3 with no impairment of recent or remote memory. Mental Status-Normal.  Musculoskeletal Normal Exam - Left-Upper Extremity Strength Normal and Lower Extremity Strength Normal. Normal Exam - Right-Upper Extremity Strength Normal and Lower Extremity Strength Normal.  Lymphatic Head & Neck  General Head & Neck Lymphatics: Bilateral - Description - Normal. Axillary  General Axillary Region: Bilateral - Description - Normal.  Tenderness - Non Tender. Femoral & Inguinal  Generalized Femoral & Inguinal Lymphatics: Bilateral - Description - Normal. Tenderness - Non Tender.    Assessment & Plan [name] [name] S. 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 Her recent MRI showed 3 cm of enhancement medial to this that was biopsied and came back as LCIS. I have talked her today about the different options for treatment and I still think she is a very good candidate for breast conservation. She would also like to pursue breast conservation. Instead of localizing the area with a radioactive seed I think she may be best served with a bracketed wire localization. I have discussed with her in detail the risks and benefits of the operation to do this as well as some of the technical aspects and she understands and wishes to proceed   Final path shows invasive lobular cancer with a positive margin. Plan to reexcise margin and map lymph nodes  Signed by Luella Cook, MD (05/22/2014 4:59 PM)

## 2014-06-23 NOTE — Interval H&P Note (Signed)
History and Physical Interval Note:  06/23/2014 8:45 AM  [name] [name]  has presented today for surgery, with the diagnosis of Right Breast Cancer  The various methods of treatment have been discussed with the patient and family. After consideration of risks, benefits and other options for treatment, the patient has consented to  Procedure(s): RE-EXCISION OF RIGHT BREAST INFERIOR MEDIAL MARGIN AND SENTINEL LYMPH NODE BIOPSY (Right) as a surgical intervention .  The patient's history has been reviewed, patient examined, no change in status, stable for surgery.  I have reviewed the patient's chart and labs.  Questions were answered to the patient's satisfaction.     TOTH III,Detria Cummings S

## 2014-06-23 NOTE — Progress Notes (Signed)
Assisted Dr. Rose with right, ultrasound guided, pectoralis block. Side rails up, monitors on throughout procedure. See vital signs in flow sheet. Tolerated Procedure well. 

## 2014-06-23 NOTE — Transfer of Care (Signed)
Immediate Anesthesia Transfer of Care Note  Patient: [name] [name]  Procedure(s) Performed: Procedure(s): RE-EXCISION OF RIGHT BREAST INFERIOR MEDIAL MARGIN AND SENTINEL LYMPH NODE BIOPSY (Right) AXILLARY SENTINEL NODE BIOPSY (Right)  Patient Location: PACU  Anesthesia Type:GA combined with regional for post-op pain  Level of Consciousness: awake, alert , oriented and patient cooperative  Airway & Oxygen Therapy: Patient Spontanous Breathing and Patient connected to face mask oxygen  Post-op Assessment: Report given to RN and Post -op Vital signs reviewed and stable  Post vital signs: Reviewed and stable  Last Vitals:  Filed Vitals:   06/23/14 1032  BP:   Pulse: 104  Temp:   Resp: 15    Complications: No apparent anesthesia complications

## 2014-06-23 NOTE — Progress Notes (Signed)
Radiology staff performed nuc med inj. No additional sedation required. Pt tol well (see VS). Family called to bedside for visit/update/emotional support.

## 2014-06-23 NOTE — Op Note (Signed)
06/23/2014  10:29 AM  PATIENT:  [name] [name]  49 y.o. female  PRE-OPERATIVE DIAGNOSIS:  Right Breast Cancer  POST-OPERATIVE DIAGNOSIS:  Right Breast Cancer  PROCEDURE:  Procedure(s): RE-EXCISION OF medial RIGHT BREAST Cancer INFERIOR MARGIN AND SENTINEL LYMPH NODE BIOPSY (Right) AXILLARY SENTINEL NODE BIOPSY (Right)  SURGEON:  Surgeon(s) and Role:    * Jovita Kussmaul, MD - Primary  PHYSICIAN ASSISTANT:   ASSISTANTS: none   ANESTHESIA:   general  EBL:  Total I/O In: 1000 [I.V.:1000] Out: -   BLOOD ADMINISTERED:none  DRAINS: none   LOCAL MEDICATIONS USED:  MARCAINE     SPECIMEN:  Source of Specimen:  right breast inferior margin and sentinel nodes  DISPOSITION OF SPECIMEN:  PATHOLOGY  COUNTS:  YES  TOURNIQUET:  * No tourniquets in log *  DICTATION: .Dragon Dictation  After informed consent was obtained the patient was brought to the operating room and placed in the supine position on the operating room table. After adequate induction of general anesthesia the patient's right breast and axilla were prepped with ChloraPrep, allowed to dry, and draped in usual sterile manner. Earlier in the day the patient underwent injection of 1 mCi of technetium sulfur colloid in the subareolar position on the right. The neoprobe was then used to identify a hot spot in the right axilla. A small transverse incision was made overlying the hot spot with a 15 blade knife. This incision was carried through the skin and subcutaneous tissue sharply with electrocautery until the axilla was entered. A small vessel was controlled with clips. Using the neoprobe to direct blunt hemostat dissection a hot lymph node was identified. It was excised sharply with electrocautery and the lymphatics were controlled with clips. Ex vivo counts on this lymph node were approximately 4000. There appeared to be a small cluster of possibly 3 lymph nodes in this tissue. This was sent to pathology for further evaluation. No  other hot or palpable lymph nodes were identified in the right axilla. The wound was then infiltrated with quarter percent Marcaine. The area was examined and found to be hemostatic. The deep layer of the wound was closed with interrupted 3-0 Vicryl stitches. The skin was then closed with a running 4-0 Monocryl subcuticular stitch. Attention was then turned to the right breast. The medial cancer had a positive inferior margin. The medial incision was opened sharply with a 15 blade knife until the seroma cavity was entered. The stitches were removed. The inferior edge of the cavity was excised sharply with the electrocautery and Metzenbaum scissors. The specimen was then marked with a short double stitch on the new true inferior surgical margin and a long double stitch on the anterior portion of this margin. The specimen was then sent to pathology for further evaluation. Hemostasis was achieved using the Bovie electrocautery. The wound was irrigated with saline and infiltrated with quarter percent Marcaine. The deep layer of the wound was closed with interrupted 3-0 Vicryl stitches. The skin was then closed with interrupted 4-0 Monocryl subcuticular stitches. Dermabond dressings were applied. The patient tolerated the procedure well. At the end of the case all needle sponge and instrument counts were correct. The patient was then wakened and taken to recovery in stable condition.   PLAN OF CARE: Discharge to home after PACU  PATIENT DISPOSITION:  PACU - hemodynamically stable.   Delay start of Pharmacological VTE agent (>24hrs) due to surgical blood loss or risk of bleeding: not applicable

## 2014-06-26 ENCOUNTER — Encounter (HOSPITAL_BASED_OUTPATIENT_CLINIC_OR_DEPARTMENT_OTHER): Payer: Self-pay | Admitting: General Surgery

## 2014-06-29 ENCOUNTER — Encounter (HOSPITAL_COMMUNITY): Payer: Self-pay

## 2014-06-29 ENCOUNTER — Other Ambulatory Visit: Payer: Self-pay | Admitting: Hematology and Oncology

## 2014-06-29 DIAGNOSIS — C50411 Malignant neoplasm of upper-outer quadrant of right female breast: Secondary | ICD-10-CM

## 2014-07-06 ENCOUNTER — Ambulatory Visit: Admission: RE | Admit: 2014-07-06 | Payer: 59 | Source: Ambulatory Visit | Admitting: Radiation Oncology

## 2014-07-06 ENCOUNTER — Ambulatory Visit: Payer: 59

## 2014-07-12 ENCOUNTER — Ambulatory Visit
Admission: RE | Admit: 2014-07-12 | Discharge: 2014-07-12 | Disposition: A | Discharge status: Home / Self Care | Payer: 59 | Source: Ambulatory Visit | Attending: Radiation Oncology | Admitting: Radiation Oncology

## 2014-07-12 DIAGNOSIS — C50411 Malignant neoplasm of upper-outer quadrant of right female breast: Secondary | ICD-10-CM | POA: Diagnosis not present

## 2014-07-12 NOTE — Progress Notes (Signed)
Name: [name] [name]   MRN: [id number]  Date:  07/12/2014  DOB: Jul 26, 1965  Status:outpatient    DIAGNOSIS: Breast cancer.  CONSENT VERIFIED: yes   SET UP: Patient is setup supine   IMMOBILIZATION:  The following immobilization was used:Custom Moldable Pillow, breast board.   NARRATIVE: [name] was brought to the [facility].  Identity was confirmed.  All relevant records and images related to the planned course of therapy were reviewed.  Then, the patient was positioned in a stable reproducible clinical set-up for radiation therapy.  Wires were placed to delineate the clinical extent of breast tissue. A wire was placed on the scar as well.  CT images were obtained.  An isocenter was placed. Skin markings were placed.  The CT images were loaded into the planning software where the target and avoidance structures were contoured.  The radiation prescription was entered and confirmed. The patient was discharged in stable condition and tolerated simulation well.    TREATMENT PLANNING NOTE:  Treatment planning then occurred. I have requested : MLC's, isodose plan, basic dose calculation  I personally designed and supervised the construction of 3 medically necessary complex treatment devices for the protection of critical normal structures including the lungs and contralateral breast as well as the immobilization device which is necessary for set up certainty.   3D simulation occurred. I requested and analyzed a dose volume histogram of the heart, lungs and lumpectomy cavity.    This document serves as a record of services personally performed by Thea Silversmith, MD. It was created on her behalf by Derek Mound, a trained medical scribe. The creation of this record is based on the scribe's personal observations and the provider's statements to them. This document has been checked and approved by the attending provider.

## 2014-07-12 NOTE — Progress Notes (Signed)
Radiation Oncology         [phone] ________________________________  Name: [name] [name]      MRN: [id number]          Date: 07/12/2014              DOB: [birth year]  Optical Surface Tracking Plan:  Since intensity modulated radiotherapy (IMRT) and 3D conformal radiation treatment methods are predicated on accurate and precise positioning for treatment, intrafraction motion monitoring is medically necessary to ensure accurate and safe treatment delivery.  The ability to quantify intrafraction motion without excessive ionizing radiation dose can only be performed with optical surface tracking. Accordingly, surface imaging offers the opportunity to obtain 3D measurements of patient position throughout IMRT and 3D treatments without excessive radiation exposure.  I am ordering optical surface tracking for this patient's upcoming course of radiotherapy. ________________________________ Signature   Reference:   Ursula Alert, J, et al. Surface imaging-based analysis of intrafraction motion for breast radiotherapy patients.Journal of [place], n. 6, nov. 2014. ISSN [id number].   Available at: <http://www.jacmp.org/index.php/jacmp/article/view/4957>.

## 2014-07-13 NOTE — Assessment & Plan Note (Signed)
Rt Lumpectomy 06/05/14: ILC 0.6 cm (rt Medial) 1.7 cm (Rt Lateral)Margins Neg, Grade 2 with LCIS, ER 100%, PR 99%, Ki 67 6%, Her 2 Neg, Oncotype DX recurrence score 8, risk of recurrence 6%, reexcision for margins benign, 0/3 lymph nodes negative T1b N0 M0 stage IA  I reviewed the Oncotype result in great detail with the patient and provided her with a copy of this report.  Recommendation: 1. Adjuvant radiation therapy followed by 2. Antiestrogen therapy with tamoxifen 20 mg once daily    Return to clinic 1 month after starting antiestrogen therapy to evaluate for toxicities.

## 2014-07-14 ENCOUNTER — Ambulatory Visit (HOSPITAL_BASED_OUTPATIENT_CLINIC_OR_DEPARTMENT_OTHER): Payer: 59 | Admitting: Hematology and Oncology

## 2014-07-14 ENCOUNTER — Telehealth: Payer: Self-pay | Admitting: Hematology and Oncology

## 2014-07-14 VITALS — BP 131/69 | HR 85 | Temp 98.2°F | Resp 18 | Ht 67.5 in | Wt 248.8 lb

## 2014-07-14 DIAGNOSIS — Z170 Estrogen receptor positive status [ER+]: Secondary | ICD-10-CM

## 2014-07-14 DIAGNOSIS — D0501 Lobular carcinoma in situ of right breast: Secondary | ICD-10-CM | POA: Diagnosis not present

## 2014-07-14 DIAGNOSIS — C50411 Malignant neoplasm of upper-outer quadrant of right female breast: Secondary | ICD-10-CM

## 2014-07-14 DIAGNOSIS — D0511 Intraductal carcinoma in situ of right breast: Secondary | ICD-10-CM | POA: Diagnosis not present

## 2014-07-14 MED ORDER — TAMOXIFEN CITRATE 20 MG PO TABS: 20.0000 mg | ORAL_TABLET | Freq: Every day | ORAL | Status: DC

## 2014-07-14 NOTE — Telephone Encounter (Signed)
Appointments made and avs printed for patient °

## 2014-07-14 NOTE — Progress Notes (Signed)
Patient Care Team: Kathyrn Lass, MD as PCP - General (Family Medicine) Autumn Messing III, MD as Consulting Physician (General Surgery) Nicholas Lose, MD as Consulting Physician (Hematology and Oncology) Gery Pray, MD as Consulting Physician (Radiation Oncology) Rockwell Germany, RN as Registered Nurse Mauro Kaufmann, RN as Registered Nurse Holley Bouche, NP as Nurse Practitioner (Nurse Practitioner)  DIAGNOSIS: Breast cancer of upper-outer quadrant of right female breast   Staging form: Breast, AJCC 7th Edition     Clinical stage from 04/26/2014: Stage 0 (Tis (DCIS), N0, M0) - Unsigned       Staging comments: Staged at breast conference 3.16.16    SUMMARY OF ONCOLOGIC HISTORY:   Breast cancer of upper-outer quadrant of right female breast   04/14/2014 Mammogram Right breast upper outer quadrant 7 x 3 mm pleomorphic calcifications   04/14/2014 Initial Diagnosis Right breast needle biopsy: Mammary carcinoma in situ with calcifications at 9 to 10:00 position ER 95%, PR 66%, DCIS plus LCIS, heterogeneous somewhat E Cadherin positive some negative   06/06/2014 Surgery Rt Lumpectomy: ILC 0.6 cm (rt Medial) 1.7 cm (Rt Lateral)Margins Neg, Grade 2 with LCIS, ER 100%, PR 99%, Ki 67 6%, Her 2 Neg   06/23/2014 Surgery Right breast excision: Benign, 0/3 lymph nodes negative, Oncotype DX recurrence score 8, risk of recurrence 6%    CHIEF COMPLIANT: Follow-up to discuss Oncotype DX  INTERVAL HISTORY: [name] [name] is a 49 year old with above-mentioned history of right-sided breast cancer one underwent lumpectomy and we did Oncotype DX testing and she is here today to discuss the results. I called her on the phone and provided her with the order of 8 which translates into risk of recurrence of 6% with tamoxifen therapy. She is super excited to get this result. She is recovered very well from surgery and will hopefully start radiation soon.  REVIEW OF SYSTEMS:   Constitutional: Denies fevers, chills or abnormal  weight loss Eyes: Denies blurriness of vision Ears, nose, mouth, throat, and face: Denies mucositis or sore throat Respiratory: Denies cough, dyspnea or wheezes Cardiovascular: Denies palpitation, chest discomfort or lower extremity swelling Gastrointestinal:  Denies nausea, heartburn or change in bowel habits Skin: Denies abnormal skin rashes Lymphatics: Denies new lymphadenopathy or easy bruising Neurological:Denies numbness, tingling or new weaknesses Behavioral/Psych: Mood is stable, no new changes  Breast:  denies any pain or lumps or nodules in either breasts All other systems were reviewed with the patient and are negative.  I have reviewed the past medical history, past surgical history, social history and family history with the patient and they are unchanged from previous note.  ALLERGIES:  is allergic to food; penicillins; and tetracyclines & related.  MEDICATIONS:  Current Outpatient Prescriptions  Medication Sig Dispense Refill  . HYDROcodone-acetaminophen (NORCO/VICODIN) 5-325 MG per tablet Take 1-2 tablets by mouth every 4 (four) hours as needed for moderate pain or severe pain. 50 tablet 0  . metFORMIN (GLUCOPHAGE) 500 MG tablet Take 500 mg by mouth 2 (two) times daily with a meal.    . oxyCODONE-acetaminophen (ROXICET) 5-325 MG per tablet Take 1-2 tablets by mouth every 4 (four) hours as needed. 50 tablet 0   No current facility-administered medications for this visit.    PHYSICAL EXAMINATION: ECOG PERFORMANCE STATUS: 0 - Asymptomatic  Filed Vitals:   07/14/14 0926  BP: 131/69  Pulse: 85  Temp: 98.2 F (36.8 C)  Resp: 18   Filed Weights   07/14/14 0926  Weight: 248 lb 12.8 oz (112.855 kg)  GENERAL:alert, no distress and comfortable SKIN: skin color, texture, turgor are normal, no rashes or significant lesions EYES: normal, Conjunctiva are pink and non-injected, sclera clear OROPHARYNX:no exudate, no erythema and lips, buccal mucosa, and tongue normal   NECK: supple, thyroid normal size, non-tender, without nodularity LYMPH:  no palpable lymphadenopathy in the cervical, axillary or inguinal LUNGS: clear to auscultation and percussion with normal breathing effort HEART: regular rate & rhythm and no murmurs and no lower extremity edema ABDOMEN:abdomen soft, non-tender and normal bowel sounds Musculoskeletal:no cyanosis of digits and no clubbing  NEURO: alert & oriented x 3 with fluent speech, no focal motor/sensory deficits  LABORATORY DATA:  I have reviewed the data as listed   Chemistry      Component Value Date/Time   NA 140 06/02/2014 1530   NA 144 04/26/2014 1224   K 3.9 06/02/2014 1530   K 4.2 04/26/2014 1224   CL 100 06/02/2014 1530   CO2 30 06/02/2014 1530   CO2 27 04/26/2014 1224   BUN 11 06/02/2014 1530   BUN 14.7 04/26/2014 1224   CREATININE 0.88 06/02/2014 1530   CREATININE 1.0 04/26/2014 1224      Component Value Date/Time   CALCIUM 9.0 06/02/2014 1530   CALCIUM 9.5 04/26/2014 1224   ALKPHOS 71 04/26/2014 1224   AST 12 04/26/2014 1224   ALT 16 04/26/2014 1224   BILITOT 0.28 04/26/2014 1224       Lab Results  Component Value Date   WBC 6.3 04/26/2014   HGB 11.5* 06/05/2014   HCT 36.9 04/26/2014   MCV 84.4 04/26/2014   PLT 336 04/26/2014   NEUTROABS 3.3 04/26/2014   ASSESSMENT & PLAN:  Breast cancer of upper-outer quadrant of right female breast Rt Lumpectomy 06/05/14: ILC 0.6 cm (rt Medial) 1.7 cm (Rt Lateral)Margins Neg, Grade 2 with LCIS, ER 100%, PR 99%, Ki 67 6%, Her 2 Neg, Oncotype DX recurrence score 8, risk of recurrence 6%, reexcision for margins benign, 0/3 lymph nodes negative T1b N0 M0 stage IA  I reviewed the Oncotype result in great detail with the patient and provided her with a copy of this report.  Recommendation: 1. Adjuvant radiation therapy followed by 2. Antiestrogen therapy with tamoxifen 20 mg once daily    Return to clinic 1 month after starting antiestrogen therapy to evaluate  for toxicities. I provided her with a paper prescription for tamoxifen that will start her on August 1 Return to clinic on September 1 week twice his tolerability to treatment   No orders of the defined types were placed in this encounter.   The patient has a good understanding of the overall plan. she agrees with it. she will call with any problems that may develop before the next visit here.   Rulon Eisenmenger, MD

## 2014-07-18 DIAGNOSIS — C50411 Malignant neoplasm of upper-outer quadrant of right female breast: Secondary | ICD-10-CM | POA: Diagnosis not present

## 2014-07-21 ENCOUNTER — Ambulatory Visit
Admission: RE | Admit: 2014-07-21 | Discharge: 2014-07-21 | Disposition: A | Discharge status: Home / Self Care | Payer: 59 | Source: Ambulatory Visit | Attending: Radiation Oncology | Admitting: Radiation Oncology

## 2014-07-21 DIAGNOSIS — C50411 Malignant neoplasm of upper-outer quadrant of right female breast: Secondary | ICD-10-CM | POA: Diagnosis not present

## 2014-07-24 ENCOUNTER — Ambulatory Visit
Admission: RE | Admit: 2014-07-24 | Discharge: 2014-07-24 | Disposition: A | Discharge status: Home / Self Care | Payer: 59 | Source: Ambulatory Visit | Attending: Radiation Oncology | Admitting: Radiation Oncology

## 2014-07-24 DIAGNOSIS — C50411 Malignant neoplasm of upper-outer quadrant of right female breast: Secondary | ICD-10-CM | POA: Diagnosis not present

## 2014-07-24 MED ORDER — ALRA NON-METALLIC DEODORANT (RAD-ONC)
1.0000 "application " | Freq: Once | TOPICAL | Status: AC
  Administered 2014-07-24: 1 via TOPICAL

## 2014-07-24 MED ORDER — RADIAPLEXRX EX GEL
Freq: Once | CUTANEOUS | Status: AC
  Administered 2014-07-24: 14:00:00 via TOPICAL

## 2014-07-25 ENCOUNTER — Ambulatory Visit
Admission: RE | Admit: 2014-07-25 | Discharge: 2014-07-25 | Disposition: A | Discharge status: Home / Self Care | Payer: 59 | Source: Ambulatory Visit | Attending: Radiation Oncology | Admitting: Radiation Oncology

## 2014-07-25 DIAGNOSIS — C50411 Malignant neoplasm of upper-outer quadrant of right female breast: Secondary | ICD-10-CM

## 2014-07-25 NOTE — Progress Notes (Signed)
Weekly Management Note Current Dose: 3.6  Gy  Projected Dose: 61 Gy   Narrative:  The patient presents for routine under treatment assessment.  CBCT/MVCT images/Port film x-rays were reviewed.  The chart was checked. Doing well. No complaints. Skin around nipple is pink.   Physical Findings: Skin around nipple is pink c/w reaction from SLN dye  Impression:  The patient is tolerating radiation.  Plan:  Continue treatment as planned. Start radiaplex.

## 2014-07-26 ENCOUNTER — Telehealth: Payer: Self-pay | Admitting: *Deleted

## 2014-07-26 ENCOUNTER — Ambulatory Visit
Admission: RE | Admit: 2014-07-26 | Discharge: 2014-07-26 | Disposition: A | Discharge status: Home / Self Care | Payer: 59 | Source: Ambulatory Visit | Attending: Radiation Oncology | Admitting: Radiation Oncology

## 2014-07-26 DIAGNOSIS — C50411 Malignant neoplasm of upper-outer quadrant of right female breast: Secondary | ICD-10-CM | POA: Diagnosis not present

## 2014-07-26 NOTE — Telephone Encounter (Signed)
Oncology Nurse Navigator Documentation  Oncology Nurse Navigator Flowsheets 07/26/2014  Navigator Encounter Type Telephone  Patient Visit Type Radonc  Treatment Phase Other  Barriers/Navigation Needs No barriers at this time  Time Spent with Patient 15    Spoke to pt concerning initiation of xrt treatment. Relate doing well. She does have some redness that Dr. [name] is monitoring on her breast. Denies needs at this time. Encourage pt to call with questions or concerns. Received verbal understanding.

## 2014-07-27 ENCOUNTER — Ambulatory Visit
Admission: RE | Admit: 2014-07-27 | Discharge: 2014-07-27 | Disposition: A | Discharge status: Home / Self Care | Payer: 59 | Source: Ambulatory Visit | Attending: Radiation Oncology | Admitting: Radiation Oncology

## 2014-07-27 DIAGNOSIS — C50411 Malignant neoplasm of upper-outer quadrant of right female breast: Secondary | ICD-10-CM | POA: Diagnosis not present

## 2014-07-28 ENCOUNTER — Ambulatory Visit
Admission: RE | Admit: 2014-07-28 | Discharge: 2014-07-28 | Disposition: A | Discharge status: Home / Self Care | Payer: 59 | Source: Ambulatory Visit | Attending: Radiation Oncology | Admitting: Radiation Oncology

## 2014-07-28 DIAGNOSIS — C50411 Malignant neoplasm of upper-outer quadrant of right female breast: Secondary | ICD-10-CM | POA: Diagnosis not present

## 2014-07-28 NOTE — Progress Notes (Signed)
Insurance form updated with claim number [id number] and adjusted time to include patient may work up to 5 hours/4 days per week.

## 2014-07-31 ENCOUNTER — Ambulatory Visit
Admission: RE | Admit: 2014-07-31 | Discharge: 2014-07-31 | Disposition: A | Discharge status: Home / Self Care | Payer: 59 | Source: Ambulatory Visit | Attending: Radiation Oncology | Admitting: Radiation Oncology

## 2014-07-31 DIAGNOSIS — C50411 Malignant neoplasm of upper-outer quadrant of right female breast: Secondary | ICD-10-CM | POA: Diagnosis not present

## 2014-08-01 ENCOUNTER — Ambulatory Visit
Admission: RE | Admit: 2014-08-01 | Discharge: 2014-08-01 | Disposition: A | Discharge status: Home / Self Care | Payer: 59 | Source: Ambulatory Visit | Attending: Radiation Oncology | Admitting: Radiation Oncology

## 2014-08-01 ENCOUNTER — Encounter: Payer: Self-pay | Admitting: Radiation Oncology

## 2014-08-01 VITALS — BP 149/73 | HR 90 | Resp 16 | Wt 253.5 lb

## 2014-08-01 DIAGNOSIS — C50411 Malignant neoplasm of upper-outer quadrant of right female breast: Secondary | ICD-10-CM | POA: Diagnosis not present

## 2014-08-01 NOTE — Progress Notes (Signed)
Weight and vitals stable. Denies pain. Reports hyperpigmentation around right areola remains present. Reports edema of right breast remains present and unchanged. Reports using radiaplex as directed. Denies fatigue.  BP 149/73 mmHg  Pulse 90  Resp 16  Wt 253 lb 8 oz (114.987 kg) Wt Readings from Last 3 Encounters:  08/01/14 253 lb 8 oz (114.987 kg)  07/14/14 248 lb 12.8 oz (112.855 kg)  06/23/14 249 lb (112.946 kg)

## 2014-08-01 NOTE — Progress Notes (Signed)
Weekly Management Note Current Dose:  12.6 Gy  Projected Dose: 61 Gy   Narrative:  The patient presents for routine under treatment assessment.  CBCT/MVCT images/Port film x-rays were reviewed.  The chart was checked. Doing well. Still some pink/dark around nipple due to sentinel lymph node.   Physical Findings: Weight: 253 lb 8 oz (114.987 kg). Unchanged  Impression:  The patient is tolerating radiation.  Plan:  Continue treatment as planned.Continue radiaplex.

## 2014-08-02 ENCOUNTER — Ambulatory Visit
Admission: RE | Admit: 2014-08-02 | Discharge: 2014-08-02 | Disposition: A | Discharge status: Home / Self Care | Payer: 59 | Source: Ambulatory Visit | Attending: Radiation Oncology | Admitting: Radiation Oncology

## 2014-08-02 DIAGNOSIS — C50411 Malignant neoplasm of upper-outer quadrant of right female breast: Secondary | ICD-10-CM | POA: Diagnosis not present

## 2014-08-03 ENCOUNTER — Ambulatory Visit
Admission: RE | Admit: 2014-08-03 | Discharge: 2014-08-03 | Disposition: A | Discharge status: Home / Self Care | Payer: 59 | Source: Ambulatory Visit | Attending: Radiation Oncology | Admitting: Radiation Oncology

## 2014-08-03 DIAGNOSIS — C50411 Malignant neoplasm of upper-outer quadrant of right female breast: Secondary | ICD-10-CM | POA: Diagnosis not present

## 2014-08-04 ENCOUNTER — Ambulatory Visit
Admission: RE | Admit: 2014-08-04 | Discharge: 2014-08-04 | Disposition: A | Discharge status: Home / Self Care | Payer: 59 | Source: Ambulatory Visit | Attending: Radiation Oncology | Admitting: Radiation Oncology

## 2014-08-04 DIAGNOSIS — C50411 Malignant neoplasm of upper-outer quadrant of right female breast: Secondary | ICD-10-CM | POA: Diagnosis not present

## 2014-08-07 ENCOUNTER — Ambulatory Visit
Admission: RE | Admit: 2014-08-07 | Discharge: 2014-08-07 | Disposition: A | Discharge status: Home / Self Care | Payer: 59 | Source: Ambulatory Visit | Attending: Radiation Oncology | Admitting: Radiation Oncology

## 2014-08-07 DIAGNOSIS — C50411 Malignant neoplasm of upper-outer quadrant of right female breast: Secondary | ICD-10-CM | POA: Diagnosis not present

## 2014-08-08 ENCOUNTER — Ambulatory Visit
Admission: RE | Admit: 2014-08-08 | Discharge: 2014-08-08 | Disposition: A | Discharge status: Home / Self Care | Payer: 59 | Source: Ambulatory Visit | Attending: Radiation Oncology | Admitting: Radiation Oncology

## 2014-08-08 VITALS — BP 135/74 | HR 83 | Temp 98.5°F | Wt 254.7 lb

## 2014-08-08 DIAGNOSIS — C50411 Malignant neoplasm of upper-outer quadrant of right female breast: Secondary | ICD-10-CM

## 2014-08-08 NOTE — Progress Notes (Signed)
  Radiation Oncology         [phone]   Name: [name] [name] MRN: [id number]   Date: 08/08/2014  DOB: December 11, 1965   Weekly Radiation Therapy Management    ICD-9-CM ICD-10-CM   1. Breast cancer of upper-outer quadrant of right female breast 174.4 C50.411     Current Dose: 21.6 Gy  Planned Dose:  61 Gy  Narrative The patient presents for routine under treatment assessment. Skin is red. Given another tube of radiaplex. No concerns voiced. Needs documentation for date of ct simulation for insurance.note to be given.  The patient is without complaint. Set-up films were reviewed. The chart was checked.  Physical Findings  weight is 254 lb 11.2 oz (115.531 kg). Her temperature is 98.5 F (36.9 C). Her blood pressure is 135/74 and her pulse is 83. . Weight essentially stable.  No significant changes.  Impression The patient is tolerating radiation.  Plan Continue treatment as planned.     This document serves as a record of services personally performed by Tyler Pita, MD. It was created on his behalf by Arlyce Harman, a trained medical scribe. The creation of this record is based on the scribe's personal observations and the provider's statements to them. This document has been checked and approved by the attending provider.       Sheral Apley Tammi Klippel, M.D.

## 2014-08-08 NOTE — Progress Notes (Signed)
Weekly assessment of radiaplex to right breast.Completed 12 of 33 treatments.Skin is red.Given another tube of radiaplex.No concerns voiced.Needs documentation for date of ct simulation for insurance.note to be given.

## 2014-08-09 ENCOUNTER — Ambulatory Visit
Admission: RE | Admit: 2014-08-09 | Discharge: 2014-08-09 | Disposition: A | Discharge status: Home / Self Care | Payer: 59 | Source: Ambulatory Visit | Attending: Radiation Oncology | Admitting: Radiation Oncology

## 2014-08-09 DIAGNOSIS — C50411 Malignant neoplasm of upper-outer quadrant of right female breast: Secondary | ICD-10-CM | POA: Diagnosis not present

## 2014-08-10 ENCOUNTER — Ambulatory Visit: Payer: 59

## 2014-08-11 ENCOUNTER — Ambulatory Visit
Admission: RE | Admit: 2014-08-11 | Discharge: 2014-08-11 | Disposition: A | Discharge status: Home / Self Care | Payer: 59 | Source: Ambulatory Visit | Attending: Radiation Oncology | Admitting: Radiation Oncology

## 2014-08-11 DIAGNOSIS — C50411 Malignant neoplasm of upper-outer quadrant of right female breast: Secondary | ICD-10-CM | POA: Diagnosis not present

## 2014-08-15 ENCOUNTER — Ambulatory Visit
Admission: RE | Admit: 2014-08-15 | Discharge: 2014-08-15 | Disposition: A | Discharge status: Home / Self Care | Payer: 59 | Source: Ambulatory Visit | Attending: Radiation Oncology | Admitting: Radiation Oncology

## 2014-08-15 VITALS — BP 142/76 | HR 80 | Temp 97.9°F | Wt 254.5 lb

## 2014-08-15 DIAGNOSIS — C50411 Malignant neoplasm of upper-outer quadrant of right female breast: Secondary | ICD-10-CM

## 2014-08-15 NOTE — Progress Notes (Signed)
Weekly Management Note Current Dose: 27  Gy  Projected Dose:61  Gy   Narrative:  The patient presents for routine under treatment assessment.  CBCT/MVCT images/Port film x-rays were reviewed.  The chart was checked. Doing well. No complaints.   Physical Findings: Weight: 254 lb 8 oz (115.44 kg). Slightly dark right breast. Inframammary fold intact.   Impression:  The patient is tolerating radiation.  Plan:  Continue treatment as planned.

## 2014-08-16 ENCOUNTER — Encounter: Payer: Self-pay | Admitting: Radiation Oncology

## 2014-08-16 ENCOUNTER — Ambulatory Visit
Admission: RE | Admit: 2014-08-16 | Discharge: 2014-08-16 | Disposition: A | Discharge status: Home / Self Care | Payer: 59 | Source: Ambulatory Visit | Attending: Radiation Oncology | Admitting: Radiation Oncology

## 2014-08-16 DIAGNOSIS — C50411 Malignant neoplasm of upper-outer quadrant of right female breast: Secondary | ICD-10-CM | POA: Diagnosis not present

## 2014-08-17 ENCOUNTER — Ambulatory Visit
Admission: RE | Admit: 2014-08-17 | Discharge: 2014-08-17 | Disposition: A | Discharge status: Home / Self Care | Payer: 59 | Source: Ambulatory Visit | Attending: Radiation Oncology | Admitting: Radiation Oncology

## 2014-08-17 DIAGNOSIS — C50411 Malignant neoplasm of upper-outer quadrant of right female breast: Secondary | ICD-10-CM | POA: Diagnosis not present

## 2014-08-18 ENCOUNTER — Ambulatory Visit
Admission: RE | Admit: 2014-08-18 | Discharge: 2014-08-18 | Disposition: A | Discharge status: Home / Self Care | Payer: 59 | Source: Ambulatory Visit | Attending: Radiation Oncology | Admitting: Radiation Oncology

## 2014-08-18 DIAGNOSIS — C50411 Malignant neoplasm of upper-outer quadrant of right female breast: Secondary | ICD-10-CM | POA: Diagnosis not present

## 2014-08-21 ENCOUNTER — Ambulatory Visit
Admission: RE | Admit: 2014-08-21 | Discharge: 2014-08-21 | Disposition: A | Discharge status: Home / Self Care | Payer: 59 | Source: Ambulatory Visit | Attending: Radiation Oncology | Admitting: Radiation Oncology

## 2014-08-21 DIAGNOSIS — C50411 Malignant neoplasm of upper-outer quadrant of right female breast: Secondary | ICD-10-CM | POA: Diagnosis not present

## 2014-08-22 ENCOUNTER — Ambulatory Visit: Payer: 59 | Admitting: Radiation Oncology

## 2014-08-22 ENCOUNTER — Ambulatory Visit
Admission: RE | Admit: 2014-08-22 | Discharge: 2014-08-22 | Disposition: A | Discharge status: Home / Self Care | Payer: 59 | Source: Ambulatory Visit | Attending: Radiation Oncology | Admitting: Radiation Oncology

## 2014-08-22 VITALS — BP 145/51 | HR 90 | Temp 98.2°F | Wt 253.2 lb

## 2014-08-22 DIAGNOSIS — C50411 Malignant neoplasm of upper-outer quadrant of right female breast: Secondary | ICD-10-CM | POA: Diagnosis not present

## 2014-08-22 NOTE — Progress Notes (Signed)
Completed 20 of 33 treatments.Marked hyperpigmentation.Denies pain.Hydrocortisone has helped with itching.

## 2014-08-22 NOTE — Progress Notes (Signed)
Weekly Management Note Current Dose: 36  Gy  Projected Dose: 50 Gy   Narrative:  The patient presents for routine under treatment assessment.  CBCT/MVCT images/Port film x-rays were reviewed.  The chart was checked. Doing well. Some hyperpigmentation. No pain.  Physical Findings: Weight: 253 lb 3.2 oz (114.851 kg). Hyperpigmentation over the right breast.  Impression:  The patient is tolerating radiation.  Plan:  Continue treatment as planned. Continue radiaplex.

## 2014-08-23 ENCOUNTER — Ambulatory Visit
Admission: RE | Admit: 2014-08-23 | Discharge: 2014-08-23 | Disposition: A | Discharge status: Home / Self Care | Payer: 59 | Source: Ambulatory Visit | Attending: Radiation Oncology | Admitting: Radiation Oncology

## 2014-08-23 DIAGNOSIS — C50411 Malignant neoplasm of upper-outer quadrant of right female breast: Secondary | ICD-10-CM | POA: Diagnosis not present

## 2014-08-24 ENCOUNTER — Ambulatory Visit
Admission: RE | Admit: 2014-08-24 | Discharge: 2014-08-24 | Disposition: A | Discharge status: Home / Self Care | Payer: 59 | Source: Ambulatory Visit | Attending: Radiation Oncology | Admitting: Radiation Oncology

## 2014-08-24 DIAGNOSIS — C50411 Malignant neoplasm of upper-outer quadrant of right female breast: Secondary | ICD-10-CM | POA: Diagnosis not present

## 2014-08-25 ENCOUNTER — Ambulatory Visit
Admission: RE | Admit: 2014-08-25 | Discharge: 2014-08-25 | Disposition: A | Discharge status: Home / Self Care | Payer: 59 | Source: Ambulatory Visit | Attending: Radiation Oncology | Admitting: Radiation Oncology

## 2014-08-25 DIAGNOSIS — C50411 Malignant neoplasm of upper-outer quadrant of right female breast: Secondary | ICD-10-CM | POA: Diagnosis not present

## 2014-08-27 ENCOUNTER — Ambulatory Visit: Payer: 59

## 2014-08-28 ENCOUNTER — Ambulatory Visit
Admission: RE | Admit: 2014-08-28 | Discharge: 2014-08-28 | Disposition: A | Discharge status: Home / Self Care | Payer: 59 | Source: Ambulatory Visit | Attending: Radiation Oncology | Admitting: Radiation Oncology

## 2014-08-28 ENCOUNTER — Ambulatory Visit: Payer: 59

## 2014-08-28 DIAGNOSIS — C50411 Malignant neoplasm of upper-outer quadrant of right female breast: Secondary | ICD-10-CM | POA: Diagnosis not present

## 2014-08-29 ENCOUNTER — Ambulatory Visit
Admission: RE | Admit: 2014-08-29 | Discharge: 2014-08-29 | Disposition: A | Discharge status: Home / Self Care | Payer: 59 | Source: Ambulatory Visit | Attending: Radiation Oncology | Admitting: Radiation Oncology

## 2014-08-29 ENCOUNTER — Ambulatory Visit: Payer: 59

## 2014-08-29 VITALS — BP 147/79 | HR 79 | Temp 98.5°F | Wt 252.0 lb

## 2014-08-29 DIAGNOSIS — C50411 Malignant neoplasm of upper-outer quadrant of right female breast: Secondary | ICD-10-CM | POA: Diagnosis not present

## 2014-08-29 NOTE — Progress Notes (Signed)
Weekly Management Note Current Dose:  45 Gy  Projected Dose: 61 Gy   Narrative:  The patient presents for routine under treatment assessment.  CBCT/MVCT images/Port film x-rays were reviewed.  The chart was checked. Doing well.  Skin is still irritated. Fatigue significant and asks to be dropped down to 30 hours per week.   Physical Findings: Weight:  . Unchanged. Dark right breast. Skin intact with no moist desquamation.   Impression:  The patient is tolerating radiation.  Plan:  Continue treatment as planned. Letter written for 30 hours per week until September. Start biafene.

## 2014-08-29 NOTE — Progress Notes (Signed)
Weekly assessment of radiation to right breast.Marked hyperpigmentation with no peeling but does itch at times.Changed to biafine.I will fax letter to reduce hours to  [employer] in the morning.

## 2014-08-29 NOTE — Addendum Note (Signed)
Encounter addended by: Norm Salt, RN on: 08/29/2014  5:33 PM<BR>     Documentation filed: Notes Section, Medications, Vitals Section

## 2014-08-30 ENCOUNTER — Ambulatory Visit
Admission: RE | Admit: 2014-08-30 | Discharge: 2014-08-30 | Disposition: A | Discharge status: Home / Self Care | Payer: 59 | Source: Ambulatory Visit | Attending: Radiation Oncology | Admitting: Radiation Oncology

## 2014-08-30 DIAGNOSIS — C50411 Malignant neoplasm of upper-outer quadrant of right female breast: Secondary | ICD-10-CM | POA: Diagnosis not present

## 2014-08-31 ENCOUNTER — Ambulatory Visit
Admission: RE | Admit: 2014-08-31 | Discharge: 2014-08-31 | Disposition: A | Discharge status: Home / Self Care | Payer: 59 | Source: Ambulatory Visit | Attending: Radiation Oncology | Admitting: Radiation Oncology

## 2014-08-31 DIAGNOSIS — C50411 Malignant neoplasm of upper-outer quadrant of right female breast: Secondary | ICD-10-CM | POA: Diagnosis not present

## 2014-09-01 ENCOUNTER — Ambulatory Visit
Admission: RE | Admit: 2014-09-01 | Discharge: 2014-09-01 | Disposition: A | Discharge status: Home / Self Care | Payer: 59 | Source: Ambulatory Visit | Attending: Radiation Oncology | Admitting: Radiation Oncology

## 2014-09-01 DIAGNOSIS — C50411 Malignant neoplasm of upper-outer quadrant of right female breast: Secondary | ICD-10-CM | POA: Diagnosis not present

## 2014-09-04 ENCOUNTER — Ambulatory Visit
Admission: RE | Admit: 2014-09-04 | Discharge: 2014-09-04 | Disposition: A | Discharge status: Home / Self Care | Payer: 59 | Source: Ambulatory Visit | Attending: Radiation Oncology | Admitting: Radiation Oncology

## 2014-09-04 DIAGNOSIS — C50411 Malignant neoplasm of upper-outer quadrant of right female breast: Secondary | ICD-10-CM | POA: Diagnosis not present

## 2014-09-05 ENCOUNTER — Ambulatory Visit
Admission: RE | Admit: 2014-09-05 | Discharge: 2014-09-05 | Disposition: A | Discharge status: Home / Self Care | Payer: 59 | Source: Ambulatory Visit | Attending: Radiation Oncology | Admitting: Radiation Oncology

## 2014-09-05 VITALS — BP 150/88 | HR 82 | Temp 98.4°F | Wt 251.1 lb

## 2014-09-05 DIAGNOSIS — C50411 Malignant neoplasm of upper-outer quadrant of right female breast: Secondary | ICD-10-CM

## 2014-09-05 NOTE — Progress Notes (Signed)
Completed 30 of 33 treatments to right breast.Changed to biafine on last visit and skin has improved.Hyperpigmentation without peeling.Continue biafine 2 to 3 time daily for next 2 to 3 weeks then change to lotion with vitamin e.I will see patient on Friday for skin check and follow up appointment.

## 2014-09-05 NOTE — Progress Notes (Signed)
  Radiation Oncology         [phone] ________________________________  Name: [name] [name] MRN: [id number]  Date: 09/05/2014  DOB: 1965/08/27  Weekly Radiation Therapy Management  T1cN0 invasive lobular cancer and T1bN0 lobular cancer in the same (right) breast  Current Dose: 55 Gy     Planned Dose:  61 Gy  Narrative . . . . . . . . The patient presents for routine under treatment assessment.                                   The patient is without complaint. Changed to biafine on last visit and skin has improved. Hyperpigmentation without peeling. Continue biafine 2 to 3 time daily for next 2 to 3 weeks then change to lotion with vitamin e. Reports some discomfort at night if she lays on the treated side.                                 Set-up films were reviewed.                                 The chart was checked. Physical Findings. . .  weight is 251 lb 1.6 oz (113.898 kg). Her temperature is 98.4 F (36.9 C). Her blood pressure is 150/88 and her pulse is 82. . Weight essentially stable.  No significant changes. Lungs are clear. Heart has regular rhythm and rate. Significant hyperpigmentation in the right breast, no signs of infection. No skin breakdown. Impression . . . . . . . The patient is tolerating radiation. Plan . . . . . . . . . . . . Continue treatment as planned. We will see patient on Friday for skin check and follow up appointment.   This document serves as a record of services personally performed by Gery Pray, MD. It was created on his behalf by Arlyce Harman, a trained medical scribe. The creation of this record is based on the scribe's personal observations and the provider's statements to them. This document has been checked and approved by the attending provider. ________________________________   Blair Promise, PhD, MD

## 2014-09-06 ENCOUNTER — Ambulatory Visit
Admission: RE | Admit: 2014-09-06 | Discharge: 2014-09-06 | Disposition: A | Discharge status: Home / Self Care | Payer: 59 | Source: Ambulatory Visit | Attending: Radiation Oncology | Admitting: Radiation Oncology

## 2014-09-06 DIAGNOSIS — C50411 Malignant neoplasm of upper-outer quadrant of right female breast: Secondary | ICD-10-CM | POA: Diagnosis not present

## 2014-09-07 ENCOUNTER — Ambulatory Visit: Payer: 59

## 2014-09-07 ENCOUNTER — Ambulatory Visit
Admission: RE | Admit: 2014-09-07 | Discharge: 2014-09-07 | Disposition: A | Discharge status: Home / Self Care | Payer: 59 | Source: Ambulatory Visit | Attending: Radiation Oncology | Admitting: Radiation Oncology

## 2014-09-07 DIAGNOSIS — C50411 Malignant neoplasm of upper-outer quadrant of right female breast: Secondary | ICD-10-CM | POA: Diagnosis not present

## 2014-09-08 ENCOUNTER — Ambulatory Visit: Payer: 59

## 2014-09-08 ENCOUNTER — Ambulatory Visit
Admission: RE | Admit: 2014-09-08 | Discharge: 2014-09-08 | Disposition: A | Discharge status: Home / Self Care | Payer: 59 | Source: Ambulatory Visit | Attending: Radiation Oncology | Admitting: Radiation Oncology

## 2014-09-08 ENCOUNTER — Encounter: Payer: Self-pay | Admitting: Radiation Oncology

## 2014-09-08 VITALS — BP 145/86 | HR 80 | Temp 98.6°F | Resp 16 | Ht 67.5 in | Wt 251.1 lb

## 2014-09-08 DIAGNOSIS — Z510 Encounter for antineoplastic radiation therapy: Secondary | ICD-10-CM | POA: Diagnosis present

## 2014-09-08 DIAGNOSIS — C50411 Malignant neoplasm of upper-outer quadrant of right female breast: Secondary | ICD-10-CM | POA: Insufficient documentation

## 2014-09-08 MED ORDER — BIAFINE EX EMUL
CUTANEOUS | Status: DC | PRN
  Administered 2014-09-08: 13:00:00 via TOPICAL

## 2014-09-08 NOTE — Addendum Note (Signed)
Encounter addended by: Norm Salt, RN on: 09/08/2014  1:03 PM<BR>     Documentation filed: [employer], Orders

## 2014-09-08 NOTE — Progress Notes (Signed)
Patient has completed radiation to right breast.Hyperpigmentation of right breast has some lighter areas already since applying biafine.Will give another tube today.denies pain, has mild fatigue.Given script by Dr. Lindi Adie for tamoxifen to start first week of August 2016.Continue biafine for 2 to 3 weeks then apply lotion with vitamin e.

## 2014-09-08 NOTE — Progress Notes (Signed)
  Radiation Oncology         [phone] ________________________________  Name: [name] [name] MRN: [id number]  Date: 09/08/2014  DOB: 10-01-1965  Weekly Radiation Therapy Management    ICD-9-CM ICD-10-CM   1. Breast cancer of upper-outer quadrant of right female breast 174.4 C50.411     Current Dose: 61 Gy     Planned Dose:  61 Gy  Narrative . . . . . . . . The patient presents for routine under treatment assessment.                                   The patient is without complaint except for some mild fatigue. She denies any significant itching or discomfort within the breast. She is happy to complete her radiation therapy today.                                 Set-up films were reviewed.                                 The chart was checked. Physical Findings. . .  height is 5' 7.5" (1.715 m) and weight is 251 lb 1.6 oz (113.898 kg). Her oral temperature is 98.6 F (37 C). Her blood pressure is 145/86 and her pulse is 80. Her respiration is 16. . Weight essentially stable.  The lungs are clear. The heart has a regular rhythm and rate. The right breast area shows significant hyperpigmentation changes but no moist desquamation. Impression . . . . . . . The patient is tolerating radiation. Plan . . . . . . . . . . . . Routine follow-up in approximately one month with Dr. [name]  ________________________________   Blair Promise, PhD, MD

## 2014-09-08 NOTE — Addendum Note (Signed)
Encounter addended by: Norm Salt, RN on: 09/08/2014  1:13 PM<BR>     Documentation filed: Inpatient MAR

## 2014-09-09 ENCOUNTER — Ambulatory Visit: Payer: 59

## 2014-09-11 ENCOUNTER — Telehealth: Payer: Self-pay | Admitting: *Deleted

## 2014-09-11 NOTE — Telephone Encounter (Signed)
Spoke to pt concerning final xrt. Relate doing well and without complaints. Encourage pt to call with questions or needs. Received verbal understanding. Discussed [employer]. Pt relate she is willing to go and start program. Referral made to survivorship clinic.

## 2014-09-24 ENCOUNTER — Encounter: Payer: Self-pay | Admitting: Radiation Oncology

## 2014-09-24 NOTE — Progress Notes (Signed)
  Radiation Oncology         [phone] ________________________________  Name: Jolynne Spurgin MRN: [id number]  Date: 09/08/2014  DOB: 30-Dec-1965  End of Treatment Note  Diagnosis:   Breast cancer of upper-outer quadrant of right female breast   Staging form: Breast, AJCC 7th Edition     Clinical stage from 04/26/2014: Stage 0 (Tis (DCIS), N0, M0) - Unsigned       Staging comments: Staged at breast conference 3.16.16      Indication for treatment: Curative    Radiation treatment dates:  07/24/2014-09/08/2014  Site/dose:    Right breast / 45 Gray @ 1.8 Pearline Cables per fraction x 25 fractions Right breast boost / 16 Gray at [employer] per fraction x 8 fractions  Beams/energy:  Opposed Tangents / 6 MV photons 3 field with 6 MV photons.   Narrative: The patient tolerated radiation treatment relatively well.   She had hyperpigmentation over the breast with no moist desquamation.   Plan: The patient has completed radiation treatment. The patient will return to radiation oncology clinic for routine followup in one month. I advised them to call or return sooner if they have any questions or concerns related to their recovery or treatment.  ------------------------------------------------  Thea Silversmith, MD

## 2014-09-24 NOTE — Progress Notes (Signed)
Name: Dyamond Tolosa   MRN: [id number]  Date:  08/16/2014   DOB: [birth year]  Status:outpatient    DIAGNOSIS: Breast cancer of upper-outer quadrant of right female breast   Staging form: Breast, AJCC 7th Edition     Clinical stage from 04/26/2014: Stage 0 (Tis (DCIS), N0, M0) - Unsigned       Staging comments: Staged at breast conference 3.16.16    CONSENT VERIFIED: yes   SET UP: Patient is setup supine   IMMOBILIZATION:  The following immobilization was used:Custom Moldable Pillow, breast board.   NARRATIVE: Imagene Peatross underwent complex simulation and treatment planning for her boost treatment today.  Her tumor volume was outlined on the planning CT scan.  Due to the depth of her cavity, electrons could not be used and a photon plan was developed. The plan will be prescribed to the  isodose line.   I personally supervised and approved the creation of 3 unique MLCs comprising 3   treatment devices.

## 2014-10-05 ENCOUNTER — Ambulatory Visit
Admission: RE | Admit: 2014-10-05 | Discharge: 2014-10-05 | Disposition: A | Discharge status: Home / Self Care | Payer: 59 | Source: Ambulatory Visit | Attending: Radiation Oncology | Admitting: Radiation Oncology

## 2014-10-05 VITALS — BP 147/68 | HR 66 | Temp 98.5°F | Wt 249.0 lb

## 2014-10-05 DIAGNOSIS — C50411 Malignant neoplasm of upper-outer quadrant of right female breast: Secondary | ICD-10-CM

## 2014-10-05 NOTE — Progress Notes (Signed)
   Department of Radiation Oncology  Phone:  [phone] Fax:        [phone]   Name: [name] [name] MRN: [id number]  DOB: May 08, 1965  Date: 10/05/2014  Follow Up Visit Note  Diagnosis: Breast cancer of upper-outer quadrant of right female breast   Staging form: Breast, AJCC 7th Edition     Clinical stage from 04/26/2014: Stage 0 (Tis (DCIS), N0, M0) - Unsigned       Staging comments: Staged at breast conference 3.16.16  Summary and Interval since last radiation:  1 month. 07/24/2014-09/08/2014 Site/dose:    Right breast / 45 Gray @ 1.8 Pearline Cables per fraction x 25 fractions Right breast boost / 16 Gray at [employer] per fraction x 8 fractions  Interval History: [name] [name] presents today for routine followup. She reports hot flashes while on the Tamoxifen. Using cocoa butter and vitamin E on her skin. She believes that this is working well. She is interested in joining the [employer] and losing weight. F/u with Dr. Lindi Adie in September.  Physical Exam:  Filed Vitals:   10/05/14 0851  BP: 147/68  Pulse: 66  Temp: 98.5 F (36.9 C)  Weight: 249 lb (112.946 kg)  Alert and oriented times three. Pt is in no distress. Hyperpigmentation of the right breast.  IMPRESSION: [name] [name] is a 49 y.o. 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 We discussed the need for follow up every 4-6 months which she has scheduled.  We discussed the need for yearly mammograms which she can schedule with her OBGYN or with medical oncology. We discussed the need for sun protection in the treated area.  She can always call me with questions.  I will follow up with her on an as needed basis. I encouraged the pt to continue physical activity. She has a f/u with Dr. Lindi Adie in September. Thea Silversmith, MD This document serves as a record of services personally performed by Thea Silversmith, MD. It was created on her behalf by Darcus Austin, a trained medical scribe. The creation of this record is based  on the scribe's personal observations and the provider's statements to them. This document has been checked and approved by the attending provider.

## 2014-10-10 ENCOUNTER — Other Ambulatory Visit: Payer: Self-pay | Admitting: Nurse Practitioner

## 2014-10-10 DIAGNOSIS — C50411 Malignant neoplasm of upper-outer quadrant of right female breast: Secondary | ICD-10-CM

## 2014-10-13 ENCOUNTER — Ambulatory Visit (HOSPITAL_BASED_OUTPATIENT_CLINIC_OR_DEPARTMENT_OTHER): Payer: 59 | Admitting: Hematology and Oncology

## 2014-10-13 ENCOUNTER — Other Ambulatory Visit: Payer: Self-pay | Admitting: *Deleted

## 2014-10-13 ENCOUNTER — Telehealth: Payer: Self-pay | Admitting: Hematology and Oncology

## 2014-10-13 ENCOUNTER — Encounter: Payer: Self-pay | Admitting: Hematology and Oncology

## 2014-10-13 VITALS — BP 136/74 | HR 85 | Temp 98.4°F | Resp 18 | Ht 67.5 in | Wt 247.7 lb

## 2014-10-13 DIAGNOSIS — Z170 Estrogen receptor positive status [ER+]: Secondary | ICD-10-CM

## 2014-10-13 DIAGNOSIS — N951 Menopausal and female climacteric states: Secondary | ICD-10-CM | POA: Diagnosis not present

## 2014-10-13 DIAGNOSIS — C50411 Malignant neoplasm of upper-outer quadrant of right female breast: Secondary | ICD-10-CM | POA: Diagnosis not present

## 2014-10-13 NOTE — Assessment & Plan Note (Signed)
Rt Lumpectomy 06/05/14: ILC 0.6 cm (rt Medial) 1.7 cm (Rt Lateral)Margins Neg, Grade 2 with LCIS, ER 100%, PR 99%, Ki 67 6%, Her 2 Neg, Oncotype DX recurrence score 8, risk of recurrence 6%, reexcision for margins benign, 0/3 lymph nodes negative T1b N0 M0 stage IA status post adjuvant radiation 07/24/2014 to 09/08/2014  Current treatment: Adjuvant antiestrogen therapy with tamoxifen 20 mg daily started 09/25/2014 5-10 years Tamoxifen toxicities:   Breast cancer surveillance: 1. Every 4-6 month breast exams 2. Annual mammograms  Return to clinic in 6 months for follow-up

## 2014-10-13 NOTE — Telephone Encounter (Signed)
Gave avs & calendar for September & December. °

## 2014-10-13 NOTE — Progress Notes (Signed)
Patient Care Team: Kathyrn Lass, MD as PCP - General (Family Medicine) Autumn Messing III, MD as Consulting Physician (General Surgery) Nicholas Lose, MD as Consulting Physician (Hematology and Oncology) Gery Pray, MD as Consulting Physician (Radiation Oncology) Rockwell Germany, RN as Registered Nurse Mauro Kaufmann, RN as Registered Nurse Holley Bouche, NP as Nurse Practitioner (Nurse Practitioner)  DIAGNOSIS: Breast cancer of upper-outer quadrant of right female breast   Staging form: Breast, AJCC 7th Edition     Clinical stage from 04/26/2014: Stage 0 (Tis (DCIS), N0, M0) - Unsigned       Staging comments: Staged at breast conference 3.16.16    SUMMARY OF ONCOLOGIC HISTORY:   Breast cancer of upper-outer quadrant of right female breast   04/14/2014 Mammogram Right breast upper outer quadrant 7 x 3 mm pleomorphic calcifications   04/14/2014 Initial Diagnosis Right breast needle biopsy: Mammary carcinoma in situ with calcifications at 9 to 10:00 position ER 95%, PR 66%, DCIS plus LCIS, heterogeneous somewhat E Cadherin positive some negative   06/06/2014 Surgery Rt Lumpectomy: ILC 0.6 cm (rt Medial) 1.7 cm (Rt Lateral)Margins Neg, Grade 2 with LCIS, ER 100%, PR 99%, Ki 67 6%, Her 2 Neg   06/23/2014 Surgery Right breast excision: Benign, 0/3 lymph nodes negative, Oncotype DX recurrence score 8, risk of recurrence 6%   07/24/2014 - 09/08/2014 Radiation Therapy Adjuvant radiation therapy with boost   09/25/2014 -  Anti-estrogen oral therapy Tamoxifen 20 mg daily    CHIEF COMPLIANT: Follow-up on tamoxifen therapy  INTERVAL HISTORY: Starbuck (spelled as Renae)  is a 49 year old lady with above-mentioned history of right-sided breast cancer who is currently on tamoxifen therapy and is tolerating it fairly well. She does complain of hot flashes and night sweats. However she is able to manage them fairly well. She finished with radiation and appears to be healing well. She is slightly concerned about  the skin discoloration of the breast as well as if it causes her breast to reduce in size significantly.  REVIEW OF SYSTEMS:   Constitutional: Denies fevers, chills or abnormal weight loss Eyes: Denies blurriness of vision Ears, nose, mouth, throat, and face: Denies mucositis or sore throat Respiratory: Denies cough, dyspnea or wheezes Cardiovascular: Denies palpitation, chest discomfort or lower extremity swelling Gastrointestinal:  Denies nausea, heartburn or change in bowel habits Skin: Denies abnormal skin rashes Lymphatics: Denies new lymphadenopathy or easy bruising Neurological:Denies numbness, tingling or new weaknesses Behavioral/Psych: Mood is stable, no new changes  Breast: Skin discoloration from radiation  All other systems were reviewed with the patient and are negative.  I have reviewed the past medical history, past surgical history, social history and family history with the patient and they are unchanged from previous note.  ALLERGIES:  is allergic to food; penicillins; and tetracyclines & related.  MEDICATIONS:  Current Outpatient Prescriptions  Medication Sig Dispense Refill  . metFORMIN (GLUCOPHAGE) 500 MG tablet Take 500 mg by mouth 2 (two) times daily with a meal.    . oxyCODONE-acetaminophen (ROXICET) 5-325 MG per tablet Take 1-2 tablets by mouth every 4 (four) hours as needed. 50 tablet 0  . tamoxifen (NOLVADEX) 20 MG tablet Take 1 tablet (20 mg total) by mouth daily. Start Aug 1st week 90 tablet 3   No current facility-administered medications for this visit.    PHYSICAL EXAMINATION: ECOG PERFORMANCE STATUS: 1 - Symptomatic but completely ambulatory  Filed Vitals:   10/13/14 1204  BP: 136/74  Pulse: 85  Temp: 98.4 F (36.9 C)  Resp: 18   Filed Weights   10/13/14 1204  Weight: 247 lb 11.2 oz (112.356 kg)    GENERAL:alert, no distress and comfortable SKIN: skin color, texture, turgor are normal, no rashes or significant lesions EYES: normal,  Conjunctiva are pink and non-injected, sclera clear OROPHARYNX:no exudate, no erythema and lips, buccal mucosa, and tongue normal  NECK: supple, thyroid normal size, non-tender, without nodularity LYMPH:  no palpable lymphadenopathy in the cervical, axillary or inguinal LUNGS: clear to auscultation and percussion with normal breathing effort HEART: regular rate & rhythm and no murmurs and no lower extremity edema ABDOMEN:abdomen soft, non-tender and normal bowel sounds Musculoskeletal:no cyanosis of digits and no clubbing  NEURO: alert & oriented x 3 with fluent speech, no focal motor/sensory deficits  LABORATORY DATA:  I have reviewed the data as listed   Chemistry      Component Value Date/Time   NA 140 06/02/2014 1530   NA 144 04/26/2014 1224   K 3.9 06/02/2014 1530   K 4.2 04/26/2014 1224   CL 100 06/02/2014 1530   CO2 30 06/02/2014 1530   CO2 27 04/26/2014 1224   BUN 11 06/02/2014 1530   BUN 14.7 04/26/2014 1224   CREATININE 0.88 06/02/2014 1530   CREATININE 1.0 04/26/2014 1224      Component Value Date/Time   CALCIUM 9.0 06/02/2014 1530   CALCIUM 9.5 04/26/2014 1224   ALKPHOS 71 04/26/2014 1224   AST 12 04/26/2014 1224   ALT 16 04/26/2014 1224   BILITOT 0.28 04/26/2014 1224       Lab Results  Component Value Date   WBC 6.3 04/26/2014   HGB 11.5* 06/05/2014   HCT 36.9 04/26/2014   MCV 84.4 04/26/2014   PLT 336 04/26/2014   NEUTROABS 3.3 04/26/2014   ASSESSMENT & PLAN:  Breast cancer of upper-outer quadrant of right female breast Rt Lumpectomy 06/05/14: ILC 0.6 cm (rt Medial) 1.7 cm (Rt Lateral)Margins Neg, Grade 2 with LCIS, ER 100%, PR 99%, Ki 67 6%, Her 2 Neg, Oncotype DX recurrence score 8, risk of recurrence 6%, reexcision for margins benign, 0/3 lymph nodes negative T1b N0 M0 stage IA status post adjuvant radiation 07/24/2014 to 09/08/2014  Current treatment: Adjuvant antiestrogen therapy with tamoxifen 20 mg daily started 09/25/2014 5-10  years  Tamoxifen toxicities: 1. Hot flashes occasional sweats  Breast cancer surveillance: 1. Every 4-6 month breast exams 2. Annual mammograms  Patient had extensive family members with breast and other cancers. Given her young age, I will refer her for genetic counseling.  Return to clinic in 3 months for follow-up  No orders of the defined types were placed in this encounter.   The patient has a good understanding of the overall plan. she agrees with it. she will call with any problems that may develop before the next visit here.   Rulon Eisenmenger, MD

## 2014-10-19 ENCOUNTER — Other Ambulatory Visit: Payer: Self-pay

## 2014-10-19 DIAGNOSIS — C50411 Malignant neoplasm of upper-outer quadrant of right female breast: Secondary | ICD-10-CM

## 2014-10-19 MED ORDER — TAMOXIFEN CITRATE 20 MG PO TABS: 20.0000 mg | ORAL_TABLET | Freq: Every day | ORAL | Status: DC

## 2014-11-02 ENCOUNTER — Other Ambulatory Visit: Payer: 59

## 2014-11-02 ENCOUNTER — Encounter: Payer: Self-pay | Admitting: Genetic Counselor

## 2014-11-02 ENCOUNTER — Ambulatory Visit (HOSPITAL_BASED_OUTPATIENT_CLINIC_OR_DEPARTMENT_OTHER): Payer: 59 | Admitting: Genetic Counselor

## 2014-11-02 DIAGNOSIS — Z809 Family history of malignant neoplasm, unspecified: Secondary | ICD-10-CM

## 2014-11-02 DIAGNOSIS — C50411 Malignant neoplasm of upper-outer quadrant of right female breast: Secondary | ICD-10-CM | POA: Diagnosis not present

## 2014-11-02 DIAGNOSIS — Z803 Family history of malignant neoplasm of breast: Secondary | ICD-10-CM | POA: Diagnosis not present

## 2014-11-02 DIAGNOSIS — Z800 Family history of malignant neoplasm of digestive organs: Secondary | ICD-10-CM

## 2014-11-02 NOTE — Progress Notes (Signed)
REFERRING PROVIDER: Nicholas Lose, MD  PRIMARY PROVIDER:  Tawanna Solo, MD  PRIMARY REASON FOR VISIT:  1. Breast cancer of upper-outer quadrant of right female breast   2. Family history of breast cancer in female   3. Family history of colon cancer in mother   65. Family history of cancer      HISTORY OF PRESENT ILLNESS:   [name] [name], a 49 y.o. female, was seen for a [facility] cancer genetics consultation at the request of Dr. Lindi Adie due to a personal history of breast cancer at 65 and family history of breast and other cancers.  [name] [name] presents to clinic today to discuss the possibility of a hereditary predisposition to cancer, genetic testing, and to further clarify her future cancer risks, as well as potential cancer risks for family members.   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 This was treated with lumpectomy, re-excision, radiation, and tamoxifen.    CANCER HISTORY:  Oncology History   2/26/201     Breast cancer of upper-outer quadrant of right female breast   04/07/2014 Mammogram Right breast: calcifications    04/14/2014 Initial Biopsy Right breast needle biopsy: Mammary carcinoma in situ with calcifications at 9 to 10:00 position ER+ (95%), PR+ (66%), DCIS plus LCIS, heterogeneous somewhat E Cadherin positive some negative   04/26/2014 Breast MRI Biopsy-proven DCIS is identified in the outer quadrant; ~ 3 cm AP diameter linear clumped NME extends from the medial biopsy cavity posteriorly in the central breast which is suspicious for disease extension   04/26/2014 Clinical Stage Stage 0: Tis Nx   06/05/2014 Definitive Surgery Right lumpectomy [name]): ILC, grade 2/3, 0.6 cm, LCIS invasive ca focally present at inferior margin (rt Medial); ILC, grade 2-3, 1.7 cm with neg margins with LCIS (Rt Lateral) ER 100%, PR 99%, Ki 67 6%, HER2/neu neg (ratio 1.14)   06/05/2014 Oncotype testing Score: 8 (ROR  6%)   06/05/2014 Pathologic Stage Stage IA: mpT1c pNx   06/23/2014 Surgery Right breast excision: Benign, 0/3 lymph nodes negative   07/24/2014 - 09/08/2014 Radiation Therapy Adjuvant RT completed [name]): Right breast 45 Gy over 25 fractions.  Right breast boost 16 Gy over 8 fractions.  Total dose: 60 Gy.     09/25/2014 -  Anti-estrogen oral therapy Tamoxifen 20 mg daily ([place]).  Planned duration of therapy 5-10 years.   10/2014 Procedure Genetics counseling / testing: pending (appointment 11/02/2014)     HORMONAL RISK FACTORS:  Menarche was at age 65.  First live birth at age 72.  OCP use -  (IUD) for approximately 10 years.  Ovaries intact: yes.  Hysterectomy: no.  Menopausal status: premenopausal.  HRT use: 0 years. Colonoscopy: no; not examined. Mammogram within the last year: yes. Number of breast biopsies: 2. Up to date with pelvic exams:  yes. Any excessive radiation exposure in the past:  no  Past Medical History  Diagnosis Date  . Diabetes mellitus without complication   . Breast cancer   . Radiation 07/24/14-09/08/14    Right upper breast    Past Surgical History  Procedure Laterality Date  . Breast lumpectomy with needle localization Right 06/05/2014    Procedure: RIGHT BREAST LUMPECTOMY WITH NEEDLE LOCALIZATION;  Surgeon: Autumn Messing III, MD;  Location: [facility];  Service: General;  Laterality: Right;  . Re-excision of breast lumpectomy Right 06/23/2014    Procedure: RE-EXCISION OF RIGHT BREAST INFERIOR  MEDIAL MARGIN AND SENTINEL LYMPH NODE BIOPSY;  Surgeon: Autumn Messing III, MD;  Location: [facility];  Service: General;  Laterality: Right;  . Axillary sentinel node biopsy Right 06/23/2014    Procedure: AXILLARY SENTINEL NODE BIOPSY;  Surgeon: Autumn Messing III, MD;  Location: [facility];  Service: General;  Laterality: Right;    Social History   Social History  . Marital Status: Married    Spouse Name: N/A  . Number of  Children: 2  . Years of Education: N/A   Social History Main Topics  . Smoking status: Never Smoker   . Smokeless tobacco: Never Used  . Alcohol Use: No     Comment: maybe occasionally on holidays  . Drug Use: No  . Sexual Activity: Not Asked   Other Topics Concern  . None   Social History Narrative     FAMILY HISTORY:  We obtained a detailed, 4-generation family history.  Significant diagnoses are listed below: Family History  Problem Relation Age of Onset  . Colon cancer Mother     dx. late 23s  . Congestive Heart Failure Father     smoker  . Cancer Maternal Aunt     unspecified type; dx. 70s  . Heart attack Maternal Uncle   . Breast cancer Paternal Aunt     dx. 9s  . Heart attack Paternal Uncle   . Heart attack Paternal Grandmother   . Heart attack Paternal Grandfather   . Congestive Heart Failure Paternal Aunt   . Cancer Cousin     unspecified type/unspecified type  . Breast cancer Cousin 72    [name] [name] has two sons, ages 15 and 69.  She has one full brother who is currently 62 and who has never had cancer.  He has no children.  [name] [name] mother passed away with colon cancer in her late 72s.  [name] [name] father died in his early 27s with congestive heart failure.    [name] [name] father had two full sisters and three full brothers.  One sister was diagnosed and passed away with breast cancer in her 79s.  She had one son who has not had cancer.  [name] [name] other paternal aunt was never diagnosed with cancer and she died of congestive heart failure later in life.  Two paternal uncles died in their late 83s, but never had cancer.  One of these brothers had 8 children, one of whom (a daughter) was diagnosed with an unspecified type of cancer and died in her late 53s.  She had one son and one daughter; her daughter was recently diagnosed with breast cancer at 28.  The third paternal uncle died of a heart attack in his 37s.  He had 7 children, one of whom was also diagnosed with  an unspecified type of cancer and died in her late 62s-60s.  [name] [name] paternal grandmother died of a heart attack in her 105s and her paternal grandfather died of a heart attack in his 16s.   [name] [name] mother had one full sister and two full brothers.  [name] [name] maternal aunt was diagnosed with an unspecified type of cancer in her 14s and passed away in her late 1s.  She had three sons and two daughters, none of whom have had cancer.  Both of [name] [name]'s maternal uncles died of heart attacks in their 75s-80s.  [name] [name] maternal grandparents passed away in their late 86s-80s.  She is unaware of cancer diagnoses in any  other family members; she does not believe anyone has had genetic testing.   Patient's maternal ancestors are of African American/Caucasian descent, and paternal ancestors are of German/Ethiopian/Native American-[place] descent. There is no reported Ashkenazi Jewish ancestry. There is no known consanguinity.  GENETIC COUNSELING ASSESSMENT: [name] [name] is a 49 y.o. female with a personal and family history of cancer which is somewhat suggestive of a hereditary cancer syndrome and predisposition to cancer. We, therefore, discussed and recommended the following at today's visit.   DISCUSSION: We reviewed the characteristics, features and inheritance patterns of hereditary cancer syndromes, particularly those caused by mutations within the BRCA1/2 and Lynch syndrome genes. We also discussed genetic testing, including the appropriate family members to test, the process of testing, insurance coverage and turn-around-time for results. We discussed the implications of a negative, positive and/or variant of uncertain significant result. We recommended [name] [name] pursue genetic testing for the 20-gene Breast/Ovarian Cancer Panel through [facility] Hope Pigeon, MD).  The Breast/Ovarian Cancer Panel offered by [facility] includes sequencing and deletion/duplication analysis for the following 19  genes:  ATM, BARD1, BRCA1, BRCA2, BRIP1, CDH1, CHEK2, FANCC, MLH1, MSH2, MSH6, NBN, PALB2, PMS2, PTEN, RAD51C, RAD51D, TP53, and XRCC2.  This panel also includes deletion/duplication analysis (without sequencing) for one gene, EPCAM.  Based on [name] [name] personal and family history of cancer, she meets medical criteria for genetic testing. Despite that she meets criteria, she may still have an out of pocket cost. We discussed that if her out of pocket cost for testing is over $100, the laboratory will call and confirm whether she wants to proceed with testing.  If the out of pocket cost of testing is less than $100 she will be billed by the genetic testing laboratory.   PLAN: After considering the risks, benefits, and limitations, [name] [name]  provided informed consent to pursue genetic testing and the blood Kaleea Penner was sent to [facility] for analysis of the 20-gene Breast/Ovarian Cancer Panel test. Results should be available within approximately 2-3 weeks' time, at which point they will be disclosed by telephone to [name] [name], as will any additional recommendations warranted by these results. Ms. Linney will receive a summary of her genetic counseling visit and a copy of her results once available. This information will also be available in Epic. We encouraged Ms. Dettinger to remain in contact with cancer genetics annually so that we can continuously update the family history and inform her of any changes in cancer genetics and testing that may be of benefit for her family. Ms. Mione questions were answered to her satisfaction today. Our contact information was provided should additional questions or concerns arise.  Thank you for the referral and allowing [place] to share in the care of your patient.   Jeanine Luz, MS Genetic Counselor [email or web address] Phone: [phone]  The patient was seen for a total of 45 minutes in face-to-face genetic counseling.  This patient was discussed with Drs.  Magrinat, Lindi Adie and/or Burr Medico who agrees with the above.    _______________________________________________________________________ For Office Staff:  Number of people involved in session: 1 Was an Intern/ student involved with case: no

## 2014-11-03 ENCOUNTER — Encounter: Payer: Self-pay | Admitting: Nurse Practitioner

## 2014-11-03 ENCOUNTER — Ambulatory Visit: Payer: 59 | Admitting: Hematology and Oncology

## 2014-11-03 ENCOUNTER — Ambulatory Visit (HOSPITAL_BASED_OUTPATIENT_CLINIC_OR_DEPARTMENT_OTHER): Payer: 59 | Admitting: Nurse Practitioner

## 2014-11-03 VITALS — BP 124/67 | HR 81 | Temp 97.7°F | Resp 18 | Ht 67.5 in | Wt 250.3 lb

## 2014-11-03 DIAGNOSIS — C50411 Malignant neoplasm of upper-outer quadrant of right female breast: Secondary | ICD-10-CM | POA: Diagnosis not present

## 2014-11-03 NOTE — Progress Notes (Signed)
CLINIC:  Cancer Survivorship   REASON FOR VISIT:  Routine follow-up post-treatment for a recent history of breast cancer.  BRIEF ONCOLOGIC HISTORY:  Oncology History   2/26/201     Breast cancer of upper-outer quadrant of right female breast   04/07/2014 Mammogram Right breast: calcifications    04/14/2014 Initial Biopsy Right breast needle biopsy: Mammary carcinoma in situ with calcifications at 9 to 10:00 position ER+ (95%), PR+ (66%), DCIS plus LCIS, heterogeneous somewhat E Cadherin positive some negative   04/26/2014 Breast MRI Biopsy-proven DCIS is identified in the outer quadrant; ~ 3 cm AP diameter linear clumped NME extends from the medial biopsy cavity posteriorly in the central breast which is suspicious for disease extension   04/26/2014 Clinical Stage Stage 0: Tis Nx   06/05/2014 Definitive Surgery Right lumpectomy [name]): ILC, grade 2/3, 0.6 cm, LCIS invasive ca focally present at inferior margin (rt Medial); ILC, grade 2-3, 1.7 cm with neg margins with LCIS (Rt Lateral) ER 100%, PR 99%, Ki 67 6%, HER2/neu neg (ratio 1.14)   06/05/2014 Oncotype testing Score: 8 (ROR 6%)   06/05/2014 Pathologic Stage Stage IA: mpT1c pNx   06/23/2014 Surgery Right breast excision: Benign, 0/3 lymph nodes negative   07/24/2014 - 09/08/2014 Radiation Therapy Adjuvant RT completed [name]): Right breast 45 Gy over 25 fractions.  Right breast boost 16 Gy over 8 fractions.  Total dose: 60 Gy.     09/25/2014 -  Anti-estrogen oral therapy Tamoxifen 20 mg daily ([place]).  Planned duration of therapy 5-10 years.   10/2014 Procedure Genetics counseling / testing: pending (appointment 11/02/2014)    INTERVAL HISTORY:  [name] [name] presents to the [facility] today for our initial meeting to review her survivorship care plan detailing her treatment course for breast cancer, as well as monitoring long-term side effects of that treatment, education regarding health maintenance, screening, and overall wellness  and health promotion.     Overall, [name] [name] reports feeling quite well since completing her radiation therapy approximately two months ago.  She reports that her skin remains darkened over her right breast, but she denies any breakdown. She remains fatigued, but states she is able to accomplish her daily activities.  She has begun her tamoxifen and reports minimal hot flashes, but that they are not bothersome. At times, she is having some difficulty sleeping at night, adding that she has always been a light sleeper and frequently awakened.  She does not use a fan at night and does not drink caffeine after noon.  [name] [name] denies any mass or nodule within her breast.  She reports a good appetite and denies weight loss.  She has had no cough, shortness of breath, or pain.  She has a follow up appointment with Dr. Lindi Adie in December 2016 and will see Dr. [name] in the spring.    REVIEW OF SYSTEMS:  General: Hot flashes as above, minimal per report. Ongoing fatigue, which is improving. Denies fever, chills, or night sweats. Cardiac: Denies palpitations, chest pain, and lower extremity edema.  Respiratory: Denies dyspnea on exertion.  GI: Denies abdominal pain, constipation, diarrhea, nausea, or vomiting.  GU: Denies dysuria, hematuria, vaginal bleeding, vaginal discharge, or vaginal dryness.  Musculoskeletal: Denies joint or bone pain.  Neuro: Denies headache or recent falls. Denies peripheral neuropathy. Skin: Denies rash, pruritis, or open wounds.  Breast: Denies any tenderness, nipple changes, or nipple discharge.  Psych: Denies depression, anxiety, or memory loss.   A 14-point review of systems was completed and  was negative, except as noted above.   ONCOLOGY TREATMENT TEAM:  1. Surgeon:  Dr. [name] at [facility]  2. Medical Oncologist: Dr. Lindi Adie 3. Radiation Oncologist: Dr. [name]    PAST MEDICAL/SURGICAL HISTORY:  Past Medical History  Diagnosis Date  . Diabetes mellitus  without complication   . Breast cancer   . Radiation 07/24/14-09/08/14    Right upper breast   Past Surgical History  Procedure Laterality Date  . Breast lumpectomy with needle localization Right 06/05/2014    Procedure: RIGHT BREAST LUMPECTOMY WITH NEEDLE LOCALIZATION;  Surgeon: Autumn Messing III, MD;  Location: [place];  Service: General;  Laterality: Right;  . Re-excision of breast lumpectomy Right 06/23/2014    Procedure: RE-EXCISION OF RIGHT BREAST INFERIOR MEDIAL MARGIN AND SENTINEL LYMPH NODE BIOPSY;  Surgeon: Autumn Messing III, MD;  Location: [place];  Service: General;  Laterality: Right;  . Axillary sentinel node biopsy Right 06/23/2014    Procedure: AXILLARY SENTINEL NODE BIOPSY;  Surgeon: Autumn Messing III, MD;  Location: [place];  Service: General;  Laterality: Right;     ALLERGIES:  Allergies  Allergen Reactions  . Food     shellfish  . Penicillins   . Tetracyclines & Related      CURRENT MEDICATIONS:  Current Outpatient Prescriptions on File Prior to Visit  Medication Sig Dispense Refill  . metFORMIN (GLUCOPHAGE) 500 MG tablet Take 500 mg by mouth 2 (two) times daily with a meal.    . tamoxifen (NOLVADEX) 20 MG tablet Take 1 tablet (20 mg total) by mouth daily. Start Aug 1st week 90 tablet 3  . oxyCODONE-acetaminophen (ROXICET) 5-325 MG per tablet Take 1-2 tablets by mouth every 4 (four) hours as needed. (Patient not taking: Reported on 11/03/2014) 50 tablet 0   No current facility-administered medications on file prior to visit.     ONCOLOGIC FAMILY HISTORY:  Family History  Problem Relation Age of Onset  . Colon cancer Mother     dx. late 18s  . Congestive Heart Failure Father     smoker  . Cancer Maternal Aunt     unspecified type; dx. 53s  . Heart attack Maternal Uncle   . Breast cancer Paternal Aunt     dx. 34s  . Heart attack Paternal Uncle   . Heart attack Paternal Grandmother   . Heart attack Paternal  Grandfather   . Congestive Heart Failure Paternal Aunt   . Cancer Cousin     unspecified type/unspecified type  . Breast cancer Cousin 13     GENETIC COUNSELING/TESTING: Performed 11/02/2014 - results pending at the time of this visit.  SOCIAL HISTORY:  [name] [name] is married and lives with her family in [place], [place].  She has 2 children. She denies any current or history of tobacco, alcohol, or illicit drug use.     PHYSICAL EXAMINATION:  Vital Signs:   Filed Vitals:   11/03/14 1128  BP: 124/67  Pulse: 81  Temp: 97.7 F (36.5 C)  Resp: 18   ECOG Performance status: 0 General: Well-nourished, well-appearing female in no acute distress.  She is unaccompanied in clinic today.   HEENT: Head is atraumatic and normocephalic.  Pupils equal and reactive to light and accomodation. Conjunctivae clear without exudate.  Sclerae anicteric. Oral mucosa is pink, moist, and intact without lesions.  Oropharynx is pink without lesions or erythema.  Lymph: No cervical, supraclavicular, infraclavicular, or axillary lymphadenopathy noted on palpation.  Cardiovascular: Regular  rate and rhythm without murmurs, rubs, or gallops. Respiratory: Clear to auscultation bilaterally. Chest expansion symmetric without accessory muscle use on inspiration or expiration.  GI: Abdomen soft and round. No tenderness to palpation. Bowel sounds normoactive in 4 quadrants.  Musculoskeletal: Muscle strength 5/5 in all extremities.   Neuro: No focal deficits. Steady gait.  Psych: Mood and affect normal and appropriate for situation.  Extremities: No edema, cyanosis, or clubbing.  Skin: Warm and dry. No open lesions noted.   LABORATORY DATA:  None for this visit.  DIAGNOSTIC IMAGING:  None for this visit.     ASSESSMENT AND PLAN:   1. History of breast cancer: Stage IA invasive lobular carcinoma, S/P lumpectomy and radiation therapy, on adjuvant endocrine therapy with tamoxifen (planned duration of  therapy 10 years), currently followed in a program of surveillance. [name] [name] is doing well without clinical symptoms worrisome for disease recurrence at this time.  She will follow-up with her medical oncologist,  Dr. Lindi Adie, in November 2016 with history and physical exam per surveillance protocol.  She will continue her anti-estrogen therapy with tamoxifen as prescribed by  Dr. Lindi Adie and was instructed to make Dr. Lindi Adie or myself aware if she begins to experience any worsening of her side effects.  I am happy to see her back in clinic to help manage those side effects, as needed. The hot flashes and insomnia are minimal at this time.  We discussed the use of a fan at night to see if this helps her sleep better.  Though the incidence is low, there is an associated risk of endometrial cancer with anti-estrogen therapies like Tamoxifen.  [name] [name] was encouraged to contact Dr. Lindi Adie or myself with any vaginal bleeding while taking Tamoxifen. Other side effects of Tamoxifen were again reviewed with her as well. A comprehensive survivorship care plan and treatment summary was reviewed with the patient today detailing her breast cancer diagnosis, treatment course, potential late/long-term effects of treatment, appropriate follow-up care with recommendations for the future, and patient education resources.  A copy of this summary, along with a letter will be sent to the patient's primary care provider via in basket message after today's visit.  [name] [name] is welcome to return to the Survivorship Clinic in the future, as needed; no follow-up will be scheduled at this time.    2. Cancer screening:  Due to [name] [name] history and her age, she should receive screening for skin cancers, colon cancer (beginning at age 50), and gynecologic cancers.  The information and recommendations are listed on the patient's comprehensive care plan/treatment summary and were reviewed in detail with the patient.   3. Health maintenance and  wellness promotion: [name] [name] was encouraged to consume 5-7 servings of fruits and vegetables per day. We reviewed the "Nutrition Rainbow" handout, as well as the handout about "Nutrition for Breast Cancer Survivors."  She was also encouraged to engage in moderate to vigorous exercise for 30 minutes per day most days of the week. We discussed the [employer] fitness program, which is designed for cancer survivors to help them become more physically fit after cancer treatments.  She was instructed to limit her alcohol consumption and continue to abstain from tobacco use.   4. Support services/counseling: It is not uncommon for this period of the patient's cancer care trajectory to be one of many emotions and stressors.  We discussed an opportunity for her to participate in the next session of [facility] ("Finding Your New Normal") support group  series designed for patients after they have completed treatment.   [name] [name] was encouraged to take advantage of our many other support services programs, support groups, and/or counseling in coping with her new life as a cancer survivor after completing anti-cancer treatment.  She was offered support today through active listening and expressive supportive counseling.  She was given information regarding our available services and encouraged to contact me with any questions or for help enrolling in any of our support group/programs.    A total of 50 minutes of face-to-face time was spent with this patient with greater than 50% of that time in counseling and care-coordination.   Sylvan Cheese, NP  Survivorship Program [facility] [phone]   Note: PRIMARY CARE PROVIDER Tawanna Solo, [place] [phone]

## 2014-11-14 ENCOUNTER — Encounter: Payer: Self-pay | Admitting: Adult Health

## 2014-11-14 NOTE — Progress Notes (Signed)
A birthday card was mailed to the patient today on behalf of the Survivorship Program at [facility].   Gretchen Dawson, NP Survivorship Program [facility] [phone]  

## 2014-11-22 ENCOUNTER — Ambulatory Visit: Payer: Self-pay | Admitting: Genetic Counselor

## 2014-11-22 ENCOUNTER — Telehealth: Payer: Self-pay | Admitting: Genetic Counselor

## 2014-11-22 DIAGNOSIS — Z1379 Encounter for other screening for genetic and chromosomal anomalies: Secondary | ICD-10-CM

## 2014-11-22 NOTE — Telephone Encounter (Signed)
Discussed with [name] [name] that her genetic test results were negative for pathogenic mutations within 20 genes that would cause her to be at an increased risk for breast, ovarian, or other related cancers.  Additionally no variants of uncertain significance (VUSs) were found.  While we still do not have an explanation for [name] [name]'s early-onset breast cancer, most cancers happen by chance.  Most of the other relatives affected with cancer in the family were diagnosed later in life.  Additionally, there are several relatives who have not had cancer and have lived to later ages.  Individuals in the family should still continue to follow the cancer screening guidelines of their primary and/or oncology providers.  Based on the family history of breast cancer, women in the family are still considered to be at a somewhat increased risk for breast cancer.  They should begin annual mammogram screening at the age of 71 or 23 years younger than the earliest diagnosis in the family.  [name] [name] should continue to follow screening guidelines as put forth by her primary doctor and oncologist, and she should begin colonoscopy screening at the age of 37, especially in consideration of her mother's history of colon cancer (dx. In her late 47s).  [name] [name] knows she is welcome to call me with any further questions.  She has requested a follow-up letter and copy of her test results to be mailed to her.  I am happy to oblige.

## 2014-11-27 DIAGNOSIS — Z1379 Encounter for other screening for genetic and chromosomal anomalies: Secondary | ICD-10-CM | POA: Insufficient documentation

## 2014-11-27 NOTE — Progress Notes (Signed)
GENETIC TEST RESULTS  HPI: [name] was previously seen in the [place] clinic due to a personal history of early-onset breast cancer, family history of breast cancer, and concerns regarding a hereditary predisposition to cancer. Please refer to our prior cancer genetics clinic note from November 02, 2014 for more information regarding [name], social and family histories, and our assessment and recommendations, at the time. [name] recent genetic test results were disclosed to her, as were recommendations warranted by these results. These results and recommendations are discussed in more detail below.  GENETIC TEST RESULTS: At the time of [name] visit on 11/02/14, we recommended she pursue genetic testing of the 20-gene Breast/Ovarian Cancer Panel through [facility] [name] Pigeon, MD).  The Breast/Ovarian Cancer Panel offered by [facility] includes sequencing and deletion/duplication analysis for the following 19 genes:  ATM, BARD1, BRCA1, BRCA2, BRIP1, CDH1, CHEK2, FANCC, MLH1, MSH2, MSH6, NBN, PALB2, PMS2, PTEN, RAD51C, RAD51D, TP53, and XRCC2.  This panel also includes deletion/duplication analysis (without sequencing) for one gene, EPCAM.  Those results are now back, the report date for which is November 20, 2014.  Genetic testing was normal, and did not reveal a deleterious mutation in these genes.  Additionally, no variants of uncertain significance (VUSs) were found.  The test report will be scanned into EPIC and will be located under the Results Review tab in the Pathology>Molecular Pathology section.   We discussed with [name] that since the current genetic testing is not perfect, it is possible there may be a gene mutation in one of these genes that current testing cannot detect, but that chance is small. We also discussed, that it is possible that another gene that has not yet been discovered, or that we have not yet tested, is responsible for the  cancer diagnoses in the family, and it is, therefore, important to remain in touch with cancer genetics in the future so that we can continue to offer [name] the most up to date genetic testing.    CANCER SCREENING RECOMMENDATIONS: While we still do not have an explanation for the personal and family history of cancer, this result is reassuring and indicates that [name] likely does not have an increased risk for a future cancer due to a mutation in one of these genes. This normal test also suggests that [name] cancer was most likely not due to an inherited predisposition associated with one of these genes.  Most cancers happen by chance and this negative test suggests that her cancer falls into this category.  Additionally, many individuals in the family have not had cancer and have lived to later ages in life.  For those family members who have had cancer, most of those diagnoses have occurred at later ages.  We, therefore, recommended she continue to follow the cancer management and screening guidelines provided by her oncology and primary healthcare providers.   RECOMMENDATIONS FOR FAMILY MEMBERS: Women in this family might be at some increased risk of developing cancer, over the general population risk, simply due to the family history of cancer. We recommended women in this family have a yearly mammogram beginning at age 73, or 36 years younger than the earliest onset of cancer, an an annual clinical breast exam, and perform monthly breast self-exams. Women in this family should also have a gynecological exam as recommended by their primary provider. All family members should have a colonoscopy by age 36.  Since [name] mother was diagnosed with  colon cancer in her late 29s, she should also begin colonoscopy at the age of 37.  Based on current [employer]  (NCCN) guidelines, she should have these every 5-10 years, or more often as indicated by positive findings.     FOLLOW-UP: Lastly, we discussed with [name] that cancer genetics is a rapidly advancing field and it is possible that new genetic tests will be appropriate for her and/or her family members in the future. We encouraged her to remain in contact with cancer genetics on an annual basis so we can update her personal and family histories and let her know of advances in cancer genetics that may benefit this family.   Our contact number was provided. [name] questions were answered to her satisfaction, and she knows she is welcome to call us at anytime with additional questions or concerns.  Additionally, [name] has requested a follow-up letter and a copy of her test results to be mailed to her.  We are happy to oblige.  Jeanine Luz, MS Genetic Counselor [email or web address] Phone: [phone]

## 2014-12-01 ENCOUNTER — Encounter: Payer: Self-pay | Admitting: Genetic Counselor

## 2015-01-16 ENCOUNTER — Encounter (HOSPITAL_COMMUNITY): Payer: Self-pay

## 2015-02-06 ENCOUNTER — Telehealth: Payer: Self-pay | Admitting: Hematology and Oncology

## 2015-02-06 NOTE — Telephone Encounter (Signed)
Patient called in to reschedule her appointment °

## 2015-02-09 ENCOUNTER — Ambulatory Visit: Payer: 59 | Admitting: Hematology and Oncology

## 2015-02-16 ENCOUNTER — Telehealth: Payer: Self-pay | Admitting: Hematology and Oncology

## 2015-02-16 ENCOUNTER — Ambulatory Visit (HOSPITAL_BASED_OUTPATIENT_CLINIC_OR_DEPARTMENT_OTHER): Payer: 59 | Admitting: Hematology and Oncology

## 2015-02-16 ENCOUNTER — Encounter: Payer: Self-pay | Admitting: Hematology and Oncology

## 2015-02-16 VITALS — BP 146/75 | HR 80 | Temp 97.9°F | Resp 19 | Ht 67.5 in | Wt 253.8 lb

## 2015-02-16 DIAGNOSIS — C50411 Malignant neoplasm of upper-outer quadrant of right female breast: Secondary | ICD-10-CM

## 2015-02-16 DIAGNOSIS — Z170 Estrogen receptor positive status [ER+]: Secondary | ICD-10-CM

## 2015-02-16 DIAGNOSIS — N951 Menopausal and female climacteric states: Secondary | ICD-10-CM

## 2015-02-16 DIAGNOSIS — Z79811 Long term (current) use of aromatase inhibitors: Secondary | ICD-10-CM

## 2015-02-16 NOTE — Telephone Encounter (Signed)
Appointments made and avs printed for patient °

## 2015-02-16 NOTE — Assessment & Plan Note (Signed)
Rt Lumpectomy 06/05/14: ILC 0.6 cm (rt Medial) 1.7 cm (Rt Lateral)Margins Neg, Grade 2 with LCIS, ER 100%, PR 99%, Ki 67 6%, Her 2 Neg, Oncotype DX recurrence score 8, risk of recurrence 6%, reexcision for margins benign, 0/3 lymph nodes negative T1b N0 M0 stage IA status post adjuvant radiation 07/24/2014 to 09/08/2014  Current treatment: Adjuvant antiestrogen therapy with tamoxifen 20 mg daily started 09/25/2014 5-10 years  Tamoxifen toxicities: 1. Hot flashes occasional sweats  Breast cancer surveillance: 1. Breast exam 02/16/2015 does not reveal any abnormalities 2. Annual mammograms  Genetic testing: Normal Return to clinic in 6 months for follow-up

## 2015-02-16 NOTE — Addendum Note (Signed)
Addended by: Prentiss Bells on: 02/16/2015 02:16 PM   Modules accepted: Medications

## 2015-02-16 NOTE — Progress Notes (Signed)
Patient Care Team: Kathyrn Lass, MD as PCP - General (Family Medicine) Autumn Messing III, MD as Consulting Physician (General Surgery) Nicholas Lose, MD as Consulting Physician (Hematology and Oncology) Gery Pray, MD as Consulting Physician (Radiation Oncology) Rockwell Germany, RN as Registered Nurse Mauro Kaufmann, RN as Registered Nurse Holley Bouche, NP as Nurse Practitioner (Nurse Practitioner) Sylvan Cheese, NP as Nurse Practitioner (Nurse Practitioner)  DIAGNOSIS: Breast cancer of upper-outer quadrant of right female breast [facility])   Staging form: Breast, AJCC 7th Edition     Clinical stage from 04/26/2014: Stage 0 (Tis (DCIS), N0, M0) - Unsigned       Staging comments: Staged at breast conference 3.16.16    SUMMARY OF ONCOLOGIC HISTORY:   Breast cancer of upper-outer quadrant of right female breast ([place])   04/07/2014 Mammogram Right breast: calcifications    04/14/2014 Initial Biopsy Right breast needle biopsy: Mammary carcinoma in situ with calcifications at 9 to 10:00 position ER+ (95%), PR+ (66%), DCIS plus LCIS, heterogeneous somewhat E Cadherin positive some negative   04/26/2014 Breast MRI Biopsy-proven DCIS is identified in the outer quadrant; ~ 3 cm AP diameter linear clumped NME extends from the medial biopsy cavity posteriorly in the central breast which is suspicious for disease extension   04/26/2014 Clinical Stage Stage 0: Tis Nx   06/05/2014 Definitive Surgery Right lumpectomy [name]): ILC, grade 2/3, 0.6 cm, LCIS invasive ca focally present at inferior margin (rt Medial); ILC, grade 2-3, 1.7 cm with neg margins with LCIS (Rt Lateral) ER 100%, PR 99%, Ki 67 6%, HER2/neu neg (ratio 1.14)   06/05/2014 Oncotype testing Score: 8 (ROR 6%)   06/05/2014 Pathologic Stage Stage IA: mpT1c pNx   06/23/2014 Surgery Right breast excision: Benign, 0/3 lymph nodes negative   07/24/2014 - 09/08/2014 Radiation Therapy Adjuvant RT completed [name]): Right breast 45 Gy over 25  fractions.  Right breast boost 16 Gy over 8 fractions.  Total dose: 60 Gy.     09/25/2014 -  Anti-estrogen oral therapy Tamoxifen 20 mg daily (Ciel Chervenak).  Planned duration of therapy 5-10 years.   11/02/2014 Procedure Breast/Ovarian panel revealed no clinically significant variant at ATM, BARD1, BRCA1, BRCA2, BRIP1, CDH1, CHEK2, FANCC, MLH1, MSH2, MSH6, NBN, PALB2, PMS2, PTEN, RAD51C, RAD51D, TP53, and XRCC2   11/03/2014 Survivorship Survivorship visit completed and copy of survivorship care plan provided to patient.    CHIEF COMPLIANT: follow-up on tamoxifen  INTERVAL HISTORY: Antonina Deziel is a 50 year old with above-mentioned history of right breast cancer currently on adjuvant therapy with tamoxifen. She has hot flashes and night sweats. These are not too severe and she is able to manage her life fairly well. She has not been exercising at doing any physical activity. She denies any lumps or nodules in the breasts. She is slightly concerned about the discoloration from radiation therapy.  REVIEW OF SYSTEMS:   Constitutional: Denies fevers, chills or abnormal weight loss Eyes: Denies blurriness of vision Ears, nose, mouth, throat, and face: Denies mucositis or sore throat Respiratory: Denies cough, dyspnea or wheezes Cardiovascular: Denies palpitation, chest discomfort Gastrointestinal:  Denies nausea, heartburn or change in bowel habits Skin: Denies abnormal skin rashes Lymphatics: Denies new lymphadenopathy or easy bruising Neurological:Denies numbness, tingling or new weaknesses Behavioral/Psych: Mood is stable, no new changes  Extremities: No lower extremity edema Breast: discoloration from radiation treatment All other systems were reviewed with the patient and are negative.  I have reviewed the past medical history, past surgical history, social history and family  history with the patient and they are unchanged from previous note.  ALLERGIES:  is allergic to food; penicillins; and  tetracyclines & related.  MEDICATIONS:  Current Outpatient Prescriptions  Medication Sig Dispense Refill  . metFORMIN (GLUCOPHAGE) 500 MG tablet Take 500 mg by mouth 2 (two) times daily with a meal.    . oxyCODONE-acetaminophen (ROXICET) 5-325 MG per tablet Take 1-2 tablets by mouth every 4 (four) hours as needed. (Patient not taking: Reported on 11/03/2014) 50 tablet 0  . tamoxifen (NOLVADEX) 20 MG tablet Take 1 tablet (20 mg total) by mouth daily. Start Aug 1st week 90 tablet 3   No current facility-administered medications for this visit.    PHYSICAL EXAMINATION: ECOG PERFORMANCE STATUS: 1 - Symptomatic but completely ambulatory  Filed Vitals:   02/16/15 1139  BP: 146/75  Pulse: 80  Temp: 97.9 F (36.6 C)  Resp: 19   Filed Weights   02/16/15 1139  Weight: 253 lb 12.8 oz (115.123 kg)    GENERAL:alert, no distress and comfortable SKIN: skin color, texture, turgor are normal, no rashes or significant lesions EYES: normal, Conjunctiva are pink and non-injected, sclera clear OROPHARYNX:no exudate, no erythema and lips, buccal mucosa, and tongue normal  NECK: supple, thyroid normal size, non-tender, without nodularity LYMPH:  no palpable lymphadenopathy in the cervical, axillary or inguinal LUNGS: clear to auscultation and percussion with normal breathing effort HEART: regular rate & rhythm and no murmurs and no lower extremity edema ABDOMEN:abdomen soft, non-tender and normal bowel sounds MUSCULOSKELETAL:no cyanosis of digits and no clubbing  NEURO: alert & oriented x 3 with fluent speech, no focal motor/sensory deficits EXTREMITIES: No lower extremity edema   LABORATORY DATA:  I have reviewed the data as listed   Chemistry      Component Value Date/Time   NA 140 06/02/2014 1530   NA 144 04/26/2014 1224   K 3.9 06/02/2014 1530   K 4.2 04/26/2014 1224   CL 100 06/02/2014 1530   CO2 30 06/02/2014 1530   CO2 27 04/26/2014 1224   BUN 11 06/02/2014 1530   BUN 14.7  04/26/2014 1224   CREATININE 0.88 06/02/2014 1530   CREATININE 1.0 04/26/2014 1224      Component Value Date/Time   CALCIUM 9.0 06/02/2014 1530   CALCIUM 9.5 04/26/2014 1224   ALKPHOS 71 04/26/2014 1224   AST 12 04/26/2014 1224   ALT 16 04/26/2014 1224   BILITOT 0.28 04/26/2014 1224       Lab Results  Component Value Date   WBC 6.3 04/26/2014   HGB 11.5* 06/05/2014   HCT 36.9 04/26/2014   MCV 84.4 04/26/2014   PLT 336 04/26/2014   NEUTROABS 3.3 04/26/2014     ASSESSMENT & PLAN:  Breast cancer of upper-outer quadrant of right female breast Rt Lumpectomy 06/05/14: ILC 0.6 cm (rt Medial) 1.7 cm (Rt Lateral)Margins Neg, Grade 2 with LCIS, ER 100%, PR 99%, Ki 67 6%, Her 2 Neg, Oncotype DX recurrence score 8, risk of recurrence 6%, reexcision for margins benign, 0/3 lymph nodes negative T1b N0 M0 stage IA status post adjuvant radiation 07/24/2014 to 09/08/2014  Current treatment: Adjuvant antiestrogen therapy with tamoxifen 20 mg daily started 09/25/2014 5-10 years  Tamoxifen toxicities: 1. Hot flashes occasional sweats  Breast cancer surveillance: 1. Breast exam 02/16/2015 does not reveal any abnormalities 2. Annual mammograms  Genetic testing: Normal Return to clinic in 6 months for follow-up and breast exams   No orders of the defined types were placed in this encounter.  The patient has a good understanding of the overall plan. she agrees with it. she will call with any problems that may develop before the next visit here.   Rulon Eisenmenger, MD 02/16/2015

## 2015-04-13 ENCOUNTER — Ambulatory Visit
Admission: RE | Admit: 2015-04-13 | Discharge: 2015-04-13 | Disposition: A | Discharge status: Home / Self Care | Payer: 59 | Source: Ambulatory Visit | Attending: Hematology and Oncology | Admitting: Hematology and Oncology

## 2015-04-13 DIAGNOSIS — C50411 Malignant neoplasm of upper-outer quadrant of right female breast: Secondary | ICD-10-CM

## 2015-08-17 ENCOUNTER — Telehealth: Payer: Self-pay | Admitting: Hematology and Oncology

## 2015-08-17 ENCOUNTER — Encounter: Payer: Self-pay | Admitting: Hematology and Oncology

## 2015-08-17 ENCOUNTER — Ambulatory Visit (HOSPITAL_BASED_OUTPATIENT_CLINIC_OR_DEPARTMENT_OTHER): Payer: 59 | Admitting: Hematology and Oncology

## 2015-08-17 VITALS — BP 146/89 | HR 79 | Temp 98.1°F | Resp 18 | Ht 67.5 in | Wt 254.0 lb

## 2015-08-17 DIAGNOSIS — C50411 Malignant neoplasm of upper-outer quadrant of right female breast: Secondary | ICD-10-CM | POA: Diagnosis not present

## 2015-08-17 DIAGNOSIS — Z79811 Long term (current) use of aromatase inhibitors: Secondary | ICD-10-CM | POA: Diagnosis not present

## 2015-08-17 DIAGNOSIS — N951 Menopausal and female climacteric states: Secondary | ICD-10-CM | POA: Diagnosis not present

## 2015-08-17 MED ORDER — METFORMIN HCL 500 MG PO TABS: 500.0000 mg | ORAL_TABLET | Freq: Four times a day (QID) | ORAL | Status: DC

## 2015-08-17 NOTE — Assessment & Plan Note (Signed)
Rt Lumpectomy 06/05/14: ILC 0.6 cm (rt Medial) 1.7 cm (Rt Lateral)Margins Neg, Grade 2 with LCIS, ER 100%, PR 99%, Ki 67 6%, Her 2 Neg, Oncotype DX recurrence score 8, risk of recurrence 6%, reexcision for margins benign, 0/3 lymph nodes negative T1b N0 M0 stage IA status post adjuvant radiation 07/24/2014 to 09/08/2014  Current treatment: Adjuvant antiestrogen therapy with tamoxifen 20 mg daily started 09/25/2014 5-10 years  Tamoxifen toxicities: 1. Hot flashes  2. Occasional sweats  Breast cancer surveillance: 1. Breast exam 08/17/2015 does not reveal any abnormalities 2. Annual mammograms  Genetic testing: Normal Return to clinic in 6 months for follow-up and breast exams

## 2015-08-17 NOTE — Progress Notes (Signed)
Patient Care Team: Kathyrn Lass, MD as PCP - General (Family Medicine) Autumn Messing III, MD as Consulting Physician (General Surgery) Nicholas Lose, MD as Consulting Physician (Hematology and Oncology) Gery Pray, MD as Consulting Physician (Radiation Oncology) Rockwell Germany, RN as Registered Nurse Mauro Kaufmann, RN as Registered Nurse Holley Bouche, NP as Nurse Practitioner (Nurse Practitioner) Sylvan Cheese, NP as Nurse Practitioner (Nurse Practitioner)  DIAGNOSIS: Breast cancer of upper-outer quadrant of right female breast [facility])   Staging form: Breast, AJCC 7th Edition     Clinical stage from 04/26/2014: Stage 0 (Tis (DCIS), N0, M0) - Unsigned       Staging comments: Staged at breast conference 3.16.16    SUMMARY OF ONCOLOGIC HISTORY:   Breast cancer of upper-outer quadrant of right female breast ([place])   04/07/2014 Mammogram Right breast: calcifications    04/14/2014 Initial Biopsy Right breast needle biopsy: Mammary carcinoma in situ with calcifications at 9 to 10:00 position ER+ (95%), PR+ (66%), DCIS plus LCIS, heterogeneous somewhat E Cadherin positive some negative   04/26/2014 Breast MRI Biopsy-proven DCIS is identified in the outer quadrant; ~ 3 cm AP diameter linear clumped NME extends from the medial biopsy cavity posteriorly in the central breast which is suspicious for disease extension   04/26/2014 Clinical Stage Stage 0: Tis Nx   06/05/2014 Definitive Surgery Right lumpectomy [name]): ILC, grade 2/3, 0.6 cm, LCIS invasive ca focally present at inferior margin (rt Medial); ILC, grade 2-3, 1.7 cm with neg margins with LCIS (Rt Lateral) ER 100%, PR 99%, Ki 67 6%, HER2/neu neg (ratio 1.14)   06/05/2014 Oncotype testing Score: 8 (ROR 6%)   06/05/2014 Pathologic Stage Stage IA: mpT1c pNx   06/23/2014 Surgery Right breast excision: Benign, 0/3 lymph nodes negative   07/24/2014 - 09/08/2014 Radiation Therapy Adjuvant RT completed [name]): Right breast 45 Gy over 25  fractions.  Right breast boost 16 Gy over 8 fractions.  Total dose: 60 Gy.     09/25/2014 -  Anti-estrogen oral therapy Tamoxifen 20 mg daily (Jachelle [name]).  Planned duration of therapy 5-10 years.   11/02/2014 Procedure Breast/Ovarian panel revealed no clinically significant variant at ATM, BARD1, BRCA1, BRCA2, BRIP1, CDH1, CHEK2, FANCC, MLH1, MSH2, MSH6, NBN, PALB2, PMS2, PTEN, RAD51C, RAD51D, TP53, and XRCC2   11/03/2014 Survivorship Survivorship visit completed and copy of survivorship care plan provided to patient.    CHIEF COMPLIANT: Follow-up on tamoxifen therapy  INTERVAL HISTORY: [name] [name] is a 50 year old with above-mentioned history of right breast cancer underwent lumpectomy followed by adjuvant radiation and is currently on tamoxifen. She appears to be tolerating tamoxifen fairly well. She continues to have hot flashes. She had a mammogram in March which was normal. She is very concerned about her weight issues and the fact that she is now requiring metformin 4 times a day.  REVIEW OF SYSTEMS:   Constitutional: Denies fevers, chills or abnormal weight loss Eyes: Denies blurriness of vision Ears, nose, mouth, throat, and face: Denies mucositis or sore throat Respiratory: Denies cough, dyspnea or wheezes Cardiovascular: Denies palpitation, chest discomfort Gastrointestinal:  Denies nausea, heartburn or change in bowel habits Skin: Denies abnormal skin rashes Lymphatics: Denies new lymphadenopathy or easy bruising Neurological:Denies numbness, tingling or new weaknesses Behavioral/Psych: Mood is stable, no new changes  Extremities: No lower extremity edema Breast:  denies any pain or lumps or nodules in either breasts All other systems were reviewed with the patient and are negative.  I have reviewed the past medical history, past  surgical history, social history and family history with the patient and they are unchanged from previous note.  ALLERGIES:  is allergic to food; penicillins;  and tetracyclines & related.  MEDICATIONS:  Current Outpatient Prescriptions  Medication Sig Dispense Refill  . metFORMIN (GLUCOPHAGE) 500 MG tablet Take 500 mg by mouth 2 (two) times daily with a meal.    . oxyCODONE-acetaminophen (ROXICET) 5-325 MG per tablet Take 1-2 tablets by mouth every 4 (four) hours as needed. 50 tablet 0  . tamoxifen (NOLVADEX) 20 MG tablet Take 1 tablet (20 mg total) by mouth daily. Start Aug 1st week 90 tablet 3   No current facility-administered medications for this visit.    PHYSICAL EXAMINATION: ECOG PERFORMANCE STATUS: 1 - Symptomatic but completely ambulatory  Filed Vitals:   08/17/15 1222  BP: 146/89  Pulse: 79  Temp: 98.1 F (36.7 C)  Resp: 18   Filed Weights   08/17/15 1222  Weight: 254 lb (115.214 kg)    GENERAL:alert, no distress and comfortable SKIN: skin color, texture, turgor are normal, no rashes or significant lesions EYES: normal, Conjunctiva are pink and non-injected, sclera clear OROPHARYNX:no exudate, no erythema and lips, buccal mucosa, and tongue normal  NECK: supple, thyroid normal size, non-tender, without nodularity LYMPH:  no palpable lymphadenopathy in the cervical, axillary or inguinal LUNGS: clear to auscultation and percussion with normal breathing effort HEART: regular rate & rhythm and no murmurs and no lower extremity edema ABDOMEN:abdomen soft, non-tender and normal bowel sounds MUSCULOSKELETAL:no cyanosis of digits and no clubbing  NEURO: alert & oriented x 3 with fluent speech, no focal motor/sensory deficits EXTREMITIES: No lower extremity edema BREAST: No palpable masses or nodules in either right or left breasts. Skin discoloration of the right breast. No palpable axillary supraclavicular or infraclavicular adenopathy no breast tenderness or nipple discharge. (exam performed in the presence of a chaperone)  LABORATORY DATA:  I have reviewed the data as listed   Chemistry      Component Value Date/Time    NA 140 06/02/2014 1530   NA 144 04/26/2014 1224   K 3.9 06/02/2014 1530   K 4.2 04/26/2014 1224   CL 100 06/02/2014 1530   CO2 30 06/02/2014 1530   CO2 27 04/26/2014 1224   BUN 11 06/02/2014 1530   BUN 14.7 04/26/2014 1224   CREATININE 0.88 06/02/2014 1530   CREATININE 1.0 04/26/2014 1224      Component Value Date/Time   CALCIUM 9.0 06/02/2014 1530   CALCIUM 9.5 04/26/2014 1224   ALKPHOS 71 04/26/2014 1224   AST 12 04/26/2014 1224   ALT 16 04/26/2014 1224   BILITOT 0.28 04/26/2014 1224       Lab Results  Component Value Date   WBC 6.3 04/26/2014   HGB 11.5* 06/05/2014   HCT 36.9 04/26/2014   MCV 84.4 04/26/2014   PLT 336 04/26/2014   NEUTROABS 3.3 04/26/2014     ASSESSMENT & PLAN:  Breast cancer of upper-outer quadrant of right female breast Rt Lumpectomy 06/05/14: ILC 0.6 cm (rt Medial) 1.7 cm (Rt Lateral)Margins Neg, Grade 2 with LCIS, ER 100%, PR 99%, Ki 67 6%, Her 2 Neg, Oncotype DX recurrence score 8, risk of recurrence 6%, reexcision for margins benign, 0/3 lymph nodes negative T1b N0 M0 stage IA status post adjuvant radiation 07/24/2014 to 09/08/2014  Current treatment: Adjuvant antiestrogen therapy with tamoxifen 20 mg daily started 09/25/2014 5-10 years  Tamoxifen toxicities: 1. Hot flashes  2. Occasional sweats  Breast cancer surveillance: 1. Breast  exam 08/17/2015 does not reveal any abnormalities 2. Annual mammograms March 2017 benign, breast density category B  Obesity: I discussed with the patient about losing weight and I encouraged her to participate in the weight loss program as well as also to follow intermittent fasting and I provided her with information regarding how to lose weight.  Genetic testing: Normal Return to clinic in 6 months for follow-up and breast exams   No orders of the defined types were placed in this encounter.   The patient has a good understanding of the overall plan. she agrees with it. she will call with any problems  that may develop before the next visit here.   Rulon Eisenmenger, MD 08/17/2015

## 2015-08-17 NOTE — Telephone Encounter (Signed)
appt made and avs printed °

## 2015-12-20 ENCOUNTER — Other Ambulatory Visit: Payer: Self-pay | Admitting: Hematology and Oncology

## 2015-12-20 DIAGNOSIS — C50411 Malignant neoplasm of upper-outer quadrant of right female breast: Secondary | ICD-10-CM

## 2016-02-15 ENCOUNTER — Encounter: Payer: Self-pay | Admitting: Hematology and Oncology

## 2016-02-15 ENCOUNTER — Ambulatory Visit (HOSPITAL_BASED_OUTPATIENT_CLINIC_OR_DEPARTMENT_OTHER): Payer: 59 | Admitting: Hematology and Oncology

## 2016-02-15 DIAGNOSIS — C50411 Malignant neoplasm of upper-outer quadrant of right female breast: Secondary | ICD-10-CM

## 2016-02-15 DIAGNOSIS — Z170 Estrogen receptor positive status [ER+]: Secondary | ICD-10-CM | POA: Diagnosis not present

## 2016-02-15 NOTE — Progress Notes (Signed)
Patient Care Team: Kathyrn Lass, MD as PCP - General (Family Medicine) Autumn Messing III, MD as Consulting Physician (General Surgery) Nicholas Lose, MD as Consulting Physician (Hematology and Oncology) Gery Pray, MD as Consulting Physician (Radiation Oncology) Rockwell Germany, RN as Registered Nurse Mauro Kaufmann, RN as Registered Nurse Holley Bouche, NP as Nurse Practitioner (Nurse Practitioner) Sylvan Cheese, NP as Nurse Practitioner (Nurse Practitioner)  DIAGNOSIS:  Encounter Diagnosis  Name Primary?  . Malignant neoplasm of upper-outer quadrant of right breast in female, estrogen receptor positive ([employer])     SUMMARY OF ONCOLOGIC HISTORY:   Breast cancer of upper-outer quadrant of right female breast ([place])   04/07/2014 Mammogram    Right breast: calcifications       04/14/2014 Initial Biopsy    Right breast needle biopsy: Mammary carcinoma in situ with calcifications at 9 to 10:00 position ER+ (95%), PR+ (66%), DCIS plus LCIS, heterogeneous somewhat E Cadherin positive some negative      04/26/2014 Breast MRI    Biopsy-proven DCIS is identified in the outer quadrant; ~ 3 cm AP diameter linear clumped NME extends from the medial biopsy cavity posteriorly in the central breast which is suspicious for disease extension      04/26/2014 Clinical Stage    Stage 0: Tis Nx      06/05/2014 Definitive Surgery    Right lumpectomy [name]): ILC, grade 2/3, 0.6 cm, LCIS invasive ca focally present at inferior margin (rt Medial); ILC, grade 2-3, 1.7 cm with neg margins with LCIS (Rt Lateral) ER 100%, PR 99%, Ki 67 6%, HER2/neu neg (ratio 1.14)      06/05/2014 Oncotype testing    Score: 8 (ROR 6%)      06/05/2014 Pathologic Stage    Stage IA: mpT1c pNx      06/23/2014 Surgery    Right breast excision: Benign, 0/3 lymph nodes negative      07/24/2014 - 09/08/2014 Radiation Therapy    Adjuvant RT completed [name]): Right breast 45 Gy over 25 fractions.  Right breast boost  16 Gy over 8 fractions.  Total dose: 60 Gy.        09/25/2014 -  Anti-estrogen oral therapy    Tamoxifen 20 mg daily (Rosalie Buenaventura).  Planned duration of therapy 5-10 years.      11/02/2014 Procedure    Breast/Ovarian panel revealed no clinically significant variant at ATM, BARD1, BRCA1, BRCA2, BRIP1, CDH1, CHEK2, FANCC, MLH1, MSH2, MSH6, NBN, PALB2, PMS2, PTEN, RAD51C, RAD51D, TP53, and XRCC2      11/03/2014 Survivorship    Survivorship visit completed and copy of survivorship care plan provided to patient.       CHIEF COMPLIANT: Follow-up on tamoxifen therapy  INTERVAL HISTORY: [name] [name] is a 51 year old with above-mentioned history of right breast cancer treated with lumpectomy and adjuvant radiation. She is currently on tamoxifen. She is tolerating it extremely well. She has occasional hot flashes. She denies any lumps or nodules in the breast. She has been unable to lose weight. She reports mild joint aches and pains.  REVIEW OF SYSTEMS:   Constitutional: Denies fevers, chills or abnormal weight loss Eyes: Denies blurriness of vision Ears, nose, mouth, throat, and face: Denies mucositis or sore throat Respiratory: Denies cough, dyspnea or wheezes Cardiovascular: Denies palpitation, chest discomfort Gastrointestinal:  Denies nausea, heartburn or change in bowel habits Skin: Denies abnormal skin rashes Lymphatics: Denies new lymphadenopathy or easy bruising Neurological:Denies numbness, tingling or new weaknesses Behavioral/Psych: Mood is stable, no new changes  Extremities: No lower extremity edema Breast:  denies any pain or lumps or nodules in either breasts All other systems were reviewed with the patient and are negative.  I have reviewed the past medical history, past surgical history, social history and family history with the patient and they are unchanged from previous note.  ALLERGIES:  is allergic to food; penicillins; and tetracyclines & related.  MEDICATIONS:  Current  Outpatient Prescriptions  Medication Sig Dispense Refill  . metFORMIN (GLUCOPHAGE) 500 MG tablet Take 1 tablet (500 mg total) by mouth 4 (four) times daily.    . tamoxifen (NOLVADEX) 20 MG tablet TAKE 1 TABLET DAILY (START AUGUST 1ST WEEK) 90 tablet 3   No current facility-administered medications for this visit.     PHYSICAL EXAMINATION: ECOG PERFORMANCE STATUS: 1 - Symptomatic but completely ambulatory  Vitals:   02/15/16 1224  BP: (!) 164/94  Pulse: 98  Resp: 18  Temp: 98 F (36.7 C)   Filed Weights   02/15/16 1224  Weight: 251 lb 6.4 oz (114 kg)    GENERAL:alert, no distress and comfortable SKIN: skin color, texture, turgor are normal, no rashes or significant lesions EYES: normal, Conjunctiva are pink and non-injected, sclera clear OROPHARYNX:no exudate, no erythema and lips, buccal mucosa, and tongue normal  NECK: supple, thyroid normal size, non-tender, without nodularity LYMPH:  no palpable lymphadenopathy in the cervical, axillary or inguinal LUNGS: clear to auscultation and percussion with normal breathing effort HEART: regular rate & rhythm and no murmurs and no lower extremity edema ABDOMEN:abdomen soft, non-tender and normal bowel sounds MUSCULOSKELETAL:no cyanosis of digits and no clubbing  NEURO: alert & oriented x 3 with fluent speech, no focal motor/sensory deficits EXTREMITIES: No lower extremity edema  LABORATORY DATA:  I have reviewed the data as listed   Chemistry      Component Value Date/Time   NA 140 06/02/2014 1530   NA 144 04/26/2014 1224   K 3.9 06/02/2014 1530   K 4.2 04/26/2014 1224   CL 100 06/02/2014 1530   CO2 30 06/02/2014 1530   CO2 27 04/26/2014 1224   BUN 11 06/02/2014 1530   BUN 14.7 04/26/2014 1224   CREATININE 0.88 06/02/2014 1530   CREATININE 1.0 04/26/2014 1224      Component Value Date/Time   CALCIUM 9.0 06/02/2014 1530   CALCIUM 9.5 04/26/2014 1224   ALKPHOS 71 04/26/2014 1224   AST 12 04/26/2014 1224   ALT 16  04/26/2014 1224   BILITOT 0.28 04/26/2014 1224       Lab Results  Component Value Date   WBC 6.3 04/26/2014   HGB 11.5 (L) 06/05/2014   HCT 36.9 04/26/2014   MCV 84.4 04/26/2014   PLT 336 04/26/2014   NEUTROABS 3.3 04/26/2014    ASSESSMENT & PLAN:  Breast cancer of upper-outer quadrant of right female breast Rt Lumpectomy 06/05/14: ILC 0.6 cm (rt Medial) 1.7 cm (Rt Lateral)Margins Neg, Grade 2 with LCIS, ER 100%, PR 99%, Ki 67 6%, Her 2 Neg, Oncotype DX recurrence score 8, risk of recurrence 6%, reexcision for margins benign, 0/3 lymph nodes negative T1b N0 M0 stage IA status post adjuvant radiation 07/24/2014 to 09/08/2014 Genetic testing: Normal  Current treatment: Adjuvant antiestrogen therapy with tamoxifen 20 mg daily started 09/25/2014 5-10 years  Tamoxifen toxicities: 1. Hot flashes : Continuing to wax and vane 2. Occasional sweats  Breast cancer surveillance: 1. Breast exam on 02/15/16 does not reveal any abnormalities 2. Annual mammograms March 2017 benign, breast density category B  Obesity: I provided patient information on living well program to help lose weight  Patient is moving to [facility] as part of her promotion for her job. She works for [employer]. She is very excited about this. I encouraged her to find a local oncologist to make it easier her. She plans to come to [facility] for her follow-up visits annually. However if she finds a Museum/gallery conservator. Happy to transfer her care over.  Return to clinic in 1 year for follow-up and breast exams  The patient has a good understanding of the overall plan. she agrees with it. she will call with any problems that may develop before the next visit here.   Rulon Eisenmenger, MD 02/15/16

## 2016-02-15 NOTE — Assessment & Plan Note (Signed)
Rt Lumpectomy 06/05/14: ILC 0.6 cm (rt Medial) 1.7 cm (Rt Lateral)Margins Neg, Grade 2 with LCIS, ER 100%, PR 99%, Ki 67 6%, Her 2 Neg, Oncotype DX recurrence score 8, risk of recurrence 6%, reexcision for margins benign, 0/3 lymph nodes negative T1b N0 M0 stage IA status post adjuvant radiation 07/24/2014 to 09/08/2014  Current treatment: Adjuvant antiestrogen therapy with tamoxifen 20 mg daily started 09/25/2014 5-10 years  Tamoxifen toxicities: 1. Hot flashes  2. Occasional sweats  Breast cancer surveillance: 1. Breast exam on February 2018 does not reveal any abnormalities 2. Annual mammograms March 2017 benign, breast density category B  Obesity: I discussed with the patient about losing weight and I encouraged her to participate in the weight loss program as well as also to follow intermittent fasting and I provided her with information regarding how to lose weight.  Genetic testing: Normal Return to clinic in 1 year for follow-up and breast exams

## 2016-03-07 ENCOUNTER — Other Ambulatory Visit: Payer: Self-pay | Admitting: Hematology and Oncology

## 2016-03-07 DIAGNOSIS — Z853 Personal history of malignant neoplasm of breast: Secondary | ICD-10-CM

## 2016-03-11 DIAGNOSIS — E119 Type 2 diabetes mellitus without complications: Secondary | ICD-10-CM | POA: Diagnosis not present

## 2016-03-27 ENCOUNTER — Encounter (HOSPITAL_COMMUNITY): Payer: Self-pay

## 2016-04-10 DIAGNOSIS — J189 Pneumonia, unspecified organism: Secondary | ICD-10-CM

## 2016-04-10 HISTORY — DX: Pneumonia, unspecified organism: J18.9

## 2016-04-14 ENCOUNTER — Ambulatory Visit
Admission: RE | Admit: 2016-04-14 | Discharge: 2016-04-14 | Disposition: A | Discharge status: Home / Self Care | Payer: 59 | Source: Ambulatory Visit | Attending: Hematology and Oncology | Admitting: Hematology and Oncology

## 2016-04-14 DIAGNOSIS — R922 Inconclusive mammogram: Secondary | ICD-10-CM | POA: Diagnosis not present

## 2016-04-14 DIAGNOSIS — Z853 Personal history of malignant neoplasm of breast: Secondary | ICD-10-CM

## 2016-04-17 ENCOUNTER — Ambulatory Visit (HOSPITAL_BASED_OUTPATIENT_CLINIC_OR_DEPARTMENT_OTHER): Payer: 59 | Admitting: Hematology and Oncology

## 2016-04-17 DIAGNOSIS — Z170 Estrogen receptor positive status [ER+]: Secondary | ICD-10-CM

## 2016-04-17 DIAGNOSIS — N951 Menopausal and female climacteric states: Secondary | ICD-10-CM | POA: Diagnosis not present

## 2016-04-17 DIAGNOSIS — C50411 Malignant neoplasm of upper-outer quadrant of right female breast: Secondary | ICD-10-CM

## 2016-04-17 DIAGNOSIS — Z79811 Long term (current) use of aromatase inhibitors: Secondary | ICD-10-CM

## 2016-04-17 NOTE — Progress Notes (Signed)
Patient Care Team: Kathyrn Lass, MD as PCP - General (Family Medicine) Autumn Messing III, MD as Consulting Physician (General Surgery) Nicholas Lose, MD as Consulting Physician (Hematology and Oncology) Gery Pray, MD as Consulting Physician (Radiation Oncology) Rockwell Germany, RN as Registered Nurse Mauro Kaufmann, RN as Registered Nurse Holley Bouche, NP as Nurse Practitioner (Nurse Practitioner) Sylvan Cheese, NP as Nurse Practitioner (Nurse Practitioner)  DIAGNOSIS:  Encounter Diagnosis  Name Primary?  . Malignant neoplasm of upper-outer quadrant of right breast in female, estrogen receptor positive ([place])     SUMMARY OF ONCOLOGIC HISTORY:   Breast cancer of upper-outer quadrant of right female breast ([place])   04/07/2014 Mammogram    Right breast: calcifications       04/14/2014 Initial Biopsy    Right breast needle biopsy: Mammary carcinoma in situ with calcifications at 9 to 10:00 position ER+ (95%), PR+ (66%), DCIS plus LCIS, heterogeneous somewhat E Cadherin positive some negative      04/26/2014 Breast MRI    Biopsy-proven DCIS is identified in the outer quadrant; ~ 3 cm AP diameter linear clumped NME extends from the medial biopsy cavity posteriorly in the central breast which is suspicious for disease extension      04/26/2014 Clinical Stage    Stage 0: Tis Nx      06/05/2014 Definitive Surgery    Right lumpectomy [name]): ILC, grade 2/3, 0.6 cm, LCIS invasive ca focally present at inferior margin (rt Medial); ILC, grade 2-3, 1.7 cm with neg margins with LCIS (Rt Lateral) ER 100%, PR 99%, Ki 67 6%, HER2/neu neg (ratio 1.14)      06/05/2014 Oncotype testing    Score: 8 (ROR 6%)      06/05/2014 Pathologic Stage    Stage IA: mpT1c pNx      06/23/2014 Surgery    Right breast excision: Benign, 0/3 lymph nodes negative      07/24/2014 - 09/08/2014 Radiation Therapy    Adjuvant RT completed [name]): Right breast 45 Gy over 25 fractions.  Right breast boost  16 Gy over 8 fractions.  Total dose: 60 Gy.        09/25/2014 -  Anti-estrogen oral therapy    Tamoxifen 20 mg daily (Shah Insley).  Planned duration of therapy 5-10 years.      11/02/2014 Procedure    Breast/Ovarian panel revealed no clinically significant variant at ATM, BARD1, BRCA1, BRCA2, BRIP1, CDH1, CHEK2, FANCC, MLH1, MSH2, MSH6, NBN, PALB2, PMS2, PTEN, RAD51C, RAD51D, TP53, and XRCC2      11/03/2014 Survivorship    Survivorship visit completed and copy of survivorship care plan provided to patient.       CHIEF COMPLIANT: Follow-up after recent mammogram  INTERVAL HISTORY: [name] [name] is a 51 year old with above-mentioned history of right breast cancer treated with lumpectomy and radiation is currently on hormonal therapy with tamoxifen. She is tolerating tamoxifen fairly well. Recently she had a mammogram and is here today to discuss the results with me.  REVIEW OF SYSTEMS:   Constitutional: Denies fevers, chills or abnormal weight loss Eyes: Denies blurriness of vision Ears, nose, mouth, throat, and face: Denies mucositis or sore throat Respiratory: Denies cough, dyspnea or wheezes Cardiovascular: Denies palpitation, chest discomfort Gastrointestinal:  Denies nausea, heartburn or change in bowel habits Skin: Denies abnormal skin rashes Lymphatics: Denies new lymphadenopathy or easy bruising Neurological:Denies numbness, tingling or new weaknesses Behavioral/Psych: Mood is stable, no new changes  Extremities: No lower extremity edema Breast:  denies any pain or  lumps or nodules in either breasts All other systems were reviewed with the patient and are negative.  I have reviewed the past medical history, past surgical history, social history and family history with the patient and they are unchanged from previous note.  ALLERGIES:  is allergic to food; penicillins; and tetracyclines & related.  MEDICATIONS:  Current Outpatient Prescriptions  Medication Sig Dispense Refill  .  metFORMIN (GLUCOPHAGE) 500 MG tablet Take 1 tablet (500 mg total) by mouth 4 (four) times daily.    . tamoxifen (NOLVADEX) 20 MG tablet TAKE 1 TABLET DAILY (START AUGUST 1ST WEEK) 90 tablet 3   No current facility-administered medications for this visit.     PHYSICAL EXAMINATION: ECOG PERFORMANCE STATUS: 1 - Symptomatic but completely ambulatory  Vitals:   04/17/16 1201  BP: (!) 156/77  Pulse: 100  Resp: 20  Temp: 98.3 F (36.8 C)   Filed Weights   04/17/16 1201  Weight: 257 lb 3.2 oz (116.7 kg)    GENERAL:alert, no distress and comfortable SKIN: skin color, texture, turgor are normal, no rashes or significant lesions EYES: normal, Conjunctiva are pink and non-injected, sclera clear OROPHARYNX:no exudate, no erythema and lips, buccal mucosa, and tongue normal  NECK: supple, thyroid normal size, non-tender, without nodularity LYMPH:  no palpable lymphadenopathy in the cervical, axillary or inguinal LUNGS: clear to auscultation and percussion with normal breathing effort HEART: regular rate & rhythm and no murmurs and no lower extremity edema ABDOMEN:abdomen soft, non-tender and normal bowel sounds MUSCULOSKELETAL:no cyanosis of digits and no clubbing  NEURO: alert & oriented x 3 with fluent speech, no focal motor/sensory deficits EXTREMITIES: No lower extremity edema BREAST: No palpable masses or nodules in either right or left breasts. No palpable axillary supraclavicular or infraclavicular adenopathy no breast tenderness or nipple discharge. (exam performed in the presence of a chaperone)  LABORATORY DATA:  I have reviewed the data as listed   Chemistry      Component Value Date/Time   NA 140 06/02/2014 1530   NA 144 04/26/2014 1224   K 3.9 06/02/2014 1530   K 4.2 04/26/2014 1224   CL 100 06/02/2014 1530   CO2 30 06/02/2014 1530   CO2 27 04/26/2014 1224   BUN 11 06/02/2014 1530   BUN 14.7 04/26/2014 1224   CREATININE 0.88 06/02/2014 1530   CREATININE 1.0 04/26/2014  1224      Component Value Date/Time   CALCIUM 9.0 06/02/2014 1530   CALCIUM 9.5 04/26/2014 1224   ALKPHOS 71 04/26/2014 1224   AST 12 04/26/2014 1224   ALT 16 04/26/2014 1224   BILITOT 0.28 04/26/2014 1224       Lab Results  Component Value Date   WBC 6.3 04/26/2014   HGB 11.5 (L) 06/05/2014   HCT 36.9 04/26/2014   MCV 84.4 04/26/2014   PLT 336 04/26/2014   NEUTROABS 3.3 04/26/2014    ASSESSMENT & PLAN:  Breast cancer of upper-outer quadrant of right female breast Rt Lumpectomy 06/05/14: ILC 0.6 cm (rt Medial) 1.7 cm (Rt Lateral)Margins Neg, Grade 2 with LCIS, ER 100%, PR 99%, Ki 67 6%, Her 2 Neg, Oncotype DX recurrence score 8, risk of recurrence 6%, reexcision for margins benign, 0/3 lymph nodes negative T1b N0 M0 stage IA status post adjuvant radiation 07/24/2014 to 09/08/2014 Genetic testing: Normal  Current treatment: Adjuvant antiestrogen therapy with tamoxifen 20 mg daily started 09/25/2014 5-10 years  Tamoxifen toxicities: 1. Hot flashes : Continuing to wax and vane 2. Occasional sweats  Breast  cancer surveillance: 1. Breast exam on 02/15/16 does not reveal any abnormalities 2. Annual mammograms March 2018 benign, breast density category B The radiologist recommended that we obtain annual breast MRIs because there is a dumbbell-shaped clip in the lumpectomy scar. Plan: I will obtain a breast MRI since she has lobular cancer and she has lobular neoplasia as well.  Patient is moving to [facility] as part of her promotion for her job. She works for [employer]. She is very excited about this. I encouraged her to find a local oncologist to make it easier her. She plans to come to [facility] for her follow-up visits annually. However if she finds a Museum/gallery conservator. Happy to transfer her care over.  Return to clinic in 1 year for follow-up and breast exams   I spent 25 minutes talking to the patient of which more than half was spent in counseling  and coordination of care.  No orders of the defined types were placed in this encounter.  The patient has a good understanding of the overall plan. she agrees with it. she will call with any problems that may develop before the next visit here.   Rulon Eisenmenger, MD 04/17/16

## 2016-04-17 NOTE — Assessment & Plan Note (Signed)
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 Hot flashes : Continuing to wax and vane 2. Occasional sweats  Breast cancer surveillance: 1. Breast exam on 02/15/16 does not reveal any abnormalities 2. Annual mammograms March 2018 benign, breast density category B The radiologist recommended that we obtain annual breast MRIs because there is a dumbbell-shaped clip in the lumpectomy scar.  Obesity: I provided patient information on living well program to help lose weight  Patient is moving to [facility] as part of her promotion for her job. She works for [employer]. She is very excited about this. I encouraged her to find a local oncologist to make it easier her. She plans to come to [facility] for her follow-up visits annually. However if she finds a Museum/gallery conservator. Happy to transfer her care over.  Return to clinic in 1 year for follow-up and breast exams

## 2016-04-29 DIAGNOSIS — J069 Acute upper respiratory infection, unspecified: Secondary | ICD-10-CM | POA: Diagnosis not present

## 2016-05-07 ENCOUNTER — Ambulatory Visit
Admission: RE | Admit: 2016-05-07 | Discharge: 2016-05-07 | Disposition: A | Discharge status: Home / Self Care | Payer: 59 | Source: Ambulatory Visit | Attending: Hematology and Oncology | Admitting: Hematology and Oncology

## 2016-05-07 DIAGNOSIS — Z170 Estrogen receptor positive status [ER+]: Principal | ICD-10-CM

## 2016-05-07 DIAGNOSIS — C50411 Malignant neoplasm of upper-outer quadrant of right female breast: Secondary | ICD-10-CM

## 2016-05-07 DIAGNOSIS — Z86000 Personal history of in-situ neoplasm of breast: Secondary | ICD-10-CM | POA: Diagnosis not present

## 2016-05-07 MED ORDER — GADOBENATE DIMEGLUMINE 529 MG/ML IV SOLN
20.0000 mL | Freq: Once | INTRAVENOUS | Status: AC | PRN
  Administered 2016-05-07: 20 mL via INTRAVENOUS

## 2016-05-08 ENCOUNTER — Other Ambulatory Visit: Payer: Self-pay | Admitting: Hematology and Oncology

## 2016-05-08 DIAGNOSIS — J988 Other specified respiratory disorders: Secondary | ICD-10-CM | POA: Diagnosis not present

## 2016-05-08 DIAGNOSIS — R05 Cough: Secondary | ICD-10-CM | POA: Diagnosis not present

## 2016-05-08 DIAGNOSIS — J180 Bronchopneumonia, unspecified organism: Secondary | ICD-10-CM | POA: Diagnosis not present

## 2016-05-10 DIAGNOSIS — R062 Wheezing: Secondary | ICD-10-CM | POA: Diagnosis not present

## 2016-05-10 DIAGNOSIS — J180 Bronchopneumonia, unspecified organism: Secondary | ICD-10-CM | POA: Diagnosis not present

## 2016-05-12 ENCOUNTER — Other Ambulatory Visit: Payer: Self-pay | Admitting: *Deleted

## 2016-05-12 ENCOUNTER — Telehealth: Payer: Self-pay | Admitting: *Deleted

## 2016-05-12 ENCOUNTER — Other Ambulatory Visit: Payer: Self-pay | Admitting: Hematology and Oncology

## 2016-05-12 DIAGNOSIS — C50411 Malignant neoplasm of upper-outer quadrant of right female breast: Secondary | ICD-10-CM

## 2016-05-12 DIAGNOSIS — Z170 Estrogen receptor positive status [ER+]: Secondary | ICD-10-CM

## 2016-05-12 DIAGNOSIS — N6320 Unspecified lump in the left breast, unspecified quadrant: Secondary | ICD-10-CM

## 2016-05-12 NOTE — Telephone Encounter (Signed)
Spoke with patient to discuss MRI results.  She needs biopsies of the 3 areas in her left breast.  I have scheduled these for 05/20/16 at 7am and patient is aware of this appointment.

## 2016-05-13 DIAGNOSIS — E119 Type 2 diabetes mellitus without complications: Secondary | ICD-10-CM | POA: Diagnosis not present

## 2016-05-20 ENCOUNTER — Ambulatory Visit
Admission: RE | Admit: 2016-05-20 | Discharge: 2016-05-20 | Disposition: A | Discharge status: Home / Self Care | Payer: 59 | Source: Ambulatory Visit | Attending: Hematology and Oncology | Admitting: Hematology and Oncology

## 2016-05-20 DIAGNOSIS — C50411 Malignant neoplasm of upper-outer quadrant of right female breast: Secondary | ICD-10-CM

## 2016-05-20 DIAGNOSIS — N6320 Unspecified lump in the left breast, unspecified quadrant: Secondary | ICD-10-CM

## 2016-05-20 DIAGNOSIS — Z170 Estrogen receptor positive status [ER+]: Secondary | ICD-10-CM

## 2016-05-20 DIAGNOSIS — N6489 Other specified disorders of breast: Secondary | ICD-10-CM | POA: Diagnosis not present

## 2016-05-20 DIAGNOSIS — C50912 Malignant neoplasm of unspecified site of left female breast: Secondary | ICD-10-CM | POA: Diagnosis not present

## 2016-05-20 MED ORDER — GADOBENATE DIMEGLUMINE 529 MG/ML IV SOLN
19.0000 mL | Freq: Once | INTRAVENOUS | Status: AC | PRN
  Administered 2016-05-20: 19 mL via INTRAVENOUS

## 2016-05-22 DIAGNOSIS — J189 Pneumonia, unspecified organism: Secondary | ICD-10-CM | POA: Diagnosis not present

## 2016-05-22 DIAGNOSIS — C50919 Malignant neoplasm of unspecified site of unspecified female breast: Secondary | ICD-10-CM | POA: Diagnosis not present

## 2016-05-26 ENCOUNTER — Telehealth: Payer: Self-pay | Admitting: Hematology and Oncology

## 2016-05-26 NOTE — Telephone Encounter (Signed)
sw pt to confirm 4/26 appt at 10 am per  LOS

## 2016-05-29 DIAGNOSIS — C50912 Malignant neoplasm of unspecified site of left female breast: Secondary | ICD-10-CM | POA: Diagnosis not present

## 2016-05-29 DIAGNOSIS — C50911 Malignant neoplasm of unspecified site of right female breast: Secondary | ICD-10-CM | POA: Diagnosis not present

## 2016-05-30 DIAGNOSIS — C50919 Malignant neoplasm of unspecified site of unspecified female breast: Secondary | ICD-10-CM | POA: Diagnosis not present

## 2016-05-30 DIAGNOSIS — E119 Type 2 diabetes mellitus without complications: Secondary | ICD-10-CM | POA: Diagnosis not present

## 2016-06-05 ENCOUNTER — Ambulatory Visit (HOSPITAL_BASED_OUTPATIENT_CLINIC_OR_DEPARTMENT_OTHER): Payer: 59 | Admitting: Hematology and Oncology

## 2016-06-05 ENCOUNTER — Encounter: Payer: Self-pay | Admitting: Hematology and Oncology

## 2016-06-05 DIAGNOSIS — Z79811 Long term (current) use of aromatase inhibitors: Secondary | ICD-10-CM

## 2016-06-05 DIAGNOSIS — C50411 Malignant neoplasm of upper-outer quadrant of right female breast: Secondary | ICD-10-CM

## 2016-06-05 DIAGNOSIS — Z853 Personal history of malignant neoplasm of breast: Secondary | ICD-10-CM

## 2016-06-05 DIAGNOSIS — C50912 Malignant neoplasm of unspecified site of left female breast: Secondary | ICD-10-CM

## 2016-06-05 DIAGNOSIS — Z170 Estrogen receptor positive status [ER+]: Secondary | ICD-10-CM

## 2016-06-05 MED ORDER — GLIMEPIRIDE 4 MG PO TABS: 4.0000 mg | ORAL_TABLET | Freq: Every day | ORAL | Status: AC

## 2016-06-05 MED ORDER — ATORVASTATIN CALCIUM 10 MG PO TABS: 10.0000 mg | ORAL_TABLET | Freq: Every day | ORAL | Status: AC

## 2016-06-05 MED ORDER — ANASTROZOLE 1 MG PO TABS: 1.0000 mg | ORAL_TABLET | Freq: Every day | ORAL | 3 refills | Status: DC

## 2016-06-05 NOTE — Progress Notes (Signed)
Patient Care Team: Kathyrn Lass, MD as PCP - General (Family Medicine) Autumn Messing III, MD as Consulting Physician (General Surgery) Nicholas Lose, MD as Consulting Physician (Hematology and Oncology) Gery Pray, MD as Consulting Physician (Radiation Oncology) Rockwell Germany, RN as Registered Nurse Mauro Kaufmann, RN as Registered Nurse Holley Bouche, NP as Nurse Practitioner (Nurse Practitioner) Sylvan Cheese, NP as Nurse Practitioner (Nurse Practitioner)  DIAGNOSIS:  Encounter Diagnosis  Name Primary?  . Malignant neoplasm of upper-outer quadrant of right breast in female, estrogen receptor positive ([place])     SUMMARY OF ONCOLOGIC HISTORY:   Breast cancer of upper-outer quadrant of right female breast ([place])   04/07/2014 Mammogram    Right breast: calcifications       04/14/2014 Initial Biopsy    Right breast needle biopsy: Mammary carcinoma in situ with calcifications at 9 to 10:00 position ER+ (95%), PR+ (66%), DCIS plus LCIS, heterogeneous somewhat E Cadherin positive some negative      04/26/2014 Breast MRI    Biopsy-proven DCIS is identified in the outer quadrant; ~ 3 cm AP diameter linear clumped NME extends from the medial biopsy cavity posteriorly in the central breast which is suspicious for disease extension      04/26/2014 Clinical Stage    Stage 0: Tis Nx      06/05/2014 Definitive Surgery    Right lumpectomy [name]): ILC, grade 2/3, 0.6 cm, LCIS invasive ca focally present at inferior margin (rt Medial); ILC, grade 2-3, 1.7 cm with neg margins with LCIS (Rt Lateral) ER 100%, PR 99%, Ki 67 6%, HER2/neu neg (ratio 1.14)      06/05/2014 Oncotype testing    Score: 8 (ROR 6%)      06/05/2014 Pathologic Stage    Stage IA: mpT1c pNx      06/23/2014 Surgery    Right breast excision: Benign, 0/3 lymph nodes negative      07/24/2014 - 09/08/2014 Radiation Therapy    Adjuvant RT completed [name]): Right breast 45 Gy over 25 fractions.  Right breast boost  16 Gy over 8 fractions.  Total dose: 60 Gy.        09/25/2014 -  Anti-estrogen oral therapy    Tamoxifen 20 mg daily (Daune Divirgilio).  Planned duration of therapy 5-10 years.      11/02/2014 Procedure    Breast/Ovarian panel revealed no clinically significant variant at ATM, BARD1, BRCA1, BRCA2, BRIP1, CDH1, CHEK2, FANCC, MLH1, MSH2, MSH6, NBN, PALB2, PMS2, PTEN, RAD51C, RAD51D, TP53, and XRCC2      11/03/2014 Survivorship    Survivorship visit completed and copy of survivorship care plan provided to patient.      05/07/2016 Breast MRI    Left breast non-mass enhancement spanning 6.9 cm. Small enhancing mass 5.6 cm, no lymph nodes. Right breast normal      05/20/2016 Pathology Results    Left breast biopsy posterior outer: Invasive lobular cancer with LCIS, ER 95%, PR 0%, Ki-67 20%, HER-2 negative ratio 1.25       CHIEF COMPLIANT: Recent evidence of relapsed left breast cancer  INTERVAL HISTORY: [name] [name] is a 51 year old with above-mentioned history left breast cancer a previously undergone lumpectomy followed by radiation and had been on tamoxifen therapy. She had a mammogram which was negative but then subsequently had MRI which revealed 6.9 cm area of enhancement which was biopsy-proven to be invasive lobular cancer. She is here today to discuss overall treatment plan. She is very sad about the recurrence because she was  originally planning to move to [place] to take up position for [employer] but because of this recurrence she is planning to stay back. She has seen Dr. [name] who is going to work with plastic surgery to perform bilateral mastectomies.  REVIEW OF SYSTEMS:   Constitutional: Denies fevers, chills or abnormal weight loss Eyes: Denies blurriness of vision Ears, nose, mouth, throat, and face: Denies mucositis or sore throat Respiratory: Denies cough, dyspnea or wheezes Cardiovascular: Denies palpitation, chest discomfort Gastrointestinal:  Denies nausea, heartburn or  change in bowel habits Skin: Denies abnormal skin rashes Lymphatics: Denies new lymphadenopathy or easy bruising Neurological:Denies numbness, tingling or new weaknesses Behavioral/Psych: Mood is stable, no new changes  Extremities: No lower extremity edema Breast:  denies any pain or lumps or nodules in either breasts All other systems were reviewed with the patient and are negative.  I have reviewed the past medical history, past surgical history, social history and family history with the patient and they are unchanged from previous note.  ALLERGIES:  is allergic to food; penicillins; and tetracyclines & related.  MEDICATIONS:  Current Outpatient Prescriptions  Medication Sig Dispense Refill  . anastrozole (ARIMIDEX) 1 MG tablet Take 1 tablet (1 mg total) by mouth daily. 90 tablet 3  . atorvastatin (LIPITOR) 10 MG tablet Take 1 tablet (10 mg total) by mouth daily.    Marland Kitchen glimepiride (AMARYL) 4 MG tablet Take 1 tablet (4 mg total) by mouth daily with breakfast.    . metFORMIN (GLUCOPHAGE) 500 MG tablet Take 1 tablet (500 mg total) by mouth 4 (four) times daily.     No current facility-administered medications for this visit.     PHYSICAL EXAMINATION: ECOG PERFORMANCE STATUS: 1 - Symptomatic but completely ambulatory  Vitals:   06/05/16 1035  BP: 103/72  Pulse: 87  Resp: 18  Temp: 97.9 F (36.6 C)   Filed Weights   06/05/16 1035  Weight: 243 lb 9.6 oz (110.5 kg)    GENERAL:alert, no distress and comfortable SKIN: skin color, texture, turgor are normal, no rashes or significant lesions EYES: normal, Conjunctiva are pink and non-injected, sclera clear OROPHARYNX:no exudate, no erythema and lips, buccal mucosa, and tongue normal  NECK: supple, thyroid normal size, non-tender, without nodularity LYMPH:  no palpable lymphadenopathy in the cervical, axillary or inguinal LUNGS: clear to auscultation and percussion with normal breathing effort HEART: regular rate & rhythm and no  murmurs and no lower extremity edema ABDOMEN:abdomen soft, non-tender and normal bowel sounds MUSCULOSKELETAL:no cyanosis of digits and no clubbing  NEURO: alert & oriented x 3 with fluent speech, no focal motor/sensory deficits EXTREMITIES: No lower extremity edema  LABORATORY DATA:  I have reviewed the data as listed   Chemistry      Component Value Date/Time   NA 140 06/02/2014 1530   NA 144 04/26/2014 1224   K 3.9 06/02/2014 1530   K 4.2 04/26/2014 1224   CL 100 06/02/2014 1530   CO2 30 06/02/2014 1530   CO2 27 04/26/2014 1224   BUN 11 06/02/2014 1530   BUN 14.7 04/26/2014 1224   CREATININE 0.88 06/02/2014 1530   CREATININE 1.0 04/26/2014 1224      Component Value Date/Time   CALCIUM 9.0 06/02/2014 1530   CALCIUM 9.5 04/26/2014 1224   ALKPHOS 71 04/26/2014 1224   AST 12 04/26/2014 1224   ALT 16 04/26/2014 1224   BILITOT 0.28 04/26/2014 1224       Lab Results  Component Value Date  WBC 6.3 04/26/2014   HGB 11.5 (L) 06/05/2014   HCT 36.9 04/26/2014   MCV 84.4 04/26/2014   PLT 336 04/26/2014   NEUTROABS 3.3 04/26/2014    ASSESSMENT & PLAN:  Breast cancer of upper-outer quadrant of right female breast Rt Lumpectomy 06/05/14: ILC 0.6 cm (rt Medial) 1.7 cm (Rt Lateral)Margins Neg, Grade 2 with LCIS, ER 100%, PR 99%, Ki 67 6%, Her 2 Neg, Oncotype DX recurrence score 8, risk of recurrence 6%, reexcision for margins benign, 0/3 lymph nodes negative T1b N0 M0 stage IA status post adjuvant radiation 07/24/2014 to 09/08/2014 Genetic testing: Normal  Current treatment: Adjuvant antiestrogen therapy with tamoxifen 20 mg daily started 09/25/2014 Stopped 06/05/2016 and switched to anastrozole 1 mg daily  Breast MRI 05/08/2016: Left breast non-mass enhancement spanning 6.9 cm. Small enhancing mass 5.6 cm, no lymph nodes. Right breast normal  Breast biopsy 05/20/2016: Left breast biopsy posterior outer: Invasive lobular cancer with LCIS, ER 95%, PR 0%, Ki-67 20%, HER-2 negative  ratio 1.25  Recommendation: 1. Patient will need a mastectomy 2. we will need to consider repeating Oncotype DX on the final pathology. 3. Adjuvant hormonal therapy With anastrozole 1 mg daily to start 06/05/2016  Return to clinic after surgery to discuss pathology report. Patient had earlier made up her plans to move to [facility]  I spent 25 minutes talking to the patient of which more than half was spent in counseling and coordination of care.  No orders of the defined types were placed in this encounter.  The patient has a good understanding of the overall plan. she agrees with it. she will call with any problems that may develop before the next visit here.   Rulon Eisenmenger, MD 06/05/16

## 2016-06-05 NOTE — Assessment & Plan Note (Signed)
Rt Lumpectomy 06/05/14: ILC 0.6 cm (rt Medial) 1.7 cm (Rt Lateral)Margins Neg, Grade 2 with LCIS, ER 100%, PR 99%, Ki 67 6%, Her 2 Neg, Oncotype DX recurrence score 8, risk of recurrence 6%, reexcision for margins benign, 0/3 lymph nodes negative T1b N0 M0 stage IA status post adjuvant radiation 07/24/2014 to 09/08/2014 Genetic testing: Normal  Current treatment: Adjuvant antiestrogen therapy with tamoxifen 20 mg daily started 09/25/2014 5-10 years  Breast MRI 05/08/2016: Left breast non-mass enhancement spanning 6.9 cm. Small enhancing mass 5.6 cm, no lymph nodes. Right breast normal  Breast biopsy 05/20/2016: Left breast biopsy posterior outer: Invasive lobular cancer with LCIS, ER 95%, PR 0%, Ki-67 20%, HER-2 negative ratio 1.25  Recommendation: 1. Patient will need a mastectomy 2. we will need to consider repeating Oncotype DX on the final pathology. 3. Adjuvant hormonal therapy will need to be changed to aromatase inhibitor therapy plus ovarian suppression since she failed to tamoxifen treatment.  Return to clinic after surgery to discuss pathology report. Patient had earlier made up her plans to move to [facility]

## 2016-06-10 DIAGNOSIS — Z170 Estrogen receptor positive status [ER+]: Secondary | ICD-10-CM | POA: Diagnosis not present

## 2016-06-10 DIAGNOSIS — Z853 Personal history of malignant neoplasm of breast: Secondary | ICD-10-CM | POA: Insufficient documentation

## 2016-06-10 DIAGNOSIS — C50912 Malignant neoplasm of unspecified site of left female breast: Secondary | ICD-10-CM | POA: Diagnosis not present

## 2016-06-16 ENCOUNTER — Ambulatory Visit: Payer: Self-pay | Admitting: General Surgery

## 2016-06-16 DIAGNOSIS — Z170 Estrogen receptor positive status [ER+]: Principal | ICD-10-CM

## 2016-06-16 DIAGNOSIS — C50112 Malignant neoplasm of central portion of left female breast: Secondary | ICD-10-CM

## 2016-06-23 ENCOUNTER — Telehealth: Payer: Self-pay | Admitting: Hematology and Oncology

## 2016-06-23 NOTE — Telephone Encounter (Signed)
Confirmed 6/25 appt per sch msg

## 2016-07-08 DIAGNOSIS — C50912 Malignant neoplasm of unspecified site of left female breast: Secondary | ICD-10-CM | POA: Diagnosis not present

## 2016-07-08 DIAGNOSIS — Z9011 Acquired absence of right breast and nipple: Secondary | ICD-10-CM | POA: Diagnosis not present

## 2016-07-15 ENCOUNTER — Ambulatory Visit: Payer: Self-pay | Admitting: Plastic Surgery

## 2016-07-15 DIAGNOSIS — C50411 Malignant neoplasm of upper-outer quadrant of right female breast: Secondary | ICD-10-CM

## 2016-07-15 DIAGNOSIS — Z170 Estrogen receptor positive status [ER+]: Principal | ICD-10-CM

## 2016-07-15 NOTE — H&P (Signed)
[name] [name] is an 50 y.o. female.   Chief Complaint: Breast cancer HPI: The patient is a 50 yrs old bf here for evaluation for breast reconstruction.  She was diagnosed with RIGHT upper-outer quadrant breast cancer, ER/PR positive, Her 2 negative.  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  She underwent right breast lumpectomy with radiation (08/2014) and tamoxifen therapy.  The Tamoxifen was stopped 4/26 and switched to anastrozole daily.   She had a mammogram which was negative but MRI revealed a 6.9 cm area of enhancement with a biopsy proven invasive lobular cancer.  She is now interested in bilateral mastectomies.  She is 5 feet 7 inches tall, weight is 244 pounds.  Preop bra= 38C.  The sternal notch to NAD on the right is 28 cm and 30 cm on the left.  She is planning on having bilateral skin sparing mastectomies and not trying to save the NAC.   Past Medical History:  Diagnosis Date  . Breast cancer (HCC)   . Diabetes mellitus without complication (HCC)   . Radiation 07/24/14-09/08/14   Right upper breast    Past Surgical History:  Procedure Laterality Date  . AXILLARY SENTINEL NODE BIOPSY Right 06/23/2014   Procedure: AXILLARY SENTINEL NODE BIOPSY;  Surgeon: Paul Toth III, MD;  Location: [facility];  Service: General;  Laterality: Right;  . BREAST LUMPECTOMY WITH NEEDLE LOCALIZATION Right 06/05/2014   Procedure: RIGHT BREAST LUMPECTOMY WITH NEEDLE LOCALIZATION;  Surgeon: Paul Toth III, MD;  Location: [facility];  Service: General;  Laterality: Right;  . RE-EXCISION OF BREAST LUMPECTOMY Right 06/23/2014   Procedure: RE-EXCISION OF RIGHT BREAST INFERIOR MEDIAL MARGIN AND SENTINEL LYMPH NODE BIOPSY;  Surgeon: Paul Toth III, MD;  Location: [facility];  Service: General;  Laterality: Right;    Family History  Problem Relation Age of Onset  . Colon cancer Mother        dx. late 70s  .  Congestive Heart Failure Father        smoker  . Cancer Maternal Aunt        unspecified type; dx. 70s  . Heart attack Maternal Uncle   . Breast cancer Paternal Aunt        dx. 70s  . Heart attack Paternal Uncle   . Heart attack Paternal Grandmother   . Heart attack Paternal Grandfather   . Congestive Heart Failure Paternal Aunt   . Cancer Cousin        unspecified type/unspecified type  . Breast cancer Cousin 52   Social History:  reports that she has never smoked. She has never used smokeless tobacco. She reports that she does not drink alcohol or use drugs.  Allergies:  Allergies  Allergen Reactions  . Food     shellfish  . Penicillins   . Tetracyclines & Related      (Not in a hospital admission)  No results found for this or any previous visit (from the past 48 hour(s)). No results found.  Review of Systems  Constitutional: Negative.   HENT: Negative.   Eyes: Negative.   Respiratory: Negative.   Cardiovascular: Negative.   Gastrointestinal: Negative.   Genitourinary: Negative.   Musculoskeletal: Negative.   Skin: Negative.   Neurological: Negative.   Psychiatric/Behavioral: Negative.     There were no vitals taken for this visit. Physical Exam  Constitutional: She is oriented to person, place, and time. She appears   well-developed and well-nourished.  HENT:  Head: Normocephalic and atraumatic.  Eyes: EOM are normal. Pupils are equal, round, and reactive to light.  Cardiovascular: Normal rate.   Respiratory: Effort normal.  GI: She exhibits no distension.  Musculoskeletal: She exhibits no edema.  Neurological: She is alert and oriented to person, place, and time.  Skin: Skin is warm. No rash noted. No erythema. No pallor.  Psychiatric: She has a normal mood and affect. Her behavior is normal. Judgment and thought content normal.     Assessment/Plan A general discussion regarding all available methods of breast reconstruction were discussed. The types  of reconstructions described included.  1. Tissue expander and implant based reconstruction, both single and multi-stage approaches.  2. Autologous only reconstructions, including free abdominal-tissue based reconstructions.  3. Combination procedures, particularly latissismus dorsi flaps combined with either expanders or implants.  For each of the reconstruction methods mentioned above, the risks, benefits, alternatives, scarring, and recovery time were discussed in great detail. Specific risks detailed included bleeding, infection, hematoma, seroma, scarring, pain, wound healing complications, flap loss, fat necrosis, capsular contracture, need for implant removal, donor site complications, bulge, hernia, umbilical necrosis, need for urgent reoperation, and need for dressing changes were discussed.   Assessment  Once all reconstruction options were presented, a focused discussion was had regarding the patient's suitability for each of these procedures.  A total of 50 minutes of face-to-face time was spent in this encounter, of which >50% was spent in counseling.  The patient was given time to look through the picture book.  She is at risk of the right breast not stretching with expansion due to the radiation.    I spoke with the patient on the phone and she has decided to move ahead with the immediate reconstruction bilateral breasts with expander and FlexHD.  Wallace Going, DO 07/15/2016, 2:55 PM

## 2016-07-17 ENCOUNTER — Encounter (HOSPITAL_BASED_OUTPATIENT_CLINIC_OR_DEPARTMENT_OTHER): Payer: Self-pay | Admitting: *Deleted

## 2016-07-17 ENCOUNTER — Ambulatory Visit: Payer: Self-pay | Admitting: Plastic Surgery

## 2016-07-18 ENCOUNTER — Encounter (HOSPITAL_BASED_OUTPATIENT_CLINIC_OR_DEPARTMENT_OTHER)
Admission: RE | Admit: 2016-07-18 | Discharge: 2016-07-18 | Disposition: A | Discharge status: Home / Self Care | Payer: 59 | Source: Ambulatory Visit | Attending: General Surgery | Admitting: General Surgery

## 2016-07-18 ENCOUNTER — Encounter (HOSPITAL_BASED_OUTPATIENT_CLINIC_OR_DEPARTMENT_OTHER): Payer: Self-pay | Admitting: *Deleted

## 2016-07-18 DIAGNOSIS — E119 Type 2 diabetes mellitus without complications: Secondary | ICD-10-CM | POA: Diagnosis not present

## 2016-07-18 DIAGNOSIS — Z01810 Encounter for preprocedural cardiovascular examination: Secondary | ICD-10-CM | POA: Insufficient documentation

## 2016-07-18 DIAGNOSIS — Z853 Personal history of malignant neoplasm of breast: Secondary | ICD-10-CM | POA: Diagnosis not present

## 2016-07-18 LAB — BASIC METABOLIC PANEL
Anion gap: 10 (ref 5–15)
BUN: 10 mg/dL (ref 6–20)
CHLORIDE: 104 mmol/L (ref 101–111)
CO2: 24 mmol/L (ref 22–32)
CREATININE: 0.82 mg/dL (ref 0.44–1.00)
Calcium: 9.1 mg/dL (ref 8.9–10.3)
GFR calc Af Amer: >60 mL/min (ref >60)
GFR calc non Af Amer: >60 mL/min (ref >60)
GLUCOSE: 134 mg/dL — AB (ref 65–99)
Potassium: 4 mmol/L (ref 3.5–5.1)
Sodium: 138 mmol/L (ref 135–145)

## 2016-07-18 NOTE — Progress Notes (Signed)
Pt given Boost drink and instructed to drink by 0530 day of surgery with teach back method.

## 2016-07-22 ENCOUNTER — Ambulatory Visit: Payer: Self-pay | Admitting: Plastic Surgery

## 2016-07-24 ENCOUNTER — Encounter (HOSPITAL_BASED_OUTPATIENT_CLINIC_OR_DEPARTMENT_OTHER)
Admission: RE | Disposition: A | Discharge status: Home / Self Care | Payer: Self-pay | Source: Ambulatory Visit | Attending: Plastic Surgery

## 2016-07-24 ENCOUNTER — Ambulatory Visit (HOSPITAL_BASED_OUTPATIENT_CLINIC_OR_DEPARTMENT_OTHER)
Admission: RE | Admit: 2016-07-24 | Discharge: 2016-07-25 | Disposition: A | Discharge status: Home / Self Care | Payer: 59 | Source: Ambulatory Visit | Attending: Plastic Surgery | Admitting: Plastic Surgery

## 2016-07-24 ENCOUNTER — Ambulatory Visit (HOSPITAL_BASED_OUTPATIENT_CLINIC_OR_DEPARTMENT_OTHER): Payer: 59 | Admitting: Anesthesiology

## 2016-07-24 ENCOUNTER — Encounter (HOSPITAL_BASED_OUTPATIENT_CLINIC_OR_DEPARTMENT_OTHER): Payer: Self-pay | Admitting: *Deleted

## 2016-07-24 ENCOUNTER — Encounter (HOSPITAL_COMMUNITY)
Admission: RE | Admit: 2016-07-24 | Discharge: 2016-07-24 | Disposition: A | Discharge status: Home / Self Care | Payer: 59 | Source: Ambulatory Visit | Attending: General Surgery | Admitting: General Surgery

## 2016-07-24 DIAGNOSIS — C50411 Malignant neoplasm of upper-outer quadrant of right female breast: Secondary | ICD-10-CM | POA: Diagnosis not present

## 2016-07-24 DIAGNOSIS — Z881 Allergy status to other antibiotic agents status: Secondary | ICD-10-CM | POA: Diagnosis not present

## 2016-07-24 DIAGNOSIS — C50919 Malignant neoplasm of unspecified site of unspecified female breast: Secondary | ICD-10-CM | POA: Diagnosis present

## 2016-07-24 DIAGNOSIS — E119 Type 2 diabetes mellitus without complications: Secondary | ICD-10-CM | POA: Diagnosis not present

## 2016-07-24 DIAGNOSIS — Z7984 Long term (current) use of oral hypoglycemic drugs: Secondary | ICD-10-CM | POA: Insufficient documentation

## 2016-07-24 DIAGNOSIS — Z809 Family history of malignant neoplasm, unspecified: Secondary | ICD-10-CM | POA: Insufficient documentation

## 2016-07-24 DIAGNOSIS — Z880 Allergy status to penicillin: Secondary | ICD-10-CM | POA: Insufficient documentation

## 2016-07-24 DIAGNOSIS — Z170 Estrogen receptor positive status [ER+]: Secondary | ICD-10-CM

## 2016-07-24 DIAGNOSIS — Z853 Personal history of malignant neoplasm of breast: Secondary | ICD-10-CM | POA: Diagnosis not present

## 2016-07-24 DIAGNOSIS — Z800 Family history of malignant neoplasm of digestive organs: Secondary | ICD-10-CM | POA: Insufficient documentation

## 2016-07-24 DIAGNOSIS — N6031 Fibrosclerosis of right breast: Secondary | ICD-10-CM | POA: Diagnosis not present

## 2016-07-24 DIAGNOSIS — C50512 Malignant neoplasm of lower-outer quadrant of left female breast: Secondary | ICD-10-CM | POA: Diagnosis not present

## 2016-07-24 DIAGNOSIS — Z6837 Body mass index (BMI) 37.0-37.9, adult: Secondary | ICD-10-CM | POA: Insufficient documentation

## 2016-07-24 DIAGNOSIS — Z803 Family history of malignant neoplasm of breast: Secondary | ICD-10-CM | POA: Insufficient documentation

## 2016-07-24 DIAGNOSIS — C50912 Malignant neoplasm of unspecified site of left female breast: Secondary | ICD-10-CM | POA: Diagnosis present

## 2016-07-24 DIAGNOSIS — C50112 Malignant neoplasm of central portion of left female breast: Secondary | ICD-10-CM

## 2016-07-24 DIAGNOSIS — N6011 Diffuse cystic mastopathy of right breast: Secondary | ICD-10-CM | POA: Diagnosis not present

## 2016-07-24 DIAGNOSIS — C50911 Malignant neoplasm of unspecified site of right female breast: Secondary | ICD-10-CM | POA: Diagnosis not present

## 2016-07-24 DIAGNOSIS — G8918 Other acute postprocedural pain: Secondary | ICD-10-CM | POA: Diagnosis not present

## 2016-07-24 HISTORY — PX: BREAST RECONSTRUCTION WITH PLACEMENT OF TISSUE EXPANDER AND FLEX HD (ACELLULAR HYDRATED DERMIS): SHX6295

## 2016-07-24 HISTORY — PX: MASTECTOMY W/ SENTINEL NODE BIOPSY: SHX2001

## 2016-07-24 HISTORY — DX: Personal history of irradiation: Z92.3

## 2016-07-24 LAB — GLUCOSE, CAPILLARY: Glucose-Capillary: 166 mg/dL — ABNORMAL HIGH (ref 65–99)

## 2016-07-24 SURGERY — MASTECTOMY WITH SENTINEL LYMPH NODE BIOPSY
Anesthesia: General | Site: Breast | Laterality: Bilateral

## 2016-07-24 MED ORDER — PROPOFOL 10 MG/ML IV BOLUS
INTRAVENOUS | Status: DC | PRN
  Administered 2016-07-24: 200 mg via INTRAVENOUS

## 2016-07-24 MED ORDER — FENTANYL CITRATE (PF) 100 MCG/2ML IJ SOLN
INTRAMUSCULAR | Status: AC
  Filled 2016-07-24: qty 2

## 2016-07-24 MED ORDER — METOPROLOL TARTRATE 5 MG/5ML IV SOLN
5.0000 mg | Freq: Four times a day (QID) | INTRAVENOUS | Status: DC | PRN
  Administered 2016-07-24: 5 mg via INTRAVENOUS

## 2016-07-24 MED ORDER — ROCURONIUM BROMIDE 100 MG/10ML IV SOLN
INTRAVENOUS | Status: DC | PRN
  Administered 2016-07-24 (×2): 10 mg via INTRAVENOUS
  Administered 2016-07-24: 50 mg via INTRAVENOUS
  Administered 2016-07-24: 10 mg via INTRAVENOUS

## 2016-07-24 MED ORDER — SCOPOLAMINE 1 MG/3DAYS TD PT72: 1.0000 | MEDICATED_PATCH | Freq: Once | TRANSDERMAL | Status: DC | PRN

## 2016-07-24 MED ORDER — DIPHENHYDRAMINE HCL 50 MG/ML IJ SOLN: 12.5000 mg | Freq: Four times a day (QID) | INTRAMUSCULAR | Status: DC | PRN

## 2016-07-24 MED ORDER — LIDOCAINE 2% (20 MG/ML) 5 ML SYRINGE
INTRAMUSCULAR | Status: DC | PRN
  Administered 2016-07-24: 100 mg via INTRAVENOUS

## 2016-07-24 MED ORDER — NAPROXEN 500 MG PO TABS
500.0000 mg | ORAL_TABLET | Freq: Two times a day (BID) | ORAL | Status: DC | PRN
  Administered 2016-07-24: 500 mg via ORAL
  Filled 2016-07-24: qty 2

## 2016-07-24 MED ORDER — PHENYLEPHRINE HCL 10 MG/ML IJ SOLN
INTRAMUSCULAR | Status: DC | PRN
  Administered 2016-07-24: 40 ug via INTRAVENOUS
  Administered 2016-07-24: 80 ug via INTRAVENOUS

## 2016-07-24 MED ORDER — CHLORHEXIDINE GLUCONATE CLOTH 2 % EX PADS: 6.0000 | MEDICATED_PAD | Freq: Once | CUTANEOUS | Status: DC

## 2016-07-24 MED ORDER — CIPROFLOXACIN IN D5W 400 MG/200ML IV SOLN: 400.0000 mg | INTRAVENOUS | Status: DC

## 2016-07-24 MED ORDER — MIDAZOLAM HCL 2 MG/2ML IJ SOLN
1.0000 mg | INTRAMUSCULAR | Status: DC | PRN
  Administered 2016-07-24: 2 mg via INTRAVENOUS

## 2016-07-24 MED ORDER — METOCLOPRAMIDE HCL 5 MG/ML IJ SOLN: 10.0000 mg | Freq: Once | INTRAMUSCULAR | Status: DC | PRN

## 2016-07-24 MED ORDER — ESMOLOL HCL 100 MG/10ML IV SOLN
INTRAVENOUS | Status: AC
  Filled 2016-07-24: qty 20

## 2016-07-24 MED ORDER — LIDOCAINE 2% (20 MG/ML) 5 ML SYRINGE
INTRAMUSCULAR | Status: AC
  Filled 2016-07-24: qty 5

## 2016-07-24 MED ORDER — CIPROFLOXACIN IN D5W 400 MG/200ML IV SOLN
400.0000 mg | Freq: Two times a day (BID) | INTRAVENOUS | Status: DC
  Administered 2016-07-24 – 2016-07-25 (×2): 400 mg via INTRAVENOUS
  Filled 2016-07-24 (×2): qty 200

## 2016-07-24 MED ORDER — MAGNESIUM HYDROXIDE 400 MG/5ML PO SUSP: 30.0000 mL | Freq: Every day | ORAL | Status: DC | PRN

## 2016-07-24 MED ORDER — MIDAZOLAM HCL 2 MG/2ML IJ SOLN
INTRAMUSCULAR | Status: AC
  Filled 2016-07-24: qty 2

## 2016-07-24 MED ORDER — SODIUM CHLORIDE 0.9 % IR SOLN
Status: DC | PRN
  Administered 2016-07-24: 500 mL

## 2016-07-24 MED ORDER — SODIUM CHLORIDE 0.9 % IJ SOLN
INTRAMUSCULAR | Status: AC
  Filled 2016-07-24: qty 10

## 2016-07-24 MED ORDER — SUGAMMADEX SODIUM 200 MG/2ML IV SOLN
INTRAVENOUS | Status: DC | PRN
  Administered 2016-07-24: 200 mg via INTRAVENOUS

## 2016-07-24 MED ORDER — ONDANSETRON HCL 4 MG/2ML IJ SOLN
INTRAMUSCULAR | Status: AC
  Filled 2016-07-24: qty 2

## 2016-07-24 MED ORDER — GABAPENTIN 300 MG PO CAPS
300.0000 mg | ORAL_CAPSULE | ORAL | Status: AC
  Administered 2016-07-24: 300 mg via ORAL

## 2016-07-24 MED ORDER — METHYLENE BLUE 0.5 % INJ SOLN
INTRAVENOUS | Status: AC
  Filled 2016-07-24: qty 10

## 2016-07-24 MED ORDER — METOPROLOL TARTRATE 5 MG/5ML IV SOLN
INTRAVENOUS | Status: AC
  Filled 2016-07-24: qty 5

## 2016-07-24 MED ORDER — LACTATED RINGERS IV SOLN
INTRAVENOUS | Status: DC
  Administered 2016-07-24 (×3): via INTRAVENOUS

## 2016-07-24 MED ORDER — GLIMEPIRIDE 4 MG PO TABS: 4.0000 mg | ORAL_TABLET | Freq: Every day | ORAL | Status: DC

## 2016-07-24 MED ORDER — BUPIVACAINE HCL (PF) 0.25 % IJ SOLN
INTRAMUSCULAR | Status: AC
  Filled 2016-07-24: qty 30

## 2016-07-24 MED ORDER — VANCOMYCIN HCL IN DEXTROSE 1-5 GM/200ML-% IV SOLN
1000.0000 mg | INTRAVENOUS | Status: AC
  Administered 2016-07-24: 1000 mg via INTRAVENOUS

## 2016-07-24 MED ORDER — HYDROCODONE-ACETAMINOPHEN 5-325 MG PO TABS
1.0000 | ORAL_TABLET | ORAL | Status: DC | PRN
  Administered 2016-07-24 – 2016-07-25 (×4): 2 via ORAL
  Filled 2016-07-24 (×4): qty 2

## 2016-07-24 MED ORDER — PHENYLEPHRINE 40 MCG/ML (10ML) SYRINGE FOR IV PUSH (FOR BLOOD PRESSURE SUPPORT)
PREFILLED_SYRINGE | INTRAVENOUS | Status: AC
  Filled 2016-07-24: qty 10

## 2016-07-24 MED ORDER — DEXAMETHASONE SODIUM PHOSPHATE 10 MG/ML IJ SOLN
INTRAMUSCULAR | Status: DC | PRN
  Administered 2016-07-24: 10 mg via INTRAVENOUS

## 2016-07-24 MED ORDER — ACETAMINOPHEN 500 MG PO TABS
ORAL_TABLET | ORAL | Status: AC
  Filled 2016-07-24: qty 2

## 2016-07-24 MED ORDER — KCL IN DEXTROSE-NACL 20-5-0.45 MEQ/L-%-% IV SOLN
INTRAVENOUS | Status: DC
  Administered 2016-07-24: 15:00:00 via INTRAVENOUS
  Filled 2016-07-24: qty 1000

## 2016-07-24 MED ORDER — CELECOXIB 200 MG PO CAPS
ORAL_CAPSULE | ORAL | Status: AC
  Filled 2016-07-24: qty 2

## 2016-07-24 MED ORDER — GABAPENTIN 300 MG PO CAPS
ORAL_CAPSULE | ORAL | Status: AC
  Filled 2016-07-24: qty 1

## 2016-07-24 MED ORDER — ESMOLOL HCL 100 MG/10ML IV SOLN
INTRAVENOUS | Status: DC | PRN
  Administered 2016-07-24: 20 mg via INTRAVENOUS
  Administered 2016-07-24 (×3): 10 mg via INTRAVENOUS

## 2016-07-24 MED ORDER — ACETAMINOPHEN 500 MG PO TABS
1000.0000 mg | ORAL_TABLET | ORAL | Status: AC
  Administered 2016-07-24: 1000 mg via ORAL

## 2016-07-24 MED ORDER — TECHNETIUM TC 99M SULFUR COLLOID FILTERED: 1.0000 | Freq: Once | INTRAVENOUS | Status: DC | PRN

## 2016-07-24 MED ORDER — DIAZEPAM 2 MG PO TABS
2.0000 mg | ORAL_TABLET | Freq: Two times a day (BID) | ORAL | Status: DC | PRN
  Administered 2016-07-24: 2 mg via ORAL
  Filled 2016-07-24: qty 1

## 2016-07-24 MED ORDER — LACTATED RINGERS IV SOLN: INTRAVENOUS | Status: DC

## 2016-07-24 MED ORDER — BISACODYL 10 MG RE SUPP: 10.0000 mg | Freq: Every day | RECTAL | Status: DC | PRN

## 2016-07-24 MED ORDER — SODIUM CHLORIDE 0.9 % IJ SOLN
INTRAMUSCULAR | Status: DC | PRN
  Administered 2016-07-24: 20 mL via INTRAVENOUS
  Administered 2016-07-24: 20 mL via PERINEURAL

## 2016-07-24 MED ORDER — ONDANSETRON HCL 4 MG/2ML IJ SOLN
INTRAMUSCULAR | Status: DC | PRN
  Administered 2016-07-24: 4 mg via INTRAVENOUS

## 2016-07-24 MED ORDER — DEXAMETHASONE SODIUM PHOSPHATE 10 MG/ML IJ SOLN
INTRAMUSCULAR | Status: AC
  Filled 2016-07-24: qty 1

## 2016-07-24 MED ORDER — STERILE WATER FOR IRRIGATION IR SOLN
Status: DC | PRN
  Administered 2016-07-24: 1

## 2016-07-24 MED ORDER — FENTANYL CITRATE (PF) 100 MCG/2ML IJ SOLN
50.0000 ug | INTRAMUSCULAR | Status: AC | PRN
  Administered 2016-07-24 (×2): 100 ug via INTRAVENOUS
  Administered 2016-07-24 (×4): 50 ug via INTRAVENOUS

## 2016-07-24 MED ORDER — METFORMIN HCL 500 MG PO TABS
500.0000 mg | ORAL_TABLET | Freq: Four times a day (QID) | ORAL | Status: DC
  Administered 2016-07-24 (×2): 500 mg via ORAL

## 2016-07-24 MED ORDER — FENTANYL CITRATE (PF) 100 MCG/2ML IJ SOLN
25.0000 ug | INTRAMUSCULAR | Status: DC | PRN
  Administered 2016-07-24 (×2): 50 ug via INTRAVENOUS

## 2016-07-24 MED ORDER — ROCURONIUM BROMIDE 10 MG/ML (PF) SYRINGE
PREFILLED_SYRINGE | INTRAVENOUS | Status: AC
  Filled 2016-07-24: qty 5

## 2016-07-24 MED ORDER — HYDROMORPHONE HCL 1 MG/ML IJ SOLN: 1.0000 mg | INTRAMUSCULAR | Status: DC | PRN

## 2016-07-24 MED ORDER — ACETAMINOPHEN 500 MG PO TABS
1000.0000 mg | ORAL_TABLET | Freq: Four times a day (QID) | ORAL | Status: DC
  Filled 2016-07-24: qty 2

## 2016-07-24 MED ORDER — DIPHENHYDRAMINE HCL 12.5 MG/5ML PO ELIX: 12.5000 mg | ORAL_SOLUTION | Freq: Four times a day (QID) | ORAL | Status: DC | PRN

## 2016-07-24 MED ORDER — MEPERIDINE HCL 25 MG/ML IJ SOLN: 6.2500 mg | INTRAMUSCULAR | Status: DC | PRN

## 2016-07-24 MED ORDER — VANCOMYCIN HCL IN DEXTROSE 1-5 GM/200ML-% IV SOLN
INTRAVENOUS | Status: AC
  Filled 2016-07-24: qty 200

## 2016-07-24 MED ORDER — CELECOXIB 400 MG PO CAPS
400.0000 mg | ORAL_CAPSULE | ORAL | Status: AC
  Administered 2016-07-24: 400 mg via ORAL

## 2016-07-24 SURGICAL SUPPLY — 86 items
ADH SKN CLS APL DERMABOND .7 (GAUZE/BANDAGES/DRESSINGS) ×2
APPLIER CLIP 9.375 MED OPEN (MISCELLANEOUS)
APR CLP MED 9.3 20 MLT OPN (MISCELLANEOUS)
BAG DECANTER FOR FLEXI CONT (MISCELLANEOUS) ×2 IMPLANT
BINDER BREAST LRG (GAUZE/BANDAGES/DRESSINGS) IMPLANT
BINDER BREAST MEDIUM (GAUZE/BANDAGES/DRESSINGS) IMPLANT
BINDER BREAST XLRG (GAUZE/BANDAGES/DRESSINGS) ×1 IMPLANT
BINDER BREAST XXLRG (GAUZE/BANDAGES/DRESSINGS) IMPLANT
BIOPATCH RED 1 DISK 7.0 (GAUZE/BANDAGES/DRESSINGS) ×3 IMPLANT
BLADE HEX COATED 2.75 (ELECTRODE) ×2 IMPLANT
BLADE SURG 10 STRL SS (BLADE) ×3 IMPLANT
BLADE SURG 15 STRL LF DISP TIS (BLADE) ×2 IMPLANT
BLADE SURG 15 STRL SS (BLADE) ×2
BNDG GAUZE ELAST 4 BULKY (GAUZE/BANDAGES/DRESSINGS) ×2 IMPLANT
CANISTER SUCT 1200ML W/VALVE (MISCELLANEOUS) ×4 IMPLANT
CHLORAPREP W/TINT 26ML (MISCELLANEOUS) ×4 IMPLANT
CLIP APPLIE 9.375 MED OPEN (MISCELLANEOUS) ×1 IMPLANT
COVER BACK TABLE 60X90IN (DRAPES) ×4 IMPLANT
COVER MAYO STAND STRL (DRAPES) ×4 IMPLANT
COVER PROBE W GEL 5X96 (DRAPES) ×2 IMPLANT
DECANTER SPIKE VIAL GLASS SM (MISCELLANEOUS) IMPLANT
DERMABOND ADVANCED (GAUZE/BANDAGES/DRESSINGS) ×2
DERMABOND ADVANCED .7 DNX12 (GAUZE/BANDAGES/DRESSINGS) ×1 IMPLANT
DEVICE DISSECT PLASMABLAD 3.0S (MISCELLANEOUS) ×1 IMPLANT
DRAIN CHANNEL 19F RND (DRAIN) ×4 IMPLANT
DRAPE LAPAROSCOPIC ABDOMINAL (DRAPES) ×3 IMPLANT
DRAPE SURG 17X23 STRL (DRAPES) ×2 IMPLANT
DRAPE UTILITY XL STRL (DRAPES) ×2 IMPLANT
DRSG PAD ABDOMINAL 8X10 ST (GAUZE/BANDAGES/DRESSINGS) ×4 IMPLANT
ELECT BLADE 4.0 EZ CLEAN MEGAD (MISCELLANEOUS) ×2
ELECT COATED BLADE 2.86 ST (ELECTRODE) ×2 IMPLANT
ELECT REM PT RETURN 9FT ADLT (ELECTROSURGICAL) ×4
ELECTRODE BLDE 4.0 EZ CLN MEGD (MISCELLANEOUS) ×1 IMPLANT
ELECTRODE REM PT RTRN 9FT ADLT (ELECTROSURGICAL) ×2 IMPLANT
EVACUATOR SILICONE 100CC (DRAIN) ×4 IMPLANT
GAUZE SPONGE 4X4 12PLY STRL LF (GAUZE/BANDAGES/DRESSINGS) ×1 IMPLANT
GLOVE BIO SURGEON STRL SZ 6.5 (GLOVE) ×5 IMPLANT
GLOVE BIO SURGEON STRL SZ7 (GLOVE) ×1 IMPLANT
GLOVE BIO SURGEON STRL SZ7.5 (GLOVE) ×2 IMPLANT
GLOVE BIOGEL PI IND STRL 7.0 (GLOVE) IMPLANT
GLOVE BIOGEL PI INDICATOR 7.0 (GLOVE) ×3
GOWN STRL REUS W/ TWL LRG LVL3 (GOWN DISPOSABLE) ×5 IMPLANT
GOWN STRL REUS W/ TWL XL LVL3 (GOWN DISPOSABLE) IMPLANT
GOWN STRL REUS W/TWL LRG LVL3 (GOWN DISPOSABLE) ×10
GOWN STRL REUS W/TWL XL LVL3 (GOWN DISPOSABLE) ×2
GRAFT FLEX HD 4X16 THICK (Tissue Mesh) ×2 IMPLANT
ILLUMINATOR WAVEGUIDE N/F (MISCELLANEOUS) ×1 IMPLANT
IMPL BREAST 300CC (Breast) IMPLANT
IMPLANT BREAST 300CC (Breast) ×4 IMPLANT
IV NS 1000ML (IV SOLUTION) ×2
IV NS 1000ML BAXH (IV SOLUTION) IMPLANT
IV NS 500ML (IV SOLUTION) ×2
IV NS 500ML BAXH (IV SOLUTION) ×1 IMPLANT
KIT FILL SYSTEM UNIVERSAL (SET/KITS/TRAYS/PACK) ×2 IMPLANT
LIGHT WAVEGUIDE WIDE FLAT (MISCELLANEOUS) IMPLANT
MARKER SKIN DUAL TIP RULER LAB (MISCELLANEOUS) ×1 IMPLANT
NDL HYPO 25X1 1.5 SAFETY (NEEDLE) ×2 IMPLANT
NEEDLE HYPO 25X1 1.5 SAFETY (NEEDLE) ×2 IMPLANT
NS IRRIG 1000ML POUR BTL (IV SOLUTION) ×1 IMPLANT
PACK BASIN DAY SURGERY FS (CUSTOM PROCEDURE TRAY) ×4 IMPLANT
PENCIL BUTTON HOLSTER BLD 10FT (ELECTRODE) ×3 IMPLANT
PIN SAFETY STERILE (MISCELLANEOUS) ×3 IMPLANT
PLASMABLADE 3.0S (MISCELLANEOUS) ×2
SLEEVE SCD COMPRESS KNEE MED (MISCELLANEOUS) ×3 IMPLANT
SPONGE LAP 18X18 X RAY DECT (DISPOSABLE) ×6 IMPLANT
SUT ETHILON 2 0 FS 18 (SUTURE) ×1 IMPLANT
SUT MNCRL AB 4-0 PS2 18 (SUTURE) ×3 IMPLANT
SUT MON AB 3-0 SH 27 (SUTURE) ×4
SUT MON AB 3-0 SH27 (SUTURE) ×1 IMPLANT
SUT MON AB 5-0 PS2 18 (SUTURE) ×3 IMPLANT
SUT PDS 3-0 CT2 (SUTURE)
SUT PDS AB 2-0 CT2 27 (SUTURE) ×4 IMPLANT
SUT PDS II 3-0 CT2 27 ABS (SUTURE) IMPLANT
SUT SILK 2 0 SH (SUTURE) ×2 IMPLANT
SUT SILK 3 0 PS 1 (SUTURE) ×3 IMPLANT
SUT VIC AB 3-0 SH 27 (SUTURE)
SUT VIC AB 3-0 SH 27X BRD (SUTURE) IMPLANT
SUT VICRYL 3-0 CR8 SH (SUTURE) ×1 IMPLANT
SYR BULB IRRIGATION 50ML (SYRINGE) ×2 IMPLANT
SYR CONTROL 10ML LL (SYRINGE) ×3 IMPLANT
TOWEL OR 17X24 6PK STRL BLUE (TOWEL DISPOSABLE) ×6 IMPLANT
TOWEL OR NON WOVEN STRL DISP B (DISPOSABLE) ×2 IMPLANT
TRAY FOLEY CATH SILVER 14FR (SET/KITS/TRAYS/PACK) ×1 IMPLANT
TUBE CONNECTING 20X1/4 (TUBING) ×4 IMPLANT
UNDERPAD 30X30 (UNDERPADS AND DIAPERS) ×4 IMPLANT
YANKAUER SUCT BULB TIP NO VENT (SUCTIONS) ×3 IMPLANT

## 2016-07-24 NOTE — Anesthesia Procedure Notes (Signed)
Procedure Name: Intubation Date/Time: 07/24/2016 9:39 AM Performed by: Genelle Bal Pre-anesthesia Checklist: Patient identified, Emergency Drugs available, Suction available and Patient being monitored Patient Re-evaluated:Patient Re-evaluated prior to inductionOxygen Delivery Method: Circle system utilized Preoxygenation: Pre-oxygenation with 100% oxygen Intubation Type: IV induction Ventilation: Mask ventilation without difficulty Laryngoscope Size: Miller and 2 Grade View: Grade I Tube type: Oral Number of attempts: 1 Airway Equipment and Method: Stylet and Oral airway Placement Confirmation: ETT inserted through vocal cords under direct vision,  positive ETCO2 and breath sounds checked- equal and bilateral Secured at: 22 cm Tube secured with: Tape Dental Injury: Teeth and Oropharynx as per pre-operative assessment

## 2016-07-24 NOTE — Progress Notes (Signed)
Nuc med injection performed by nuc med staff. Pt tol well with no additional sedation. VSS. Will call family to bedside and update/provide emotional support

## 2016-07-24 NOTE — Anesthesia Procedure Notes (Signed)
Anesthesia Regional Block: Pectoralis block   Pre-Anesthetic Checklist: ,, timeout performed, Correct Patient, Correct Site, Correct Laterality, Correct Procedure, Correct Position, site marked, Risks and benefits discussed, pre-op evaluation,  At surgeon's request and post-op pain management  Laterality: Left  Prep: chloraprep       Needles:   Needle Type: Echogenic Needle     Needle Length: 9cm  Needle Gauge: 21     Additional Needles:   Procedures: ultrasound guided,,,,,,,,  Narrative:  Start time: 07/24/2016 9:09 AM End time: 07/24/2016 9:09 AM Injection made incrementally with aspirations every 5 mL. Anesthesiologist: Lyndle Herrlich

## 2016-07-24 NOTE — Op Note (Signed)
Op report    DATE OF OPERATION:  07/24/2016  LOCATION: [facility]  SURGICAL DIVISION: Plastic Surgery  PREOPERATIVE DIAGNOSES:  1. Bilateral Breast cancer.    POSTOPERATIVE DIAGNOSES:  1. Bilateral Breast cancer.   PROCEDURE:  1. Bilateral immediate breast reconstruction with placement of Acellular Dermal Matrix and tissue expanders.  SURGEON: Claire Sanger Dillingham, DO  ASSISTANT: [name], PA  ANESTHESIA:  General.   COMPLICATIONS: None.   IMPLANTS: Left - Mentor High Profile Artoura smooth 300cc. Ref #[id number].  Serial Number [id number] =  250 cc of saline placed Right - Mentor High Profile Artoura smooth 300cc. Ref #[id number]. Serial Number [id number]= 250 cc of saline placed Acellular Dermal Matrix 4 x 16 cm two  INDICATIONS FOR PROCEDURE:  The patient, [name] [name], is a 51 y.o. female born on 1965-12-30, is here for  immediate first stage breast reconstruction with placement of bilateral* tissue expander and Acellular dermal matrix. MRN: [id number]  CONSENT:  Informed consent was obtained directly from the patient. Risks, benefits and alternatives were fully discussed. Specific risks including but not limited to bleeding, infection, hematoma, seroma, scarring, pain, implant infection, implant extrusion, capsular contracture, asymmetry, wound healing problems, and need for further surgery were all discussed. The patient did have an ample opportunity to have her questions answered to her satisfaction.   DESCRIPTION OF PROCEDURE:  The patient was taken to the operating room by the general surgery team. SCDs were placed and IV antibiotics were given. The patient's chest was prepped and draped in a sterile fashion. A time out was performed and the implants to be used were identified.  Bilateral mastectomies were performed.  Once the general surgery team had completed their portion of the case the patient was rendered to the plastic and  reconstructive surgery team.  The same procedure was done on both sides.  The pectoralis major muscle was lifted from the chest Mitton with release of the lateral edge and lateral inframammary fold.  The pocket was irrigated with antibiotic solution and hemostasis was achieved with electrocautery.  The ADM was then prepared according to the manufacture guidelines and slits placed to help with postoperative fluid management.  The ADM was then sutured to the inferior and lateral edge of the inframammary fold with 2-0 PDS starting with an interrupted stitch and then a running stitch.  The lateral portion was sutured to with interrupted sutures after the expander was placed.  The expander was prepared according to the manufacture guidelines, the air evacuated and then it was placed under the ADM and pectoralis major muscle.  The inferior and lateral tabs were used to secure the expander to the chest Eugene with 2-0 PDS.  The drain was placed at the inframammary fold over the ADM and secured to the skin with 3-0 Silk.    The deep layers were closed with 3-0 Monocryl followed by 4-0 Monocryl.  The skin was closed with 5-0 Monocryl and then dermabond was applied.  The ABDs and breast binder were placed.  The patient tolerated the procedure well and there were no complications.  The patient was allowed to wake from anesthesia and taken to the recovery room in satisfactory condition.

## 2016-07-24 NOTE — Interval H&P Note (Signed)
History and Physical Interval Note:  07/24/2016 11:04 AM  [name] [name]  has presented today for surgery, with the diagnosis of LEFT BREAST ILC AND LCIS, HISTORY OF RIGHT BREAST CANCER  The various methods of treatment have been discussed with the patient and family. After consideration of risks, benefits and other options for treatment, the patient has consented to  Procedure(s): LEFT MASTECTOMY WITH LEFT SENTINEL LYMPH NODE BIOPSY, RIGHT PROPHYLACTIC MASTECTOMY (Bilateral) IMMEDIATE BREAST RECONSTRUCTION WITH PLACEMENT OF TISSUE EXPANDER AND FLEX HD (ACELLULAR HYDRATED DERMIS) (Bilateral) as a surgical intervention .  The patient's history has been reviewed, patient examined, no change in status, stable for surgery.  I have reviewed the patient's chart and labs.  Questions were answered to the patient's satisfaction.     Wallace Going

## 2016-07-24 NOTE — Interval H&P Note (Signed)
History and Physical Interval Note:  07/24/2016 8:44 AM  [name] [name]  has presented today for surgery, with the diagnosis of LEFT BREAST ILC AND LCIS, HISTORY OF RIGHT BREAST CANCER  The various methods of treatment have been discussed with the patient and family. After consideration of risks, benefits and other options for treatment, the patient has consented to  Procedure(s): LEFT MASTECTOMY WITH LEFT SENTINEL LYMPH NODE BIOPSY, RIGHT PROPHYLACTIC MASTECTOMY (Bilateral) IMMEDIATE BREAST RECONSTRUCTION WITH PLACEMENT OF TISSUE EXPANDER AND FLEX HD (ACELLULAR HYDRATED DERMIS) (Bilateral) as a surgical intervention .  The patient's history has been reviewed, patient examined, no change in status, stable for surgery.  I have reviewed the patient's chart and labs.  Questions were answered to the patient's satisfaction.     TOTH III,Shaquela Weichert S

## 2016-07-24 NOTE — Op Note (Signed)
07/24/2016  11:45 AM  PATIENT:  [name] [name]  51 y.o. female  PRE-OPERATIVE DIAGNOSIS:  LEFT BREAST ILC AND LCIS, HISTORY OF RIGHT BREAST CANCER  POST-OPERATIVE DIAGNOSIS: LEFT BREAST INVASIVE LOBULAR CANCER AND LCIS, HISTORY OF RIGHT BREAST CANCER  PROCEDURE:  Procedure(s): LEFT MASTECTOMY WITH LEFT DEEP AXILLARY SENTINEL LYMPH NODE BIOPSY, RIGHT PROPHYLACTIC MASTECTOMY (Bilateral) IMMEDIATE BREAST RECONSTRUCTION WITH PLACEMENT OF TISSUE EXPANDER AND FLEX HD (ACELLULAR HYDRATED DERMIS) (Bilateral)  SURGEON:  Surgeon(s) and Role: Panel 1:    * Jovita Kussmaul, MD - Primary  Panel 2:    * Dillingham, Loel Lofty, DO - Primary  PHYSICIAN ASSISTANT:   ASSISTANTS: none   ANESTHESIA:   general  EBL:  Total I/O In: 1000 [I.V.:1000] Out: -   BLOOD ADMINISTERED:none  DRAINS: none   LOCAL MEDICATIONS USED:  NONE  SPECIMEN:  Source of Specimen:  right mastectomy and left mastectomy with sentinel nodes X 2  DISPOSITION OF SPECIMEN:  PATHOLOGY  COUNTS:  YES  TOURNIQUET:  * No tourniquets in log *  DICTATION: .Dragon Dictation   After informed consent was obtained the patient was brought to the operating room and placed in the supine position on the operating room table. After adequate induction of general anesthesia the patient's bilateral chest, breast, and axillary areas were prepped with ChloraPrep, allowed to dry, and draped in usual sterile manner. Earlier in the day the patient underwent injection of 1 mCi of technetium sulfur colloid in the subareolar position on the left. An appropriate timeout was performed. Elliptical incisions were then mapped out around the nipple and areola complex on both sides. Attention was first turned to the right side. The incision was made along the mapped out area with a 10 blade knife. The incision was carried through the skin and subcutaneous tissue sharply with the plasma blade. Rest looks were then used to elevate the skin flaps anteriorly towards  the ceiling. Thin skin flaps were created circumferentially between the breast tissue and subcutaneous fat. This dissection was carried all the way to the chest Hochberg. Next the breast was removed from the pectoralis muscle with the pectoralis fascia. Once the breast was removed it was oriented with a stitch on the lateral skin. The breast was then sent to pathology for further evaluation. Hemostasis was achieved using the plasma blade. The wound was then irrigated with copious amounts of sterile water. A couple of intercostal brachial nerves were controlled laterally with clips. The wound was then packed with a moistened lap sponge and covered with a sterile blue towel. Attention was then turned to the left breast. A similar elliptical incision was made with a 10 blade knife. The incision was carried through the skin and subcutaneous tissue sharply with the plasma blade. Breast hooks were used to elevate the skin flaps anteriorly towards the ceiling. Thin skin flaps were then created circumferentially dissecting between the breast tissue and the subcutaneous fat. This dissection was carried all the way to the chest Granlund. Next the breast was removed from the pectoralis muscle with the pectoralis fascia. A couple of intercostal brachial nerves laterally were controlled with clips. The breast was then marked with a stitch on the lateral skin and once removed sent to pathology for further evaluation. The neoprobe was then used to direct blunt hemostatic dissection into the left deep axillary space. 2 hot lymph nodes were identified. Each of these lymph nodes were excised sharply with the plasma blade and the lymphatics were controlled with clips. Ex vivo  counts on sentinel node #1 was 4000 and ex vivo counts on sentinel node #2 were approximately 200. No other hot or palpable lymph nodes were identified in the left axilla. The wound was then irrigated with copious amounts of sterile water and hemostasis was achieved  using the plasma blade. The wound was then packed with a moistened lap sponge. At this point the patient had tolerated the procedure well. All needle sponge and isthmic counts were correct. The operation was then turned over to Dr. Marla Roe for the reconstruction. Her portion will be dictated separately.  PLAN OF CARE: Admit for overnight observation  PATIENT DISPOSITION:  PACU - hemodynamically stable.   Delay start of Pharmacological VTE agent (>24hrs) due to surgical blood loss or risk of bleeding: no

## 2016-07-24 NOTE — Progress Notes (Signed)
Assisted Dr. Lyndle Herrlich with right and left, ultrasound guided, pectoralis blocks. Side rails up, monitors on throughout procedure. See vital signs in flow sheet. Tolerated Procedure well.

## 2016-07-24 NOTE — Anesthesia Procedure Notes (Signed)
Anesthesia Regional Block: Pectoralis block   Pre-Anesthetic Checklist: ,, timeout performed, Correct Patient, Correct Site, Correct Laterality, Correct Procedure, Correct Position, site marked, Risks and benefits discussed, pre-op evaluation,  At surgeon's request and post-op pain management  Laterality: Right  Prep: chloraprep       Needles:   Needle Type: Echogenic Needle     Needle Length: 9cm  Needle Gauge: 21     Additional Needles:   Procedures: ultrasound guided,,,,,,,,  Narrative:  Start time: 07/24/2016 8:51 AM End time: 07/24/2016 8:57 AM Injection made incrementally with aspirations every 5 mL. Anesthesiologist: Lyndle Herrlich  Additional Notes: 20cc .25% Marcaine/epi

## 2016-07-24 NOTE — H&P (Signed)
[name] [name]  Location: [facility] Patient #: [id number] DOB: Sep 30, 1965 Married / Language: English / Race: Black or African American Female   History of Present Illness  The patient is a 51 year old female who presents for a Follow-up for Breast cancer. The patient is a 51 year old black female who is about 2 years status post right lumpectomy and sentinel node mapping for a T1 cN0 right breast cancer. She was ER/PR positive and HER-2 negative with a Ki-67 of 60%. She was treated with radiation therapy and antiestrogen therapy. She recently underwent follow-up mammogram followed by MRI which revealed a 6.9 cm area of enhancement centrally in the left breast. 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 The cancer was ER positive and PR negative and HER-2 negative with a Ki-67 of 20%. She has been on tamoxifen for the last 2 years. She denies any breast pain or discharge from the nipple. She is very concerned that her current cancer developed on the antiestrogen therapy.   Allergies Tetracycline *CHEMICALS*  Penicillin G Pot in Dextrose *PENICILLINS*   Medication History  Benzonatate (100MG Capsule, Oral) Active. Glimepiride (4MG Tablet, Oral) Active. Atorvastatin Calcium (10MG Tablet, Oral) Active. LevoFLOXacin (750MG Tablet, Oral) Active. Tamoxifen Citrate (20MG Tablet, Oral) Active. MetFORMIN HCl (500MG Tablet, Oral every 4 to 6 hours as needed for pain) Active. Medications Reconciled    Review of Systems  General Not Present- Appetite Loss, Chills, Fatigue, Fever, Night Sweats, Weight Gain and Weight Loss. Skin Not Present- Change in Wart/Mole, Dryness, Hives, Jaundice, New Lesions, Non-Healing Wounds, Rash and Ulcer. HEENT Present- Wears glasses/contact lenses. Not Present- Earache, Hearing Loss, Hoarseness, Nose Bleed, Oral Ulcers, Ringing in the Ears, Seasonal Allergies, Sinus Pain, Sore Throat, Visual  Disturbances and Yellow Eyes. Respiratory Not Present- Bloody sputum, Chronic Cough, Difficulty Breathing, Snoring and Wheezing. Breast Not Present- Breast Mass, Breast Pain, Nipple Discharge and Skin Changes. Cardiovascular Not Present- Chest Pain, Difficulty Breathing Lying Down, Leg Cramps, Palpitations, Rapid Heart Rate, Shortness of Breath and Swelling of Extremities. Gastrointestinal Not Present- Abdominal Pain, Bloating, Bloody Stool, Change in Bowel Habits, Chronic diarrhea, Constipation, Difficulty Swallowing, Excessive gas, Gets full quickly at meals, Hemorrhoids, Indigestion, Nausea, Rectal Pain and Vomiting. Female Genitourinary Not Present- Frequency, Nocturia, Painful Urination, Pelvic Pain and Urgency. Musculoskeletal Not Present- Back Pain, Joint Pain, Joint Stiffness, Muscle Pain, Muscle Weakness and Swelling of Extremities. Neurological Not Present- Decreased Memory, Fainting, Headaches, Numbness, Seizures, Tingling, Tremor, Trouble walking and Weakness. Psychiatric Not Present- Anxiety, Bipolar, Change in Sleep Pattern, Depression, Fearful and Frequent crying. Endocrine Present- New Diabetes. Not Present- Cold Intolerance, Excessive Hunger, Hair Changes, Heat Intolerance and Hot flashes. Hematology Not Present- Easy Bruising, Excessive bleeding, Gland problems, HIV and Persistent Infections.  Vitals Weight: 244.4 lb Height: 67.5in Body Surface Area: 2.21 m Body Mass Index: 37.71 kg/m  Temp.: 97.28F  Pulse: 87 (Regular)  BP: 136/90 (Sitting, Left Arm, Standard)       Physical Exam  General Mental Status-Alert. General Appearance-Consistent with stated age. Hydration-Well hydrated. Voice-Normal.  Head and Neck Head-normocephalic, atraumatic with no lesions or palpable masses. Trachea-midline. Thyroid Gland Characteristics - normal size and consistency.  Eye Eyeball - Bilateral-Extraocular movements intact. Sclera/Conjunctiva -  Bilateral-No scleral icterus.  Chest and Lung Exam Chest and lung exam reveals -quiet, even and easy respiratory effort with no use of accessory muscles and on auscultation, normal breath sounds, no adventitious sounds and normal vocal resonance. Inspection  Chest [place] - Normal. Back - normal.  Breast Note: The right breast incision has healed nicely. There is no sign of infection or seroma. There is still significant thickening of the breast tissue and skin secondary to radiation. There is no palpable mass of the left breast. There is no palpable axillary, supraclavicular, or cervical lymphadenopathy   Cardiovascular Cardiovascular examination reveals -normal heart sounds, regular rate and rhythm with no murmurs and normal pedal pulses bilaterally.  Abdomen Inspection Inspection of the abdomen reveals - No Hernias. Skin - Scar - no surgical scars. Palpation/Percussion Palpation and Percussion of the abdomen reveal - Soft, Non Tender, No Rebound tenderness, No Rigidity (guarding) and No hepatosplenomegaly. Auscultation Auscultation of the abdomen reveals - Bowel sounds normal.  Neurologic Neurologic evaluation reveals -alert and oriented x 3 with no impairment of recent or remote memory. Mental Status-Normal.  Musculoskeletal Normal Exam - Left-Upper Extremity Strength Normal and Lower Extremity Strength Normal. Normal Exam - Right-Upper Extremity Strength Normal and Lower Extremity Strength Normal.  Lymphatic Head & Neck  General Head & Neck Lymphatics: Bilateral - Description - Normal. Axillary  General Axillary Region: Bilateral - Description - Normal. Tenderness - Non Tender. Femoral & Inguinal  Generalized Femoral & Inguinal Lymphatics: Bilateral - Description - Normal. Tenderness - Non Tender.    Assessment & Plan  LOBULAR BREAST CANCER, RIGHT (C50.911) PRIMARY CANCER OF LEFT FEMALE BREAST (C50.912) Impression: The patient is about 2 years status  post right lumpectomy for breast cancer. She recently underwent an MRI which revealed a 6.9 cm area of enhancement in the central left breast which was biopsied and came back as LCIS and invasive lobular cancer. I have talked to her in detail about the different options for treatment and at this point she favors bilateral mastectomies with a left sentinel node mapping and reconstruction. Given her history of previous right-sided cancer in the LCIS I think her risk for recurrent cancer on both sides is high enough(>20%) to warrant bilateral mastectomies. I have discussed with her in detail the risks and benefits of the operation as well as some of the technical aspects and she understands and wishes to proceed. I will refer her to plastic surgery and then coordinate with them for surgery. Current Plans Referred to Surgery - Plastic, for evaluation and follow up (Plastic Surgery). Routine.

## 2016-07-24 NOTE — H&P (View-Only) (Signed)
[name] [name] is an 51 y.o. female.   Chief Complaint: Breast cancer HPI: The patient is a 51 yrs old bf here for evaluation for breast reconstruction.  She was diagnosed with RIGHT upper-outer quadrant breast cancer, ER/PR positive, Her 2 negative.  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  She underwent right breast lumpectomy with radiation (08/2014) and tamoxifen therapy.  The Tamoxifen was stopped 4/26 and switched to anastrozole daily.   She had a mammogram which was negative but MRI revealed a 6.9 cm area of enhancement with a biopsy proven invasive lobular cancer.  She is now interested in bilateral mastectomies.  She is 5 feet 7 inches tall, weight is 244 pounds.  Preop bra= 38C.  The sternal notch to NAD on the right is 28 cm and 30 cm on the left.  She is planning on having bilateral skin sparing mastectomies and not trying to save the NAC.   Past Medical History:  Diagnosis Date  . Breast cancer ([place])   . Diabetes mellitus without complication ([place])   . Radiation 07/24/14-09/08/14   Right upper breast    Past Surgical History:  Procedure Laterality Date  . AXILLARY SENTINEL NODE BIOPSY Right 06/23/2014   Procedure: AXILLARY SENTINEL NODE BIOPSY;  Surgeon: Autumn Messing III, MD;  Location: [facility];  Service: General;  Laterality: Right;  . BREAST LUMPECTOMY WITH NEEDLE LOCALIZATION Right 06/05/2014   Procedure: RIGHT BREAST LUMPECTOMY WITH NEEDLE LOCALIZATION;  Surgeon: Autumn Messing III, MD;  Location: [place];  Service: General;  Laterality: Right;  . RE-EXCISION OF BREAST LUMPECTOMY Right 06/23/2014   Procedure: RE-EXCISION OF RIGHT BREAST INFERIOR MEDIAL MARGIN AND SENTINEL LYMPH NODE BIOPSY;  Surgeon: Autumn Messing III, MD;  Location: [facility];  Service: General;  Laterality: Right;    Family History  Problem Relation Age of Onset  . Colon cancer Mother        dx. late 72s  .  Congestive Heart Failure Father        smoker  . Cancer Maternal Aunt        unspecified type; dx. 3s  . Heart attack Maternal Uncle   . Breast cancer Paternal Aunt        dx. 49s  . Heart attack Paternal Uncle   . Heart attack Paternal Grandmother   . Heart attack Paternal Grandfather   . Congestive Heart Failure Paternal Aunt   . Cancer Cousin        unspecified type/unspecified type  . Breast cancer Cousin 56   Social History:  reports that she has never smoked. She has never used smokeless tobacco. She reports that she does not drink alcohol or use drugs.  Allergies:  Allergies  Allergen Reactions  . Food     shellfish  . Penicillins   . Tetracyclines & Related      (Not in a hospital admission)  No results found for this or any previous visit (from the past 48 hour(s)). No results found.  Review of Systems  Constitutional: Negative.   HENT: Negative.   Eyes: Negative.   Respiratory: Negative.   Cardiovascular: Negative.   Gastrointestinal: Negative.   Genitourinary: Negative.   Musculoskeletal: Negative.   Skin: Negative.   Neurological: Negative.   Psychiatric/Behavioral: Negative.     There were no vitals taken for this visit. Physical Exam  Constitutional: She is oriented to person, place, and time. She appears  well-developed and well-nourished.  HENT:  Head: Normocephalic and atraumatic.  Eyes: EOM are normal. Pupils are equal, round, and reactive to light.  Cardiovascular: Normal rate.   Respiratory: Effort normal.  GI: She exhibits no distension.  Musculoskeletal: She exhibits no edema.  Neurological: She is alert and oriented to person, place, and time.  Skin: Skin is warm. No rash noted. No erythema. No pallor.  Psychiatric: She has a normal mood and affect. Her behavior is normal. Judgment and thought content normal.     Assessment/Plan A general discussion regarding all available methods of breast reconstruction were discussed. The types  of reconstructions described included.  1. Tissue expander and implant based reconstruction, both single and multi-stage approaches.  2. Autologous only reconstructions, including free abdominal-tissue based reconstructions.  3. Combination procedures, particularly latissismus dorsi flaps combined with either expanders or implants.  For each of the reconstruction methods mentioned above, the risks, benefits, alternatives, scarring, and recovery time were discussed in great detail. Specific risks detailed included bleeding, infection, hematoma, seroma, scarring, pain, wound healing complications, flap loss, fat necrosis, capsular contracture, need for implant removal, donor site complications, bulge, hernia, umbilical necrosis, need for urgent reoperation, and need for dressing changes were discussed.   Assessment  Once all reconstruction options were presented, a focused discussion was had regarding the patient's suitability for each of these procedures.  A total of 50 minutes of face-to-face time was spent in this encounter, of which >50% was spent in counseling.  The patient was given time to look through the picture book.  She is at risk of the right breast not stretching with expansion due to the radiation.    I spoke with the patient on the phone and she has decided to move ahead with the immediate reconstruction bilateral breasts with expander and FlexHD.  Wallace Going, DO 07/15/2016, 2:55 PM

## 2016-07-24 NOTE — Transfer of Care (Signed)
Immediate Anesthesia Transfer of Care Note  Patient: [name] [name]  Procedure(s) Performed: Procedure(s): LEFT MASTECTOMY WITH LEFT SENTINEL LYMPH NODE BIOPSY, RIGHT PROPHYLACTIC MASTECTOMY (Bilateral) IMMEDIATE BREAST RECONSTRUCTION WITH PLACEMENT OF TISSUE EXPANDER AND FLEX HD (ACELLULAR HYDRATED DERMIS) (Bilateral)  Patient Location: PACU  Anesthesia Type:General  Level of Consciousness: drowsy and patient cooperative  Airway & Oxygen Therapy: Patient Spontanous Breathing and Patient connected to face mask oxygen  Post-op Assessment: Report given to RN and Post -op Vital signs reviewed and stable  Post vital signs: Reviewed and stable  Last Vitals:  Vitals:   07/24/16 0910 07/24/16 1310  BP:  (!) 186/103  Pulse: 99   Resp: 15 18  Temp:      Last Pain:  Vitals:   07/24/16 0809  TempSrc: Oral         Complications: No apparent anesthesia complications

## 2016-07-24 NOTE — Anesthesia Preprocedure Evaluation (Addendum)
Anesthesia Evaluation  Patient identified by MRN, date of birth, ID band Patient awake    Reviewed: Allergy & Precautions, H&P , NPO status , Patient's Chart, lab work & pertinent test results, reviewed documented beta blocker date and time   Airway Mallampati: II  TM Distance: >3 FB Neck ROM: Full    Dental no notable dental hx.    Pulmonary neg pulmonary ROS,    Pulmonary exam normal breath sounds clear to auscultation       Cardiovascular negative cardio ROS Normal cardiovascular exam Rhythm:Regular Rate:Normal     Neuro/Psych negative neurological ROS  negative psych ROS   GI/Hepatic negative GI ROS, Neg liver ROS,   Endo/Other  diabetes, Type 2Morbid obesity  Renal/GU negative Renal ROS  negative genitourinary   Musculoskeletal negative musculoskeletal ROS (+)   Abdominal   Peds negative pediatric ROS (+)  Hematology negative hematology ROS (+)   Anesthesia Other Findings   Reproductive/Obstetrics negative OB ROS                             Anesthesia Physical  Anesthesia Plan  ASA: III  Anesthesia Plan: General   Post-op Pain Management:  Regional for Post-op pain   Induction: Intravenous  PONV Risk Score and Plan: 2 and Ondansetron, Dexamethasone and Propofol  Airway Management Planned: Oral ETT  Additional Equipment:   Intra-op Plan:   Post-operative Plan: Extubation in OR  Informed Consent: I have reviewed the patients History and Physical, chart, labs and discussed the procedure including the risks, benefits and alternatives for the proposed anesthesia with the patient or authorized representative who has indicated his/her understanding and acceptance.   Dental advisory given  Plan Discussed with: CRNA and Surgeon  Anesthesia Plan Comments: (  )       Anesthesia Quick Evaluation

## 2016-07-25 ENCOUNTER — Encounter (HOSPITAL_BASED_OUTPATIENT_CLINIC_OR_DEPARTMENT_OTHER): Payer: Self-pay | Admitting: General Surgery

## 2016-07-25 DIAGNOSIS — C50912 Malignant neoplasm of unspecified site of left female breast: Secondary | ICD-10-CM | POA: Diagnosis not present

## 2016-07-25 MED ORDER — SODIUM CHLORIDE 0.9 % IV SOLN: 250.0000 mL | INTRAVENOUS | Status: DC | PRN

## 2016-07-25 MED ORDER — ACETAMINOPHEN 325 MG PO TABS: 650.0000 mg | ORAL_TABLET | ORAL | Status: DC | PRN

## 2016-07-25 MED ORDER — SODIUM CHLORIDE 0.9% FLUSH: 3.0000 mL | Freq: Two times a day (BID) | INTRAVENOUS | Status: DC

## 2016-07-25 MED ORDER — ACETAMINOPHEN 650 MG RE SUPP: 650.0000 mg | RECTAL | Status: DC | PRN

## 2016-07-25 MED ORDER — OXYCODONE HCL 5 MG PO TABS: 5.0000 mg | ORAL_TABLET | ORAL | Status: DC | PRN

## 2016-07-25 MED ORDER — SODIUM CHLORIDE 0.9% FLUSH: 3.0000 mL | INTRAVENOUS | Status: DC | PRN

## 2016-07-25 NOTE — Discharge Instructions (Addendum)
Drain Care No heavy lifting Continue binder or sports bra  Call your surgeon if you experience:   1.  Fever over 101.0. 2.  Inability to urinate. 3.  Nausea and/or vomiting. 4.  Extreme swelling or bruising at the surgical site. 5.  Continued bleeding from the incision. 6.  Increased pain, redness or drainage from the incision. 7.  Problems related to your pain medication. 8.  Any problems and/or concerns  About my Jackson-Pratt Bulb Drain  What is a Jackson-Pratt bulb? A Jackson-Pratt is a soft, round device used to collect drainage. It is connected to a long, thin drainage catheter, which is held in place by one or two small stiches near your surgical incision site. When the bulb is squeezed, it forms a vacuum, forcing the drainage to empty into the bulb.  Emptying the Jackson-Pratt bulb- To empty the bulb: 1. Release the plug on the top of the bulb. 2. Pour the bulb's contents into a measuring container which your nurse will provide. 3. Record the time emptied and amount of drainage. Empty the drain(s) as often as your     doctor or nurse recommends.  Date                  Time                    Amount (Drain 1)                 Amount (Drain 2)  _____________________________________________________________________  _____________________________________________________________________  _____________________________________________________________________  _____________________________________________________________________  _____________________________________________________________________  _____________________________________________________________________  _____________________________________________________________________  _____________________________________________________________________  Squeezing the Jackson-Pratt Bulb- To squeeze the bulb: 1. Make sure the plug at the top of the bulb is open. 2. Squeeze the bulb tightly in your fist. You will hear  air squeezing from the bulb. 3. Replace the plug while the bulb is squeezed. 4. Use a safety pin to attach the bulb to your clothing. This will keep the catheter from     pulling at the bulb insertion site.  When to call your doctor- Call your doctor if:  Drain site becomes red, swollen or hot.  You have a fever greater than 101 degrees F.  There is oozing at the drain site.  Drain falls out (apply a guaze bandage over the drain hole and secure it with tape).  Drainage increases daily not related to activity patterns. (You will usually have more drainage when you are active than when you are resting.)  Drainage has a bad odor.  SACRAL DRESSING (Lower Back)   A pressure ulcer is a sore where the skin breaks open   This dressing will be placed on your lower back to protect this area from pressure and moisture and in many cases helps prevent pressure ulcers from forming   A nurse may place this dressing before your surgery or another procedure   A nurse may also place this dressing if you have other conditions that put you at risk for developing a pressure ulcer   If you are getting up and moving around after surgery, the dressing may be taken off with your first shower. Simply remove it and throw it away.   While you are in the hospital, nurses will change the dressing twice a week as long as you are still at risk for developing a pressure ulcer   This dressing is latex free and made with silicone (for adhesive sensitivity) so it is safe and gentle to the skin

## 2016-07-25 NOTE — Anesthesia Postprocedure Evaluation (Signed)
Anesthesia Post Note  Patient: [name] [name]  Procedure(s) Performed: Procedure(s) (LRB): LEFT MASTECTOMY WITH LEFT SENTINEL LYMPH NODE BIOPSY, RIGHT PROPHYLACTIC MASTECTOMY (Bilateral) IMMEDIATE BREAST RECONSTRUCTION WITH PLACEMENT OF TISSUE EXPANDER AND FLEX HD (ACELLULAR HYDRATED DERMIS) (Bilateral)     Patient location during evaluation: PACU Anesthesia Type: General Level of consciousness: awake and alert Pain management: pain level controlled Vital Signs Assessment: post-procedure vital signs reviewed and stable Respiratory status: spontaneous breathing, nonlabored ventilation, respiratory function stable and patient connected to nasal cannula oxygen Cardiovascular status: blood pressure returned to baseline and stable Postop Assessment: no signs of nausea or vomiting Anesthetic complications: no    Last Vitals:  Vitals:   07/25/16 0900 07/25/16 0942  BP:  (!) 163/72  Pulse: 83 85  Resp:  14  Temp: 36.3 C     Last Pain:  Vitals:   07/25/16 0942  TempSrc:   PainSc: 6                  Isak Sotomayor EDWARD

## 2016-07-31 ENCOUNTER — Encounter: Payer: Self-pay | Admitting: *Deleted

## 2016-07-31 DIAGNOSIS — Z170 Estrogen receptor positive status [ER+]: Secondary | ICD-10-CM

## 2016-07-31 DIAGNOSIS — C50212 Malignant neoplasm of upper-inner quadrant of left female breast: Secondary | ICD-10-CM | POA: Insufficient documentation

## 2016-08-04 ENCOUNTER — Encounter: Payer: Self-pay | Admitting: Hematology and Oncology

## 2016-08-04 ENCOUNTER — Telehealth: Payer: Self-pay | Admitting: *Deleted

## 2016-08-04 ENCOUNTER — Ambulatory Visit (HOSPITAL_BASED_OUTPATIENT_CLINIC_OR_DEPARTMENT_OTHER): Payer: 59

## 2016-08-04 ENCOUNTER — Ambulatory Visit (HOSPITAL_BASED_OUTPATIENT_CLINIC_OR_DEPARTMENT_OTHER): Payer: 59 | Admitting: Hematology and Oncology

## 2016-08-04 DIAGNOSIS — Z853 Personal history of malignant neoplasm of breast: Secondary | ICD-10-CM | POA: Diagnosis not present

## 2016-08-04 DIAGNOSIS — C50419 Malignant neoplasm of upper-outer quadrant of unspecified female breast: Secondary | ICD-10-CM | POA: Diagnosis not present

## 2016-08-04 DIAGNOSIS — C50912 Malignant neoplasm of unspecified site of left female breast: Secondary | ICD-10-CM | POA: Diagnosis not present

## 2016-08-04 DIAGNOSIS — C50411 Malignant neoplasm of upper-outer quadrant of right female breast: Secondary | ICD-10-CM

## 2016-08-04 DIAGNOSIS — Z170 Estrogen receptor positive status [ER+]: Secondary | ICD-10-CM

## 2016-08-04 NOTE — Progress Notes (Signed)
Patient Care Team: College, [facility] [place] as PCP - General (Family Medicine) Jovita Kussmaul, MD as Consulting Physician (General Surgery) Nicholas Lose, MD as Consulting Physician (Hematology and Oncology) Gery Pray, MD as Consulting Physician (Radiation Oncology) Rockwell Germany, RN as Registered Nurse Mauro Kaufmann, RN as Registered Nurse Holley Bouche, NP as Nurse Practitioner (Nurse Practitioner) Sylvan Cheese, NP as Nurse Practitioner (Nurse Practitioner)  DIAGNOSIS:  Encounter Diagnosis  Name Primary?  . Malignant neoplasm of upper-outer quadrant of right breast in female, estrogen receptor positive ([place])     SUMMARY OF ONCOLOGIC HISTORY:   Breast cancer of upper-outer quadrant of right female breast ([place])   04/07/2014 Mammogram    Right breast: calcifications       04/14/2014 Initial Biopsy    Right breast needle biopsy: Mammary carcinoma in situ with calcifications at 9 to 10:00 position ER+ (95%), PR+ (66%), DCIS plus LCIS, heterogeneous somewhat E Cadherin positive some negative      04/26/2014 Breast MRI    Biopsy-proven DCIS is identified in the outer quadrant; ~ 3 cm AP diameter linear clumped NME extends from the medial biopsy cavity posteriorly in the central breast which is suspicious for disease extension      04/26/2014 Clinical Stage    Stage 0: Tis Nx      06/05/2014 Definitive Surgery    Right lumpectomy [name]): ILC, grade 2/3, 0.6 cm, LCIS invasive ca focally present at inferior margin (rt Medial); ILC, grade 2-3, 1.7 cm with neg margins with LCIS (Rt Lateral) ER 100%, PR 99%, Ki 67 6%, HER2/neu neg (ratio 1.14)      06/05/2014 Oncotype testing    Score: 8 (ROR 6%)      06/05/2014 Pathologic Stage    Stage IA: mpT1c pNx      06/23/2014 Surgery    Right breast excision: Benign, 0/3 lymph nodes negative      07/24/2014 - 09/08/2014 Radiation Therapy    Adjuvant RT completed [name]): Right breast 45 Gy over  25 fractions.  Right breast boost 16 Gy over 8 fractions.  Total dose: 60 Gy.        09/25/2014 -  Anti-estrogen oral therapy    Tamoxifen 20 mg daily (Reisa Coppola).  Planned duration of therapy 5-10 years.      11/02/2014 Procedure    Breast/Ovarian panel revealed no clinically significant variant at ATM, BARD1, BRCA1, BRCA2, BRIP1, CDH1, CHEK2, FANCC, MLH1, MSH2, MSH6, NBN, PALB2, PMS2, PTEN, RAD51C, RAD51D, TP53, and XRCC2      11/03/2014 Survivorship    Survivorship visit completed and copy of survivorship care plan provided to patient.      05/07/2016 Breast MRI    Left breast non-mass enhancement spanning 6.9 cm. Small enhancing mass 5.6 cm, no lymph nodes. Right breast normal      05/20/2016 Pathology Results    Left breast biopsy posterior outer: Invasive lobular cancer with LCIS, ER 95%, PR 0%, Ki-67 20%, HER-2 negative ratio 1.25      07/24/2016 Surgery    Left mastectomy: ILC grade 2, 1.4 cm, margins negative, 0/2 lymph nodes negative, T1 CN 0 stage IA, ER 95%, PR 0%, HER-2 negative, Ki-67 20% Right mastectomy: Benign       CHIEF COMPLIANT: Follow-up after bilateral mastectomies  INTERVAL HISTORY: [name] [name] is a 51 year old with above-mentioned history of left breast cancer which was recently diagnosed. Previously she had right breast cancer. Because of recurrent disease, she underwent bilateral mastectomies and  is here today to discuss results. She is extremely sore underneath the axilla. She still has indwelling drains.  REVIEW OF SYSTEMS:   Constitutional: Denies fevers, chills or abnormal weight loss Eyes: Denies blurriness of vision Ears, nose, mouth, throat, and face: Denies mucositis or sore throat Respiratory: Denies cough, dyspnea or wheezes Cardiovascular: Denies palpitation, chest discomfort Gastrointestinal:  Denies nausea, heartburn or change in bowel habits Skin: Denies abnormal skin rashes Lymphatics: Denies new lymphadenopathy or easy  bruising Neurological:Denies numbness, tingling or new weaknesses Behavioral/Psych: Mood is stable, no new changes  Extremities: No lower extremity edema Breast: Bilateral mastectomies All other systems were reviewed with the patient and are negative.  I have reviewed the past medical history, past surgical history, social history and family history with the patient and they are unchanged from previous note.  ALLERGIES:  is allergic to food; penicillins; and tetracyclines & related.  MEDICATIONS:  Current Outpatient Prescriptions  Medication Sig Dispense Refill  . anastrozole (ARIMIDEX) 1 MG tablet Take 1 tablet (1 mg total) by mouth daily. 90 tablet 3  . atorvastatin (LIPITOR) 10 MG tablet Take 1 tablet (10 mg total) by mouth daily.    Marland Kitchen glimepiride (AMARYL) 4 MG tablet Take 1 tablet (4 mg total) by mouth daily with breakfast.    . metFORMIN (GLUCOPHAGE) 500 MG tablet Take 1 tablet (500 mg total) by mouth 4 (four) times daily.     No current facility-administered medications for this visit.     PHYSICAL EXAMINATION: ECOG PERFORMANCE STATUS: 1 - Symptomatic but completely ambulatory  Vitals:   08/04/16 1215  BP: 129/68  Pulse: 96  Resp: 18  Temp: 98.4 F (36.9 C)   Filed Weights   08/04/16 1215  Weight: 246 lb 8 oz (111.8 kg)    GENERAL:alert, no distress and comfortable SKIN: skin color, texture, turgor are normal, no rashes or significant lesions EYES: normal, Conjunctiva are pink and non-injected, sclera clear OROPHARYNX:no exudate, no erythema and lips, buccal mucosa, and tongue normal  NECK: supple, thyroid normal size, non-tender, without nodularity LYMPH:  no palpable lymphadenopathy in the cervical, axillary or inguinal LUNGS: clear to auscultation and percussion with normal breathing effort HEART: regular rate & rhythm and no murmurs and no lower extremity edema ABDOMEN:abdomen soft, non-tender and normal bowel sounds MUSCULOSKELETAL:no cyanosis of digits and  no clubbing  NEURO: alert & oriented x 3 with fluent speech, no focal motor/sensory deficits EXTREMITIES: No lower extremity edema BREAST: Recent bilateral mastectomies   LABORATORY DATA:  I have reviewed the data as listed   Chemistry      Component Value Date/Time   NA 138 07/18/2016 1510   NA 144 04/26/2014 1224   K 4.0 07/18/2016 1510   K 4.2 04/26/2014 1224   CL 104 07/18/2016 1510   CO2 24 07/18/2016 1510   CO2 27 04/26/2014 1224   BUN 10 07/18/2016 1510   BUN 14.7 04/26/2014 1224   CREATININE 0.82 07/18/2016 1510   CREATININE 1.0 04/26/2014 1224      Component Value Date/Time   CALCIUM 9.1 07/18/2016 1510   CALCIUM 9.5 04/26/2014 1224   ALKPHOS 71 04/26/2014 1224   AST 12 04/26/2014 1224   ALT 16 04/26/2014 1224   BILITOT 0.28 04/26/2014 1224       Lab Results  Component Value Date   WBC 6.3 04/26/2014   HGB 11.5 (L) 06/05/2014   HCT 36.9 04/26/2014   MCV 84.4 04/26/2014   PLT 336 04/26/2014   NEUTROABS 3.3 04/26/2014  ASSESSMENT & PLAN:  Breast cancer of upper-outer quadrant of right female breast Right breast cancer: Rt Lumpectomy 06/05/14: ILC 0.6 cm (rt Medial) 1.7 cm (Rt Lateral)Margins Neg, Grade 2 with LCIS, ER 100%, PR 99%, Ki 67 6%, Her 2 Neg, Oncotype DX recurrence score 8, risk of recurrence 6%, reexcision for margins benign, 0/3 lymph nodes negative T1b N0 M0 stage IA status post adjuvant radiation 07/24/2014 to 09/08/2014 Genetic testing: Normal  Left Breast cancer  Bilateral mastectomies 07/24/2016  Left mastectomy: ILC grade 2, 1.4 cm, margins negative, 0/2 lymph nodes negative, T1 CN 0 stage IA, ER 95%, PR 0%, HER-2 negative, Ki-67 20% Right mastectomy: Benign  Recommendation: 1. Oncotype DX on the final pathology. 3. Adjuvant hormonal therapy: Aromatase inhibitor therapy With anastrozole since she failed to tamoxifen treatment. I will send blood work for [facility] and estradiol to see if she is truly postmenopausal. If not done she will need  ovarian suppression along with anastrozole therapy.  We will call her with the results of both Oncotype DX as well as the recently performed blood work. Return to clinic based on Oncotype DX test result  I spent 25 minutes talking to the patient of which more than half was spent in counseling and coordination of care.  Orders Placed This Encounter  Procedures  . Follicle stimulating hormone    Standing Status:   Future    Number of Occurrences:   1    Standing Expiration Date:   09/08/2017  . Estradiol, Ultra Sens   The patient has a good understanding of the overall plan. she agrees with it. she will call with any problems that may develop before the next visit here.   Rulon Eisenmenger, MD 08/04/16

## 2016-08-04 NOTE — Telephone Encounter (Signed)
Ordered oncotype per Dr. Gudena.  Faxed requisition to pathology and confirmed receipt. 

## 2016-08-04 NOTE — Assessment & Plan Note (Signed)
Right breast cancer: Rt Lumpectomy 06/05/14: ILC 0.6 cm (rt Medial) 1.7 cm (Rt Lateral)Margins Neg, Grade 2 with LCIS, ER 100%, PR 99%, Ki 67 6%, Her 2 Neg, Oncotype DX recurrence score 8, risk of recurrence 6%, reexcision for margins benign, 0/3 lymph nodes negative T1b N0 M0 stage IA status post adjuvant radiation 07/24/2014 to 09/08/2014 Genetic testing: Normal  Left Breast cancer  Bilateral mastectomies 07/24/2016  Left mastectomy: ILC grade 2, 1.4 cm, margins negative, 0/2 lymph nodes negative, T1 CN 0 stage IA, ER 95%, PR 0%, HER-2 negative, Ki-67 20% Right mastectomy: Benign  Recommendation: 1. Oncotype DX on the final pathology. 3. Adjuvant hormonal therapy: Aromatase inhibitor therapy plus ovarian suppression since she failed to tamoxifen treatment.  Return to clinic based on Oncotype DX test result Patient had earlier made up her plans to move to [facility]

## 2016-08-05 LAB — FOLLICLE STIMULATING HORMONE: FSH: 21.7 m[IU]/mL

## 2016-08-08 DIAGNOSIS — Z170 Estrogen receptor positive status [ER+]: Secondary | ICD-10-CM | POA: Diagnosis not present

## 2016-08-08 DIAGNOSIS — C50212 Malignant neoplasm of upper-inner quadrant of left female breast: Secondary | ICD-10-CM | POA: Diagnosis not present

## 2016-08-08 LAB — ESTRADIOL, ULTRA SENS

## 2016-08-11 ENCOUNTER — Telehealth: Payer: Self-pay | Admitting: *Deleted

## 2016-08-11 ENCOUNTER — Ambulatory Visit (HOSPITAL_BASED_OUTPATIENT_CLINIC_OR_DEPARTMENT_OTHER): Payer: 59 | Admitting: Hematology and Oncology

## 2016-08-11 ENCOUNTER — Encounter (HOSPITAL_COMMUNITY): Payer: Self-pay

## 2016-08-11 ENCOUNTER — Encounter: Payer: Self-pay | Admitting: Hematology and Oncology

## 2016-08-11 VITALS — BP 148/88 | HR 93 | Temp 98.0°F | Resp 18 | Ht 67.5 in | Wt 243.3 lb

## 2016-08-11 DIAGNOSIS — Z853 Personal history of malignant neoplasm of breast: Secondary | ICD-10-CM

## 2016-08-11 DIAGNOSIS — Z170 Estrogen receptor positive status [ER+]: Secondary | ICD-10-CM | POA: Diagnosis not present

## 2016-08-11 DIAGNOSIS — C50411 Malignant neoplasm of upper-outer quadrant of right female breast: Secondary | ICD-10-CM

## 2016-08-11 DIAGNOSIS — C50212 Malignant neoplasm of upper-inner quadrant of left female breast: Secondary | ICD-10-CM

## 2016-08-11 MED ORDER — LIDOCAINE-PRILOCAINE 2.5-2.5 % EX CREA: TOPICAL_CREAM | CUTANEOUS | 3 refills | Status: DC

## 2016-08-11 MED ORDER — PROCHLORPERAZINE MALEATE 10 MG PO TABS: 10.0000 mg | ORAL_TABLET | Freq: Four times a day (QID) | ORAL | 1 refills | Status: DC | PRN

## 2016-08-11 MED ORDER — DEXAMETHASONE 4 MG PO TABS: 4.0000 mg | ORAL_TABLET | Freq: Every day | ORAL | 1 refills | Status: DC

## 2016-08-11 MED ORDER — ONDANSETRON HCL 8 MG PO TABS: 8.0000 mg | ORAL_TABLET | Freq: Two times a day (BID) | ORAL | 1 refills | Status: DC | PRN

## 2016-08-11 MED ORDER — LORAZEPAM 0.5 MG PO TABS: 0.5000 mg | ORAL_TABLET | Freq: Four times a day (QID) | ORAL | 0 refills | Status: DC | PRN

## 2016-08-11 NOTE — Telephone Encounter (Signed)
Received oncotype score of 26/17%. Physician team notified. Called pt with results. Scheduled and confirmed appt for 08/11/16 at 4pm to see Dr. Lindi Adie to discuss the results.

## 2016-08-11 NOTE — Progress Notes (Signed)
Patient Care Team: [facility], [facility] [place] as PCP - General (Family Medicine) Jovita Kussmaul, MD as Consulting Physician (General Surgery) Nicholas Lose, MD as Consulting Physician (Hematology and Oncology) Gery Pray, MD as Consulting Physician (Radiation Oncology) Rockwell Germany, RN as Registered Nurse Mauro Kaufmann, RN as Registered Nurse Holley Bouche, NP as Nurse Practitioner (Nurse Practitioner) Sylvan Cheese, NP as Nurse Practitioner (Nurse Practitioner)  DIAGNOSIS:  Encounter Diagnosis  Name Primary?  . Malignant neoplasm of upper-outer quadrant of right breast in female, estrogen receptor positive ([place])     SUMMARY OF ONCOLOGIC HISTORY:   Breast cancer of upper-outer quadrant of right female breast ([place])   04/07/2014 Mammogram    Right breast: calcifications       04/14/2014 Initial Biopsy    Right breast needle biopsy: Mammary carcinoma in situ with calcifications at 9 to 10:00 position ER+ (95%), PR+ (66%), DCIS plus LCIS, heterogeneous somewhat E Cadherin positive some negative      04/26/2014 Breast MRI    Biopsy-proven DCIS is identified in the outer quadrant; ~ 3 cm AP diameter linear clumped NME extends from the medial biopsy cavity posteriorly in the central breast which is suspicious for disease extension      04/26/2014 Clinical Stage    Stage 0: Tis Nx      06/05/2014 Definitive Surgery    Right lumpectomy [name]): ILC, grade 2/3, 0.6 cm, LCIS invasive ca focally present at inferior margin (rt Medial); ILC, grade 2-3, 1.7 cm with neg margins with LCIS (Rt Lateral) ER 100%, PR 99%, Ki 67 6%, HER2/neu neg (ratio 1.14)      06/05/2014 Oncotype testing    Score: 8 (ROR 6%)      06/05/2014 Pathologic Stage    Stage IA: mpT1c pNx      06/23/2014 Surgery    Right breast excision: Benign, 0/3 lymph nodes negative      07/24/2014 - 09/08/2014 Radiation Therapy    Adjuvant RT completed [name]): Right breast 45 Gy over  25 fractions.  Right breast boost 16 Gy over 8 fractions.  Total dose: 60 Gy.        09/25/2014 -  Anti-estrogen oral therapy    Tamoxifen 20 mg daily (Parminder Cupples).  Planned duration of therapy 5-10 years.      11/02/2014 Procedure    Breast/Ovarian panel revealed no clinically significant variant at ATM, BARD1, BRCA1, BRCA2, BRIP1, CDH1, CHEK2, FANCC, MLH1, MSH2, MSH6, NBN, PALB2, PMS2, PTEN, RAD51C, RAD51D, TP53, and XRCC2      11/03/2014 Survivorship    Survivorship visit completed and copy of survivorship care plan provided to patient.      05/07/2016 Breast MRI    Left breast non-mass enhancement spanning 6.9 cm. Small enhancing mass 5.6 cm, no lymph nodes. Right breast normal      05/20/2016 Pathology Results    Left breast biopsy posterior outer: Invasive lobular cancer with LCIS, ER 95%, PR 0%, Ki-67 20%, HER-2 negative ratio 1.25      07/24/2016 Surgery    Left mastectomy: ILC grade 2, 1.4 cm, margins negative, 0/2 lymph nodes negative, T1 CN 0 stage IA, ER 95%, PR 0%, HER-2 negative, Ki-67 20% Right mastectomy: Benign      08/11/2016 Oncotype testing    Oncotype score 26:17% risk of recurrence, high intermediate risk       CHIEF COMPLIANT: Follow-up to discuss treatment plan with Oncotype score of 26  INTERVAL HISTORY: [name] [name] is a 51 year old  with above-mentioned history of relapsed left breast cancer who underwent mastectomy and she had Oncotype testing which revealed a score of 26. She is here accompanied by her husband to discuss treatment plan.  REVIEW OF SYSTEMS:   Constitutional: Denies fevers, chills or abnormal weight loss Eyes: Denies blurriness of vision Ears, nose, mouth, throat, and face: Denies mucositis or sore throat Respiratory: Denies cough, dyspnea or wheezes Cardiovascular: Denies palpitation, chest discomfort Gastrointestinal:  Denies nausea, heartburn or change in bowel habits Skin: Denies abnormal skin rashes Lymphatics: Denies new lymphadenopathy  or easy bruising Neurological:Denies numbness, tingling or new weaknesses Behavioral/Psych: Mood is stable, no new changes  Extremities: No lower extremity edema All other systems were reviewed with the patient and are negative.  I have reviewed the past medical history, past surgical history, social history and family history with the patient and they are unchanged from previous note.  ALLERGIES:  is allergic to food; penicillins; and tetracyclines & related.  MEDICATIONS:  Current Outpatient Prescriptions  Medication Sig Dispense Refill  . anastrozole (ARIMIDEX) 1 MG tablet Take 1 tablet (1 mg total) by mouth daily. 90 tablet 3  . atorvastatin (LIPITOR) 10 MG tablet Take 1 tablet (10 mg total) by mouth daily.    Marland Kitchen glimepiride (AMARYL) 4 MG tablet Take 1 tablet (4 mg total) by mouth daily with breakfast.    . metFORMIN (GLUCOPHAGE) 500 MG tablet Take 1 tablet (500 mg total) by mouth 4 (four) times daily. (Patient taking differently: Take 1,000 mg by mouth 2 (two) times daily with a meal. )     No current facility-administered medications for this visit.     PHYSICAL EXAMINATION: ECOG PERFORMANCE STATUS: 1 - Symptomatic but completely ambulatory  Vitals:   08/11/16 1604  BP: (!) 148/88  Pulse: 93  Resp: 18  Temp: 98 F (36.7 C)   Filed Weights   08/11/16 1604  Weight: 243 lb 4.8 oz (110.4 kg)    GENERAL:alert, no distress and comfortable SKIN: skin color, texture, turgor are normal, no rashes or significant lesions EYES: normal, Conjunctiva are pink and non-injected, sclera clear OROPHARYNX:no exudate, no erythema and lips, buccal mucosa, and tongue normal  NECK: supple, thyroid normal size, non-tender, without nodularity LYMPH:  no palpable lymphadenopathy in the cervical, axillary or inguinal LUNGS: clear to auscultation and percussion with normal breathing effort HEART: regular rate & rhythm and no murmurs and no lower extremity edema ABDOMEN:abdomen soft, non-tender  and normal bowel sounds MUSCULOSKELETAL:no cyanosis of digits and no clubbing  NEURO: alert & oriented x 3 with fluent speech, no focal motor/sensory deficits EXTREMITIES: No lower extremity edema  LABORATORY DATA:  I have reviewed the data as listed   Chemistry      Component Value Date/Time   NA 138 07/18/2016 1510   NA 144 04/26/2014 1224   K 4.0 07/18/2016 1510   K 4.2 04/26/2014 1224   CL 104 07/18/2016 1510   CO2 24 07/18/2016 1510   CO2 27 04/26/2014 1224   BUN 10 07/18/2016 1510   BUN 14.7 04/26/2014 1224   CREATININE 0.82 07/18/2016 1510   CREATININE 1.0 04/26/2014 1224      Component Value Date/Time   CALCIUM 9.1 07/18/2016 1510   CALCIUM 9.5 04/26/2014 1224   ALKPHOS 71 04/26/2014 1224   AST 12 04/26/2014 1224   ALT 16 04/26/2014 1224   BILITOT 0.28 04/26/2014 1224       Lab Results  Component Value Date   WBC 6.3 04/26/2014  HGB 11.5 (L) 06/05/2014   HCT 36.9 04/26/2014   MCV 84.4 04/26/2014   PLT 336 04/26/2014   NEUTROABS 3.3 04/26/2014    ASSESSMENT & PLAN:  Breast cancer of upper-outer quadrant of right female breast Right breast cancer: Rt Lumpectomy 06/05/14: ILC 0.6 cm (rt Medial) 1.7 cm (Rt Lateral)Margins Neg, Grade 2 with LCIS, ER 100%, PR 99%, Ki 67 6%, Her 2 Neg, Oncotype DX recurrence score 8, risk of recurrence 6%, reexcision for margins benign, 0/3 lymph nodes negative T1b N0 M0 stage IA status post adjuvant radiation 07/24/2014 to 09/08/2014 Genetic testing: Normal  Left Breast cancer  Bilateral mastectomies 07/24/2016  Left mastectomy: ILC grade 2, 1.4 cm, margins negative, 0/2 lymph nodes negative, T1 CN 0 stage IA, ER 95%, PR 0%, HER-2 negative, Ki-67 20% Right mastectomy: Benign  Oncotype DX score 26:17% risk of distant recurrence  Recommendation: 1. Adjuvant chemotherapy with Taxotere and Cytoxan every 3 weeks 4 cycles 2. followed by anastrozole 1 mg daily with ovarian suppression (labs on 08/04/2016 showed [place] 21.7 and  estradiol less than 2.5)   Chemotherapy Counseling: I discussed the risks and benefits of chemotherapy including the risks of nausea/ vomiting, risk of infection from low WBC count, fatigue due to chemo or anemia, bruising or bleeding due to low platelets, mouth sores, loss/ change in taste and decreased appetite. Liver and kidney function will be monitored through out chemotherapy as abnormalities in liver and kidney function may be a side effect of treatment. Risk of permanent bone marrow dysfunction and leukemia due to chemo were also discussed.  Patient will need a port, chemotherapy class and then start chemotherapy.   I spent 25 minutes talking to the patient of which more than half was spent in counseling and coordination of care.  No orders of the defined types were placed in this encounter.  The patient has a good understanding of the overall plan. she agrees with it. she will call with any problems that may develop before the next visit here.   Rulon Eisenmenger, MD 08/11/16

## 2016-08-11 NOTE — Progress Notes (Signed)
START ON PATHWAY REGIMEN - Breast     A cycle is every 21 days:     Docetaxel      Cyclophosphamide   **Always confirm dose/schedule in your pharmacy ordering system**    Patient Characteristics: Postoperative without Neoadjuvant Therapy (Pathologic Staging), Invasive Disease, Adjuvant Therapy, Node Negative, HER2 Negative/Unknown/Equivocal, ER Positive, Oncotype Intermediate Risk (18 - 30), Chemotherapy Preferred Therapeutic Status: Postoperative without Neoadjuvant Therapy (Pathologic Staging) AJCC Grade: G2 AJCC N Category: pN0 AJCC M Category: cM0 ER Status: Positive (+) AJCC 8 Stage Grouping: IA HER2 Status: Negative (-) Oncotype Dx Recurrence Score: 26 AJCC T Category: pT1c PR Status: Negative (-) Has this patient completed genomic testing? Yes - Oncotype DX(R) Treatment Preferred: Chemotherapy Intent of Therapy: Curative Intent, Discussed with Patient 

## 2016-08-11 NOTE — Assessment & Plan Note (Signed)
Right breast cancer: Rt Lumpectomy 06/05/14: ILC 0.6 cm (rt Medial) 1.7 cm (Rt Lateral)Margins Neg, Grade 2 with LCIS, ER 100%, PR 99%, Ki 67 6%, Her 2 Neg, Oncotype DX recurrence score 8, risk of recurrence 6%, reexcision for margins benign, 0/3 lymph nodes negative T1b N0 M0 stage IA status post adjuvant radiation 07/24/2014 to 09/08/2014 Genetic testing: Normal  Left Breast cancer  Bilateral mastectomies 07/24/2016  Left mastectomy: ILC grade 2, 1.4 cm, margins negative, 0/2 lymph nodes negative, T1 CN 0 stage IA, ER 95%, PR 0%, HER-2 negative, Ki-67 20% Right mastectomy: Benign  Oncotype DX score 26:17% risk of distant recurrence  Recommendation: 1. Adjuvant chemotherapy with Taxotere and Cytoxan every 3 weeks 4 cycles 2. followed by anastrozole 1 mg daily with ovarian suppression (labs on 08/04/2016 showed [place] 21.7 and estradiol less than 2.5)   Chemotherapy Counseling: I discussed the risks and benefits of chemotherapy including the risks of nausea/ vomiting, risk of infection from low WBC count, fatigue due to chemo or anemia, bruising or bleeding due to low platelets, mouth sores, loss/ change in taste and decreased appetite. Liver and kidney function will be monitored through out chemotherapy as abnormalities in liver and kidney function may be a side effect of treatment. Risk of permanent bone marrow dysfunction and leukemia due to chemo were also discussed.  Patient will need a port, chemotherapy class and then start chemotherapy.

## 2016-08-12 ENCOUNTER — Other Ambulatory Visit: Payer: Self-pay

## 2016-08-12 ENCOUNTER — Encounter: Payer: Self-pay | Admitting: *Deleted

## 2016-08-14 ENCOUNTER — Other Ambulatory Visit: Payer: 59

## 2016-08-14 ENCOUNTER — Encounter: Payer: Self-pay | Admitting: *Deleted

## 2016-08-15 ENCOUNTER — Encounter: Payer: Self-pay | Admitting: Hematology and Oncology

## 2016-08-15 ENCOUNTER — Ambulatory Visit: Payer: Self-pay | Admitting: General Surgery

## 2016-08-15 NOTE — Progress Notes (Signed)
Submitted auth request for Ondansetron today.  It was approved from 07/16/16 to 08/15/17.

## 2016-08-18 ENCOUNTER — Encounter: Payer: Self-pay | Admitting: Hematology and Oncology

## 2016-08-18 NOTE — Progress Notes (Signed)
Called pt to introduce myself as her Arboriculturist and to discuss copay assistance for Neulasta and to inform her of the [employer].  Requested she return my call.

## 2016-08-19 ENCOUNTER — Encounter: Payer: Self-pay | Admitting: Hematology and Oncology

## 2016-08-19 ENCOUNTER — Encounter (HOSPITAL_COMMUNITY): Payer: Self-pay

## 2016-08-19 ENCOUNTER — Encounter (HOSPITAL_COMMUNITY)
Admission: RE | Admit: 2016-08-19 | Discharge: 2016-08-19 | Disposition: A | Discharge status: Home / Self Care | Payer: 59 | Source: Ambulatory Visit | Attending: General Surgery | Admitting: General Surgery

## 2016-08-19 DIAGNOSIS — Z170 Estrogen receptor positive status [ER+]: Secondary | ICD-10-CM | POA: Diagnosis not present

## 2016-08-19 DIAGNOSIS — Z881 Allergy status to other antibiotic agents status: Secondary | ICD-10-CM | POA: Diagnosis not present

## 2016-08-19 DIAGNOSIS — Z880 Allergy status to penicillin: Secondary | ICD-10-CM | POA: Diagnosis not present

## 2016-08-19 DIAGNOSIS — E119 Type 2 diabetes mellitus without complications: Secondary | ICD-10-CM | POA: Diagnosis not present

## 2016-08-19 DIAGNOSIS — Z91013 Allergy to seafood: Secondary | ICD-10-CM | POA: Diagnosis not present

## 2016-08-19 DIAGNOSIS — Z79899 Other long term (current) drug therapy: Secondary | ICD-10-CM | POA: Diagnosis not present

## 2016-08-19 DIAGNOSIS — Z7984 Long term (current) use of oral hypoglycemic drugs: Secondary | ICD-10-CM | POA: Diagnosis not present

## 2016-08-19 DIAGNOSIS — Z9013 Acquired absence of bilateral breasts and nipples: Secondary | ICD-10-CM | POA: Diagnosis not present

## 2016-08-19 DIAGNOSIS — Z6837 Body mass index (BMI) 37.0-37.9, adult: Secondary | ICD-10-CM | POA: Diagnosis not present

## 2016-08-19 DIAGNOSIS — C50912 Malignant neoplasm of unspecified site of left female breast: Secondary | ICD-10-CM | POA: Diagnosis not present

## 2016-08-19 LAB — BASIC METABOLIC PANEL
ANION GAP: 14 (ref 5–15)
BUN: 13 mg/dL (ref 6–20)
CALCIUM: 9.1 mg/dL (ref 8.9–10.3)
CO2: 23 mmol/L (ref 22–32)
Chloride: 103 mmol/L (ref 101–111)
Creatinine, Ser: 0.86 mg/dL (ref 0.44–1.00)
GFR calc Af Amer: >60 mL/min (ref >60)
GFR calc non Af Amer: >60 mL/min (ref >60)
GLUCOSE: 134 mg/dL — AB (ref 65–99)
POTASSIUM: 3.6 mmol/L (ref 3.5–5.1)
Sodium: 140 mmol/L (ref 135–145)

## 2016-08-19 LAB — CBC
HEMATOCRIT: 31.4 % — AB (ref 36.0–46.0)
HEMOGLOBIN: 9.6 g/dL — AB (ref 12.0–15.0)
MCH: 25.7 pg — AB (ref 26.0–34.0)
MCHC: 30.6 g/dL (ref 30.0–36.0)
MCV: 84 fL (ref 78.0–100.0)
Platelets: 415 10*3/uL — ABNORMAL HIGH (ref 150–400)
RBC: 3.74 MIL/uL — ABNORMAL LOW (ref 3.87–5.11)
RDW: 15.8 % — ABNORMAL HIGH (ref 11.5–15.5)
WBC: 6.4 10*3/uL (ref 4.0–10.5)

## 2016-08-19 LAB — GLUCOSE, CAPILLARY: Glucose-Capillary: 131 mg/dL — ABNORMAL HIGH (ref 65–99)

## 2016-08-19 NOTE — Progress Notes (Signed)
[facility]P - [facility] - denies  Chest x-ray - not needed EKG - 07/18/16 Stress Test -denies  ECHO - denies Cardiac Cath - denies    Fasting Blood Sugar - 110s Checks Blood Sugar __1___ times a day     Patient denies shortness of breath, fever, cough and chest pain at PAT appointment   Patient verbalized understanding of instructions that were given to them at the PAT appointment. Patient was also instructed that they will need to review over the PAT instructions again at home before surgery.

## 2016-08-19 NOTE — Progress Notes (Signed)
Pt returned my call so I informed her of the Neulasta First Step program.  Pt stated she has met her deductible so everything should be paid at 100%.  I also went over the [employer] as well as the income requirement but she exceeds that requirement so she doesn't qualify for that grant.  I will meet her on 09/01/16 to give her my card for any questions or concerns she may have in the future.

## 2016-08-19 NOTE — Pre-Procedure Instructions (Addendum)
[name] [name]  08/19/2016      [id number] [place], [place] [address] Phone: [phone] Fax: [phone]  [facility] [place], [place] [address] Phone: [phone] Fax: [phone]  [facility] [place], [place] [address] [place] [address] Phone: [phone] Fax: [phone]    Your procedure is scheduled on July 12  Report to [facility] Admitting at 1:52  P.M.  Call this number if you have problems the morning of surgery:  [phone]   Remember:  Do not eat food or drink liquids after midnight except follow these instructions  Please complete your 8oz of Water that was given to you at your preadmission appointment by 1150 on the day of your surgery    Take these medicines the morning of surgery with A SIP OF WATER anastrozole (ARIMIDEX),  HYDROcodone-acetaminophen (NORCO/VICODIN), LORazepam (ATIVAN),   7 days prior to surgery STOP taking any Aspirin, Aleve, Naproxen, Ibuprofen, Motrin, Advil, Goody's, BC's, all herbal medications, fish oil, and all vitamins  WHAT DO I DO ABOUT MY DIABETES MEDICATION?   Marland Kitchen Do not take oral diabetes medicines (pills) the morning of surgery.glimepiride (AMARYL) and metFORMIN (GLUCOPHAGE)   How to Manage Your Diabetes Before and After Surgery  Why is it important to control my blood sugar before and after surgery? . Improving blood sugar levels before and after surgery helps healing and can limit problems. . A way of improving blood sugar control is eating a healthy diet by: o  Eating less sugar and carbohydrates o  Increasing activity/exercise o  Talking with your doctor about reaching your blood sugar goals . High blood sugars (greater than 180 mg/dL) can raise your risk of infections and slow your recovery, so you will need to focus on  controlling your diabetes during the weeks before surgery. . Make sure that the doctor who takes care of your diabetes knows about your planned surgery including the date and location.  How do I manage my blood sugar before surgery? . Check your blood sugar at least 4 times a day, starting 2 days before surgery, to make sure that the level is not too high or low. o Check your blood sugar the morning of your surgery when you wake up and every 2 hours until you get to the Short Stay unit. . If your blood sugar is less than 70 mg/dL, you will need to treat for low blood sugar: o Do not take insulin. o Treat a low blood sugar (less than 70 mg/dL) with  cup of clear juice (cranberry or apple), 4 glucose tablets, OR glucose gel. o Recheck blood sugar in 15 minutes after treatment (to make sure it is greater than 70 mg/dL). If your blood sugar is not greater than 70 mg/dL on recheck, call [phone] for further instructions. . Report your blood sugar to the short stay nurse when you get to Short Stay.  . If you are admitted to the hospital after surgery: o Your blood sugar will be checked by the staff and you will probably be given insulin after surgery (instead of oral diabetes medicines) to make sure you have good blood sugar levels. o The goal for blood sugar control after surgery is 80-180 mg/dL    Do not wear jewelry, make-up or nail polish.  Do not wear lotions, powders, or perfumes, or deoderant.  Do not shave 48 hours prior to surgery.  .  Do not bring valuables to the hospital.  [facility] is not responsible for any belongings or valuables.  Contacts, dentures or bridgework may not be worn into surgery.  Leave your suitcase in the car.  After surgery it may be brought to your room.  For patients admitted to the hospital, discharge time will be determined by your treatment team.  Patients discharged the day of surgery will not be allowed to drive home.    Special instructions:    [place]- Preparing For Surgery  Before surgery, you can play an important role. Because skin is not sterile, your skin needs to be as free of germs as possible. You can reduce the number of germs on your skin by washing with CHG (chlorahexidine gluconate) Soap before surgery.  CHG is an antiseptic cleaner which kills germs and bonds with the skin to continue killing germs even after washing.  Please do not use if you have an allergy to CHG or antibacterial soaps. If your skin becomes reddened/irritated stop using the CHG.  Do not shave (including legs and underarms) for at least 48 hours prior to first CHG shower. It is OK to shave your face.  Please follow these instructions carefully.   1. Shower the NIGHT BEFORE SURGERY and the MORNING OF SURGERY with CHG.   2. If you chose to wash your hair, wash your hair first as usual with your normal shampoo.  3. After you shampoo, rinse your hair and body thoroughly to remove the shampoo.  4. Use CHG as you would any other liquid soap. You can apply CHG directly to the skin and wash gently with a scrungie or a clean washcloth.   5. Apply the CHG Soap to your body ONLY FROM THE NECK DOWN.  Do not use on open wounds or open sores. Avoid contact with your eyes, ears, mouth and genitals (private parts). Wash genitals (private parts) with your normal soap.  6. Wash thoroughly, paying special attention to the area where your surgery will be performed.  7. Thoroughly rinse your body with warm water from the neck down.  8. DO NOT shower/wash with your normal soap after using and rinsing off the CHG Soap.  9. Pat yourself dry with a CLEAN TOWEL.   10. Wear CLEAN PAJAMAS   11. Place CLEAN SHEETS on your bed the night of your first shower and DO NOT SLEEP WITH PETS.    Day of Surgery: Do not apply any deodorants/lotions. Please wear clean clothes to the hospital/surgery center.      Please read over the following fact sheets that you were  given.

## 2016-08-20 LAB — HEMOGLOBIN A1C
HEMOGLOBIN A1C: 6.9 % — AB (ref 4.8–5.6)
MEAN PLASMA GLUCOSE: 151 mg/dL

## 2016-08-20 MED ORDER — VANCOMYCIN HCL 10 G IV SOLR
1500.0000 mg | INTRAVENOUS | Status: AC
  Administered 2016-08-21: 1500 mg via INTRAVENOUS
  Filled 2016-08-20: qty 1500

## 2016-08-20 NOTE — Progress Notes (Signed)
Anesthesia Chart Review:  Pt is a 51 year old female scheduled for insertion of Port-A-Cath with [place] on 08/21/2016 with Jovita Kussmaul, MD  - Receives primary care at [facility] family medicine at [facility] includes:  DM, breast cancer. Never smoker. BMI 37. S/p L mastectomy, breast reconstruction 07/24/16  Medications include: Arimidex, Lipitor, dexamethasone, glimepiride, metformin  BP (!) 146/76   Pulse 92   Temp 36.9 C (Oral)   Resp 18   Ht 5' 7.5" (1.715 m)   Wt 241 lb 6.5 oz (109.5 kg)   LMP 10/06/2014 (Within Weeks) Comment: "no chance pregnant"  SpO2 99%   BMI 37.25 kg/m    Preoperative labs reviewed.  H/H 9.6/31.4. I notified Abigail Butts in Dr. Ethlyn Gallery office of anemia.   EKG 07/18/16: NSR. LVH.  If no changes, I anticipate pt can proceed with surgery as scheduled.   Willeen Cass, FNP-BC [facility]/Anesthesiology Phone: [phone] 08/20/2016 10:31 AM

## 2016-08-21 ENCOUNTER — Ambulatory Visit (HOSPITAL_COMMUNITY)
Admission: RE | Admit: 2016-08-21 | Discharge: 2016-08-21 | Disposition: A | Discharge status: Home / Self Care | Payer: 59 | Source: Ambulatory Visit | Attending: General Surgery | Admitting: General Surgery

## 2016-08-21 ENCOUNTER — Encounter (HOSPITAL_COMMUNITY)
Admission: RE | Disposition: A | Discharge status: Home / Self Care | Payer: Self-pay | Source: Ambulatory Visit | Attending: General Surgery

## 2016-08-21 ENCOUNTER — Ambulatory Visit (HOSPITAL_COMMUNITY): Payer: 59 | Admitting: Emergency Medicine

## 2016-08-21 ENCOUNTER — Encounter (HOSPITAL_COMMUNITY): Payer: Self-pay

## 2016-08-21 ENCOUNTER — Ambulatory Visit (HOSPITAL_COMMUNITY): Payer: 59

## 2016-08-21 ENCOUNTER — Ambulatory Visit (HOSPITAL_COMMUNITY): Payer: 59 | Admitting: Certified Registered Nurse Anesthetist

## 2016-08-21 DIAGNOSIS — Z9013 Acquired absence of bilateral breasts and nipples: Secondary | ICD-10-CM | POA: Insufficient documentation

## 2016-08-21 DIAGNOSIS — Z95828 Presence of other vascular implants and grafts: Secondary | ICD-10-CM

## 2016-08-21 DIAGNOSIS — C50912 Malignant neoplasm of unspecified site of left female breast: Secondary | ICD-10-CM | POA: Diagnosis not present

## 2016-08-21 DIAGNOSIS — Z452 Encounter for adjustment and management of vascular access device: Secondary | ICD-10-CM

## 2016-08-21 DIAGNOSIS — Z91013 Allergy to seafood: Secondary | ICD-10-CM | POA: Insufficient documentation

## 2016-08-21 DIAGNOSIS — Z6837 Body mass index (BMI) 37.0-37.9, adult: Secondary | ICD-10-CM | POA: Insufficient documentation

## 2016-08-21 DIAGNOSIS — Z881 Allergy status to other antibiotic agents status: Secondary | ICD-10-CM | POA: Insufficient documentation

## 2016-08-21 DIAGNOSIS — E119 Type 2 diabetes mellitus without complications: Secondary | ICD-10-CM | POA: Insufficient documentation

## 2016-08-21 DIAGNOSIS — Z880 Allergy status to penicillin: Secondary | ICD-10-CM | POA: Insufficient documentation

## 2016-08-21 DIAGNOSIS — Z7984 Long term (current) use of oral hypoglycemic drugs: Secondary | ICD-10-CM | POA: Insufficient documentation

## 2016-08-21 DIAGNOSIS — C50411 Malignant neoplasm of upper-outer quadrant of right female breast: Secondary | ICD-10-CM | POA: Diagnosis not present

## 2016-08-21 DIAGNOSIS — Z170 Estrogen receptor positive status [ER+]: Secondary | ICD-10-CM | POA: Insufficient documentation

## 2016-08-21 DIAGNOSIS — Z79899 Other long term (current) drug therapy: Secondary | ICD-10-CM | POA: Insufficient documentation

## 2016-08-21 HISTORY — PX: PORTACATH PLACEMENT: SHX2246

## 2016-08-21 LAB — GLUCOSE, CAPILLARY: Glucose-Capillary: 98 mg/dL (ref 65–99)

## 2016-08-21 SURGERY — INSERTION, TUNNELED CENTRAL VENOUS DEVICE, WITH PORT
Anesthesia: General | Site: Chest

## 2016-08-21 MED ORDER — BUPIVACAINE HCL (PF) 0.25 % IJ SOLN
INTRAMUSCULAR | Status: AC
  Filled 2016-08-21: qty 30

## 2016-08-21 MED ORDER — HEPARIN SOD (PORK) LOCK FLUSH 100 UNIT/ML IV SOLN
INTRAVENOUS | Status: DC | PRN
  Administered 2016-08-21: 500 [IU] via INTRAVENOUS

## 2016-08-21 MED ORDER — LIDOCAINE HCL (CARDIAC) 20 MG/ML IV SOLN
INTRAVENOUS | Status: DC | PRN
  Administered 2016-08-21: 100 mg via INTRAVENOUS

## 2016-08-21 MED ORDER — PROPOFOL 10 MG/ML IV BOLUS
INTRAVENOUS | Status: DC | PRN
  Administered 2016-08-21: 20 mg via INTRAVENOUS
  Administered 2016-08-21: 200 mg via INTRAVENOUS
  Administered 2016-08-21 (×2): 20 mg via INTRAVENOUS

## 2016-08-21 MED ORDER — ONDANSETRON HCL 4 MG/2ML IJ SOLN: 4.0000 mg | Freq: Once | INTRAMUSCULAR | Status: DC | PRN

## 2016-08-21 MED ORDER — BUPIVACAINE-EPINEPHRINE 0.25% -1:200000 IJ SOLN
INTRAMUSCULAR | Status: AC
  Filled 2016-08-21: qty 1

## 2016-08-21 MED ORDER — FENTANYL CITRATE (PF) 250 MCG/5ML IJ SOLN
INTRAMUSCULAR | Status: AC
  Filled 2016-08-21: qty 5

## 2016-08-21 MED ORDER — BUPIVACAINE HCL (PF) 0.25 % IJ SOLN
INTRAMUSCULAR | Status: DC | PRN
  Administered 2016-08-21: 10 mL

## 2016-08-21 MED ORDER — LACTATED RINGERS IV SOLN
INTRAVENOUS | Status: DC
  Administered 2016-08-21: 15:00:00 via INTRAVENOUS

## 2016-08-21 MED ORDER — FENTANYL CITRATE (PF) 100 MCG/2ML IJ SOLN
INTRAMUSCULAR | Status: DC | PRN
  Administered 2016-08-21 (×3): 25 ug via INTRAVENOUS
  Administered 2016-08-21: 50 ug via INTRAVENOUS
  Administered 2016-08-21 (×3): 25 ug via INTRAVENOUS

## 2016-08-21 MED ORDER — MIDAZOLAM HCL 2 MG/2ML IJ SOLN
INTRAMUSCULAR | Status: AC
  Filled 2016-08-21: qty 2

## 2016-08-21 MED ORDER — MEPERIDINE HCL 25 MG/ML IJ SOLN: 6.2500 mg | INTRAMUSCULAR | Status: DC | PRN

## 2016-08-21 MED ORDER — ESMOLOL HCL 100 MG/10ML IV SOLN
INTRAVENOUS | Status: DC | PRN
  Administered 2016-08-21: 10 mg via INTRAVENOUS

## 2016-08-21 MED ORDER — DEXAMETHASONE SODIUM PHOSPHATE 10 MG/ML IJ SOLN
INTRAMUSCULAR | Status: DC | PRN
  Administered 2016-08-21: 10 mg via INTRAVENOUS

## 2016-08-21 MED ORDER — HYDROCODONE-ACETAMINOPHEN 5-325 MG PO TABS: 1.0000 | ORAL_TABLET | ORAL | 0 refills | Status: DC | PRN

## 2016-08-21 MED ORDER — 0.9 % SODIUM CHLORIDE (POUR BTL) OPTIME
TOPICAL | Status: DC | PRN
  Administered 2016-08-21: 1000 mL

## 2016-08-21 MED ORDER — CHLORHEXIDINE GLUCONATE CLOTH 2 % EX PADS: 6.0000 | MEDICATED_PAD | Freq: Once | CUTANEOUS | Status: DC

## 2016-08-21 MED ORDER — ONDANSETRON HCL 4 MG/2ML IJ SOLN
INTRAMUSCULAR | Status: DC | PRN
  Administered 2016-08-21: 4 mg via INTRAVENOUS

## 2016-08-21 MED ORDER — HEPARIN SOD (PORK) LOCK FLUSH 100 UNIT/ML IV SOLN
INTRAVENOUS | Status: AC
  Filled 2016-08-21: qty 5

## 2016-08-21 MED ORDER — MIDAZOLAM HCL 2 MG/2ML IJ SOLN
INTRAMUSCULAR | Status: DC | PRN
  Administered 2016-08-21 (×2): 1 mg via INTRAVENOUS

## 2016-08-21 MED ORDER — FENTANYL CITRATE (PF) 100 MCG/2ML IJ SOLN: 25.0000 ug | INTRAMUSCULAR | Status: DC | PRN

## 2016-08-21 MED ORDER — SODIUM CHLORIDE 0.9 % IV SOLN
INTRAVENOUS | Status: DC | PRN
  Administered 2016-08-21: 500 mL

## 2016-08-21 SURGICAL SUPPLY — 47 items
ADH SKN CLS APL DERMABOND .7 (GAUZE/BANDAGES/DRESSINGS) ×1
BAG DECANTER FOR FLEXI CONT (MISCELLANEOUS) ×2 IMPLANT
BLADE SURG 15 STRL LF DISP TIS (BLADE) ×1 IMPLANT
BLADE SURG 15 STRL SS (BLADE) ×2
CHLORAPREP W/TINT 10.5 ML (MISCELLANEOUS) ×2 IMPLANT
COVER SURGICAL LIGHT HANDLE (MISCELLANEOUS) ×2 IMPLANT
COVER TRANSDUCER ULTRASND GEL (DRAPE) IMPLANT
CRADLE DONUT ADULT HEAD (MISCELLANEOUS) ×2 IMPLANT
DERMABOND ADVANCED (GAUZE/BANDAGES/DRESSINGS) ×1
DERMABOND ADVANCED .7 DNX12 (GAUZE/BANDAGES/DRESSINGS) ×1 IMPLANT
DRAPE C-ARM 42X72 X-RAY (DRAPES) ×2 IMPLANT
DRAPE CHEST BREAST 15X10 FENES (DRAPES) ×2 IMPLANT
DRAPE UTILITY XL STRL (DRAPES) ×4 IMPLANT
ELECT CAUTERY BLADE 6.4 (BLADE) ×2 IMPLANT
ELECT REM PT RETURN 9FT ADLT (ELECTROSURGICAL) ×2
ELECTRODE REM PT RTRN 9FT ADLT (ELECTROSURGICAL) ×1 IMPLANT
GAUZE SPONGE 4X4 16PLY XRAY LF (GAUZE/BANDAGES/DRESSINGS) ×2 IMPLANT
GEL ULTRASOUND 20GR AQUASONIC (MISCELLANEOUS) IMPLANT
GLOVE BIO SURGEON STRL SZ7.5 (GLOVE) ×3 IMPLANT
GOWN STRL REUS W/ TWL LRG LVL3 (GOWN DISPOSABLE) ×2 IMPLANT
GOWN STRL REUS W/TWL LRG LVL3 (GOWN DISPOSABLE) ×6
INTRODUCER COOK 11FR (CATHETERS) IMPLANT
KIT BASIN OR (CUSTOM PROCEDURE TRAY) ×2 IMPLANT
KIT PORT POWER 8FR ISP CVUE (Catheter) ×1 IMPLANT
KIT ROOM TURNOVER OR (KITS) ×2 IMPLANT
NDL HYPO 25GX1X1/2 BEV (NEEDLE) ×1 IMPLANT
NEEDLE 22X1 1/2 (OR ONLY) (NEEDLE) ×2 IMPLANT
NEEDLE HYPO 25GX1X1/2 BEV (NEEDLE) ×2 IMPLANT
NS IRRIG 1000ML POUR BTL (IV SOLUTION) ×2 IMPLANT
PACK SURGICAL SETUP 50X90 (CUSTOM PROCEDURE TRAY) ×2 IMPLANT
PAD ARMBOARD 7.5X6 YLW CONV (MISCELLANEOUS) ×3 IMPLANT
PENCIL BUTTON HOLSTER BLD 10FT (ELECTRODE) ×2 IMPLANT
SET INTRODUCER 12FR PACEMAKER (SHEATH) IMPLANT
SET SHEATH INTRODUCER 10FR (MISCELLANEOUS) IMPLANT
SHEATH COOK PEEL AWAY SET 9F (SHEATH) IMPLANT
SUT MNCRL AB 4-0 PS2 18 (SUTURE) ×2 IMPLANT
SUT PROLENE 2 0 SH 30 (SUTURE) ×4 IMPLANT
SUT SILK 2 0 (SUTURE)
SUT SILK 2-0 18XBRD TIE 12 (SUTURE) IMPLANT
SUT VIC AB 3-0 SH 27 (SUTURE) ×4
SUT VIC AB 3-0 SH 27XBRD (SUTURE) ×1 IMPLANT
SYR 10ML LL (SYRINGE) ×1 IMPLANT
SYR 20ML ECCENTRIC (SYRINGE) ×4 IMPLANT
SYR 5ML LUER SLIP (SYRINGE) ×2 IMPLANT
SYR CONTROL 10ML LL (SYRINGE) ×2 IMPLANT
TOWEL OR 17X24 6PK STRL BLUE (TOWEL DISPOSABLE) ×1 IMPLANT
TOWEL OR 17X26 10 PK STRL BLUE (TOWEL DISPOSABLE) ×2 IMPLANT

## 2016-08-21 NOTE — Anesthesia Preprocedure Evaluation (Signed)
Anesthesia Evaluation  Patient identified by MRN, date of birth, ID band Patient awake    Reviewed: Allergy & Precautions, H&P , NPO status , Patient's Chart, lab work & pertinent test results, reviewed documented beta blocker date and time   Airway Mallampati: II  TM Distance: >3 FB Neck ROM: Full    Dental no notable dental hx.    Pulmonary neg pulmonary ROS,    Pulmonary exam normal breath sounds clear to auscultation       Cardiovascular negative cardio ROS Normal cardiovascular exam Rhythm:Regular Rate:Normal     Neuro/Psych negative neurological ROS  negative psych ROS   GI/Hepatic negative GI ROS, Neg liver ROS,   Endo/Other  negative endocrine ROSdiabetes, Type 2Morbid obesity  Renal/GU negative Renal ROS  negative genitourinary   Musculoskeletal negative musculoskeletal ROS (+)   Abdominal   Peds negative pediatric ROS (+)  Hematology negative hematology ROS (+)   Anesthesia Other Findings   Reproductive/Obstetrics negative OB ROS                             Anesthesia Physical  Anesthesia Plan  ASA: III  Anesthesia Plan: General   Post-op Pain Management:  Regional for Post-op pain   Induction: Intravenous  PONV Risk Score and Plan: 2 and Ondansetron, Dexamethasone and Propofol  Airway Management Planned: LMA  Additional Equipment:   Intra-op Plan:   Post-operative Plan: Extubation in OR  Informed Consent: I have reviewed the patients History and Physical, chart, labs and discussed the procedure including the risks, benefits and alternatives for the proposed anesthesia with the patient or authorized representative who has indicated his/her understanding and acceptance.   Dental advisory given  Plan Discussed with: CRNA and Surgeon  Anesthesia Plan Comments: (  )        Anesthesia Quick Evaluation                                   Anesthesia  Evaluation  Patient identified by MRN, date of birth, ID band Patient awake    Reviewed: Allergy & Precautions, H&P , NPO status , Patient's Chart, lab work & pertinent test results, reviewed documented beta blocker date and time   Airway Mallampati: II  TM Distance: >3 FB Neck ROM: Full    Dental no notable dental hx.    Pulmonary neg pulmonary ROS,    Pulmonary exam normal breath sounds clear to auscultation       Cardiovascular negative cardio ROS Normal cardiovascular exam Rhythm:Regular Rate:Normal     Neuro/Psych negative neurological ROS  negative psych ROS   GI/Hepatic negative GI ROS, Neg liver ROS,   Endo/Other  diabetes, Type 2Morbid obesity  Renal/GU negative Renal ROS  negative genitourinary   Musculoskeletal negative musculoskeletal ROS (+)   Abdominal   Peds negative pediatric ROS (+)  Hematology negative hematology ROS (+)   Anesthesia Other Findings   Reproductive/Obstetrics negative OB ROS                             Anesthesia Physical  Anesthesia Plan  ASA: III  Anesthesia Plan: General   Post-op Pain Management:  Regional for Post-op pain   Induction: Intravenous  PONV Risk Score and Plan: 2 and Ondansetron, Dexamethasone and Propofol  Airway Management Planned: Oral ETT  Additional Equipment:  Intra-op Plan:   Post-operative Plan: Extubation in OR  Informed Consent: I have reviewed the patients History and Physical, chart, labs and discussed the procedure including the risks, benefits and alternatives for the proposed anesthesia with the patient or authorized representative who has indicated his/her understanding and acceptance.   Dental advisory given  Plan Discussed with: CRNA and Surgeon  Anesthesia Plan Comments: (  )       Anesthesia Quick Evaluation

## 2016-08-21 NOTE — Op Note (Signed)
08/21/2016  4:31 PM  PATIENT:  [name] [name]  51 y.o. female  PRE-OPERATIVE DIAGNOSIS:  BREAST CANCER  POST-OPERATIVE DIAGNOSIS:  BREAST CANCER  PROCEDURE:  Procedure(s): INSERTION PORT-A-CATH  SURGEON:  Surgeon(s) and Role:    * Jovita Kussmaul, MD - Primary  PHYSICIAN ASSISTANT:   ASSISTANTS: none   ANESTHESIA:   local and general  EBL:  Total I/O In: -  Out: 5 [Blood:5]  BLOOD ADMINISTERED:none  DRAINS: none   LOCAL MEDICATIONS USED:  MARCAINE     SPECIMEN:  No Specimen  DISPOSITION OF SPECIMEN:  N/A  COUNTS:  YES  TOURNIQUET:  * No tourniquets in log *  DICTATION: .Dragon Dictation   After informed consent was obtained the patient was brought to the operating room and placed in the supine position on the operating table. A roll was placed between the patient's shoulder blades to extend the shoulder slightly. After adequate induction of general anesthesia the patient's bilateral chest and neck area were prepped with ChloraPrep, allowed to dry, and draped in usual sterile manner. An appropriate timeout was performed. The patient was then placed in Trendelenburg position. The area lateral to the bend of the clavicle on the left chest [name] [name] was infiltrated with quarter percent Marcaine. A large-bore needle from the Port-A-Cath kit was used to slide beneath the bend of the clavicle on the left chest [name] [name] heading towards the sternal notch and in doing so I was able to access the left subclavian vein. A wire was fed through the needle using the Seldinger technique without difficulty. The wire was confirmed in the central venous system using real-time fluoroscopy. Next a small incision was made on the left chest [name] [name] at the wire entry site with a 15 blade knife. The incision was carried through the skin and subcutaneous tissue sharply with the electrocautery. A subcutaneous pocket was created inferior to the incision using blunt finger dissection and some sharp dissection with  electrocautery. Next the tubing was placed on the reservoir and the reservoir was placed in the pocket. The length of the tubing was estimated using real-time fluoroscopy. The tubing was then cut to the appropriate length. Next a sheath and dilator were fed over the wire also using the Seldinger technique without difficulty. The wire and dilator were removed. The tubing was fed through the sheath as far as it would go and then held in place while the sheath was gently cracked and separated. Another real-time fluoroscopy image showed the tip of the catheter to be in the distal superior vena cava. The tubing was then permanently anchored to the reservoir. The port was anchored in the pocket with 2 2-0 Prolene stitches. The port was then aspirated and it aspirated blood easily. The port was then flushed initially with a dilute heparin solution and then with a more concentrated heparin solution. The subcutaneous tissue was then closed over the port with interrupted 3-0 Vicryl stitches. Skin was then closed with a running 4-0 Monocryl subcuticular stitch. Dermabond dressings were applied. The patient tolerated the procedure well. At the end of the case all needle sponge and instrument counts were correct. The patient was then awakened and taken to recovery in stable condition.  PLAN OF CARE: Discharge to home after PACU  PATIENT DISPOSITION:  PACU - hemodynamically stable.   Delay start of Pharmacological VTE agent (>24hrs) due to surgical blood loss or risk of bleeding: not applicable

## 2016-08-21 NOTE — Anesthesia Procedure Notes (Signed)
Procedure Name: LMA Insertion Date/Time: 08/21/2016 3:53 PM Performed by: Merdis Delay Pre-anesthesia Checklist: Emergency Drugs available, Suction available, Patient being monitored, Timeout performed and Patient identified Patient Re-evaluated:Patient Re-evaluated prior to induction Oxygen Delivery Method: Circle system utilized Preoxygenation: Pre-oxygenation with 100% oxygen Induction Type: IV induction Ventilation: Mask ventilation without difficulty LMA: LMA inserted LMA Size: 4.0 Number of attempts: 1 Placement Confirmation: positive ETCO2 and breath sounds checked- equal and bilateral Tube secured with: Tape Dental Injury: Teeth and Oropharynx as per pre-operative assessment

## 2016-08-21 NOTE — Transfer of Care (Signed)
Immediate Anesthesia Transfer of Care Note  Patient: [name] [name]  Procedure(s) Performed: Procedure(s): INSERTION PORT-A-CATH WITH [place] (N/A)  Patient Location: PACU  Anesthesia Type:General  Level of Consciousness: drowsy  Airway & Oxygen Therapy: Patient Spontanous Breathing  Post-op Assessment: Report given to RN and Post -op Vital signs reviewed and stable  Post vital signs: Reviewed and stable  Last Vitals:  Vitals:   08/21/16 1435  BP: (!) 148/86  Pulse: 91  Resp: 18  Temp: 36.7 C    Last Pain:  Vitals:   08/21/16 1435  TempSrc: Oral  PainSc: 4          Complications: No apparent anesthesia complications

## 2016-08-21 NOTE — H&P (Signed)
  [name] [name]  Location: [facility] Patient #: [id number] DOB: 1965/05/12 Married / Language: English / Race: Black or African American Female   History of Present Illness  The patient is a 51 year old female who presents for a follow-up for Breast cancer. The patient is a 51 year old white female who is 2 years status post right lumpectomy and sentinel node mapping for a T1 cN0 right breast cancer. She was ER and PR positive and HER-2 negative with a Ki 67 of 60%. She is now about 2 weeks status post right prophylactic mastectomy and left mastectomy and sentinel node mapping for a T1 cN0 lobular left breast cancer. It was ER positive and PR negative and HER-2 negative with a Ki-67 of 20%. She tolerated the surgery well. She did begin the reconstruction process. She complains only of some mild pain at the operative site. Her margins were clean and her nodes were negative.   Allergies  Tetracycline *CHEMICALS*  Penicillin G Pot in Dextrose *PENICILLINS*   Medication History  Arimidex ('1MG'$  Tablet, Oral) Active. Illusions C Breast Prosthesis (1 (one) Misc as needed, Taken starting 07/16/2016) Active. (Mastectomy Products) Benzonatate ('100MG'$  Capsule, Oral) Active. Glimepiride ('4MG'$  Tablet, Oral) Active. Atorvastatin Calcium ('10MG'$  Tablet, Oral) Active. LevoFLOXacin ('750MG'$  Tablet, Oral) Active. MetFORMIN HCl ('500MG'$  Tablet, Oral every 4 to 6 hours as needed for pain) Active. Medications Reconciled  Vitals  Weight: 245 lb Height: 67.5in Body Surface Area: 2.22 m Body Mass Index: 37.81 kg/m  Temp.: 69F(Oral)  Pulse: 92 (Regular)  BP: 122/76 (Sitting, Left Wrist, Standard)       Physical Exam  Breast Note: Both mastectomy incisions are healing nicely with no sign of infection or seroma. The skin flaps are healthy.     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 She had reconstruction done by Dr. Marla Roe. She has tolerated the surgery well. At this point she will continue to follow-up with plastics. I will plan to see her back in about 3 months to check her progress. Current Plans Follow up with [place] in the office in 3 MONTHS.  Call us sooner as needed.  She is planning for chemo. Will place port today. Risks and benefits of the surgery as well as some of the technical aspects discussed with the patient and she understands and wishes to proceed

## 2016-08-21 NOTE — Interval H&P Note (Signed)
History and Physical Interval Note:  08/21/2016 3:02 PM  [name] [name]  has presented today for surgery, with the diagnosis of BREAST CANCER  The various methods of treatment have been discussed with the patient and family. After consideration of risks, benefits and other options for treatment, the patient has consented to  Procedure(s): INSERTION PORT-A-CATH WITH [place] (N/A) as a surgical intervention .  The patient's history has been reviewed, patient examined, no change in status, stable for surgery.  I have reviewed the patient's chart and labs.  Questions were answered to the patient's satisfaction.     TOTH III,Emaree Chiu S

## 2016-08-21 NOTE — Progress Notes (Signed)
After crackers/soda, asked to go to [facility], taken there, then to p2 for d/c/cxr

## 2016-08-22 ENCOUNTER — Encounter (HOSPITAL_COMMUNITY): Payer: Self-pay | Admitting: General Surgery

## 2016-08-22 NOTE — Anesthesia Postprocedure Evaluation (Signed)
Anesthesia Post Note  Patient: [name] [name]  Procedure(s) Performed: Procedure(s) (LRB): INSERTION PORT-A-CATH WITH [place] (N/A)     Patient location during evaluation: PACU Anesthesia Type: General Level of consciousness: awake and alert Pain management: pain level controlled Vital Signs Assessment: post-procedure vital signs reviewed and stable Respiratory status: spontaneous breathing, nonlabored ventilation, respiratory function stable and patient connected to nasal cannula oxygen Cardiovascular status: blood pressure returned to baseline and stable Postop Assessment: no signs of nausea or vomiting Anesthetic complications: no    Last Vitals:  Vitals:   08/21/16 1645 08/21/16 1700  BP:  (!) 168/89  Pulse:  96  Resp:  18  Temp: (!) 36.3 C     Last Pain:  Vitals:   08/21/16 1700  TempSrc:   PainSc: Asleep                 Anyra Kaufman

## 2016-08-25 ENCOUNTER — Other Ambulatory Visit: Payer: 59

## 2016-08-25 ENCOUNTER — Ambulatory Visit (HOSPITAL_BASED_OUTPATIENT_CLINIC_OR_DEPARTMENT_OTHER): Payer: 59 | Admitting: Hematology and Oncology

## 2016-08-25 ENCOUNTER — Encounter: Payer: Self-pay | Admitting: Hematology and Oncology

## 2016-08-25 ENCOUNTER — Other Ambulatory Visit (HOSPITAL_BASED_OUTPATIENT_CLINIC_OR_DEPARTMENT_OTHER): Payer: 59

## 2016-08-25 ENCOUNTER — Ambulatory Visit (HOSPITAL_BASED_OUTPATIENT_CLINIC_OR_DEPARTMENT_OTHER): Payer: 59

## 2016-08-25 ENCOUNTER — Ambulatory Visit: Payer: 59

## 2016-08-25 VITALS — BP 192/96 | HR 79 | Temp 98.7°F | Resp 18

## 2016-08-25 DIAGNOSIS — C50411 Malignant neoplasm of upper-outer quadrant of right female breast: Secondary | ICD-10-CM

## 2016-08-25 DIAGNOSIS — Z5189 Encounter for other specified aftercare: Secondary | ICD-10-CM

## 2016-08-25 DIAGNOSIS — C50212 Malignant neoplasm of upper-inner quadrant of left female breast: Secondary | ICD-10-CM

## 2016-08-25 DIAGNOSIS — Z170 Estrogen receptor positive status [ER+]: Principal | ICD-10-CM

## 2016-08-25 DIAGNOSIS — Z95828 Presence of other vascular implants and grafts: Secondary | ICD-10-CM

## 2016-08-25 DIAGNOSIS — Z5111 Encounter for antineoplastic chemotherapy: Secondary | ICD-10-CM

## 2016-08-25 LAB — COMPREHENSIVE METABOLIC PANEL
ALT: 11 U/L (ref 0–55)
ANION GAP: 10 meq/L (ref 3–11)
AST: 7 U/L (ref 5–34)
Albumin: 3.3 g/dL — ABNORMAL LOW (ref 3.5–5.0)
Alkaline Phosphatase: 68 U/L (ref 40–150)
BUN: 11.7 mg/dL (ref 7.0–26.0)
CHLORIDE: 104 meq/L (ref 98–109)
CO2: 25 meq/L (ref 22–29)
Calcium: 9.2 mg/dL (ref 8.4–10.4)
Creatinine: 0.9 mg/dL (ref 0.6–1.1)
EGFR: 84 mL/min/{1.73_m2} — AB (ref >90)
Glucose: 217 mg/dl — ABNORMAL HIGH (ref 70–140)
POTASSIUM: 4.1 meq/L (ref 3.5–5.1)
Sodium: 139 mEq/L (ref 136–145)
Total Bilirubin: 0.29 mg/dL (ref 0.20–1.20)
Total Protein: 7.1 g/dL (ref 6.4–8.3)

## 2016-08-25 LAB — CBC WITH DIFFERENTIAL/PLATELET
BASO%: 0.9 % (ref 0.0–2.0)
BASOS ABS: 0.1 10*3/uL (ref 0.0–0.1)
EOS%: 0.3 % (ref 0.0–7.0)
Eosinophils Absolute: 0 10*3/uL (ref 0.0–0.5)
HCT: 30.1 % — ABNORMAL LOW (ref 34.8–46.6)
HGB: 9.7 g/dL — ABNORMAL LOW (ref 11.6–15.9)
LYMPH%: 14 % (ref 14.0–49.7)
MCH: 26.1 pg (ref 25.1–34.0)
MCHC: 32.4 g/dL (ref 31.5–36.0)
MCV: 80.7 fL (ref 79.5–101.0)
MONO#: 0.4 10*3/uL (ref 0.1–0.9)
MONO%: 6.7 % (ref 0.0–14.0)
NEUT#: 5.2 10*3/uL (ref 1.5–6.5)
NEUT%: 78.1 % — AB (ref 38.4–76.8)
PLATELETS: 340 10*3/uL (ref 145–400)
RBC: 3.73 10*6/uL (ref 3.70–5.45)
RDW: 15.9 % — ABNORMAL HIGH (ref 11.2–14.5)
WBC: 6.6 10*3/uL (ref 3.9–10.3)
lymph#: 0.9 10*3/uL (ref 0.9–3.3)

## 2016-08-25 MED ORDER — PEGFILGRASTIM 6 MG/0.6ML ~~LOC~~ PSKT
6.0000 mg | PREFILLED_SYRINGE | Freq: Once | SUBCUTANEOUS | Status: AC
  Administered 2016-08-25: 6 mg via SUBCUTANEOUS
  Filled 2016-08-25: qty 0.6

## 2016-08-25 MED ORDER — SODIUM CHLORIDE 0.9 % IV SOLN
75.0000 mg/m2 | Freq: Once | INTRAVENOUS | Status: AC
  Administered 2016-08-25: 170 mg via INTRAVENOUS
  Filled 2016-08-25: qty 17

## 2016-08-25 MED ORDER — LORAZEPAM 2 MG/ML IJ SOLN
INTRAMUSCULAR | Status: AC
  Filled 2016-08-25: qty 1

## 2016-08-25 MED ORDER — CLONIDINE HCL 0.1 MG PO TABS
ORAL_TABLET | ORAL | Status: AC
  Filled 2016-08-25: qty 1

## 2016-08-25 MED ORDER — SODIUM CHLORIDE 0.9% FLUSH
10.0000 mL | INTRAVENOUS | Status: DC | PRN
  Administered 2016-08-25: 10 mL via INTRAVENOUS
  Filled 2016-08-25: qty 10

## 2016-08-25 MED ORDER — DEXAMETHASONE SODIUM PHOSPHATE 10 MG/ML IJ SOLN
10.0000 mg | Freq: Once | INTRAMUSCULAR | Status: AC
  Administered 2016-08-25: 10 mg via INTRAVENOUS

## 2016-08-25 MED ORDER — PALONOSETRON HCL INJECTION 0.25 MG/5ML
INTRAVENOUS | Status: AC
  Filled 2016-08-25: qty 5

## 2016-08-25 MED ORDER — SODIUM CHLORIDE 0.9 % IV SOLN
Freq: Once | INTRAVENOUS | Status: AC
  Administered 2016-08-25: 14:00:00 via INTRAVENOUS

## 2016-08-25 MED ORDER — LORAZEPAM 2 MG/ML IJ SOLN
0.5000 mg | Freq: Once | INTRAMUSCULAR | Status: AC
  Administered 2016-08-25: 0.5 mg via INTRAVENOUS

## 2016-08-25 MED ORDER — PALONOSETRON HCL INJECTION 0.25 MG/5ML
0.2500 mg | Freq: Once | INTRAVENOUS | Status: AC
  Administered 2016-08-25: 0.25 mg via INTRAVENOUS

## 2016-08-25 MED ORDER — CLONIDINE HCL 0.1 MG PO TABS
0.1000 mg | ORAL_TABLET | Freq: Once | ORAL | Status: AC | PRN
  Administered 2016-08-25: 0.1 mg via ORAL

## 2016-08-25 MED ORDER — DEXAMETHASONE SODIUM PHOSPHATE 10 MG/ML IJ SOLN
INTRAMUSCULAR | Status: AC
  Filled 2016-08-25: qty 1

## 2016-08-25 MED ORDER — CYCLOPHOSPHAMIDE CHEMO INJECTION 1 GM
600.0000 mg/m2 | Freq: Once | INTRAMUSCULAR | Status: AC
  Administered 2016-08-25: 1380 mg via INTRAVENOUS
  Filled 2016-08-25: qty 69

## 2016-08-25 MED ORDER — HEPARIN SOD (PORK) LOCK FLUSH 100 UNIT/ML IV SOLN
500.0000 [IU] | Freq: Once | INTRAVENOUS | Status: AC | PRN
  Administered 2016-08-25: 500 [IU]
  Filled 2016-08-25: qty 5

## 2016-08-25 MED ORDER — CLONIDINE HCL 0.1 MG PO TABS
0.1000 mg | ORAL_TABLET | Freq: Once | ORAL | Status: AC
Start: 2016-08-25 — End: 2016-08-25
  Administered 2016-08-25: 0.1 mg via ORAL

## 2016-08-25 MED ORDER — SODIUM CHLORIDE 0.9% FLUSH
10.0000 mL | INTRAVENOUS | Status: DC | PRN
  Administered 2016-08-25: 10 mL
  Filled 2016-08-25: qty 10

## 2016-08-25 NOTE — Progress Notes (Signed)
Received call from infusion room regarding pt increased BP to 170's/80's to 190's/90's over 15 minutes of initiating taxotere. Pt denies any other symptoms and is not currently being treated for hypertension. Infusion RN states that pt may have a lot of anxiety at this time with treatment. Taxotere on hold until further instructions from [place]. Obtained order for lorazepam for anxiety and clonidine as needed for increased bp. May restart once bp start to decline per Dr.Gudena.

## 2016-08-25 NOTE — Addendum Note (Signed)
Addended by: Thelma Barge MAY J on: 08/25/2016 02:50 PM   Modules accepted: Orders

## 2016-08-25 NOTE — Assessment & Plan Note (Signed)
Bilateral mastectomies 07/24/2016  Left mastectomy: ILC grade 2, 1.4 cm, margins negative, 0/2 lymph nodes negative, T1 CN 0 stage IA, ER 95%, PR 0%, HER-2 negative, Ki-67 20% Right mastectomy: Benign  Oncotype DX score 26:17% risk of distant recurrence  Recommendation: 1. Adjuvant chemotherapy with Taxotere and Cytoxan every 3 weeks 4 cycles 2. followed by anastrozole 1 mg daily with ovarian suppression (labs on 08/04/2016 showed Abercrombie 21.7 and estradiol less than 2.5)  -------------------------------------------------------------------------- Current treatment: Cycle 1 day 1 Taxotere and Cytoxan  Chemotherapy class completed Antiemetics were reviewed Chemotherapy consent obtained Return to clinic in one week for toxicity check

## 2016-08-25 NOTE — Progress Notes (Signed)
Patient Care Team: [name] as PCP - General (Family Medicine) Jovita Kussmaul, MD as Consulting Physician (General Surgery) Nicholas Lose, MD as Consulting Physician (Hematology and Oncology) Gery Pray, MD as Consulting Physician (Radiation Oncology) Rockwell Germany, RN as Registered Nurse Mauro Kaufmann, RN as Registered Nurse Holley Bouche, NP as Nurse Practitioner (Nurse Practitioner) Sylvan Cheese, NP as Nurse Practitioner (Nurse Practitioner)  DIAGNOSIS:  Encounter Diagnosis  Name Primary?  . 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; ~ 3 cm AP diameter linear clumped NME extends from the medial biopsy cavity posteriorly in the central breast which is suspicious for disease extension      04/26/2014 Clinical Stage    Stage 0: Tis Nx      06/05/2014 Definitive Surgery    Right lumpectomy [name]): ILC, grade 2/3, 0.6 cm, LCIS invasive ca focally present at inferior margin (rt Medial); ILC, grade 2-3, 1.7 cm with neg margins with LCIS (Rt Lateral) ER 100%, PR 99%, Ki 67 6%, HER2/neu neg (ratio 1.14)      06/05/2014 Oncotype testing    Score: 8 (ROR 6%)      06/05/2014 Pathologic Stage    Stage IA: mpT1c pNx      06/23/2014 Surgery    Right breast excision: Benign, 0/3 lymph nodes negative      07/24/2014 - 09/08/2014 Radiation Therapy    Adjuvant RT completed [name]): Right breast 45 Gy over  25 fractions.  Right breast boost 16 Gy over 8 fractions.  Total dose: 60 Gy.        09/25/2014 -  Anti-estrogen oral therapy    Tamoxifen 20 mg daily (Tashea Othman).  Planned duration of therapy 5-10 years.      11/02/2014 Procedure    Breast/Ovarian panel revealed no clinically significant variant at ATM, BARD1, BRCA1, BRCA2, BRIP1, CDH1, CHEK2, FANCC, MLH1, MSH2, MSH6, NBN, PALB2, PMS2, PTEN, RAD51C, RAD51D, TP53, and XRCC2      11/03/2014 Survivorship    Survivorship visit completed and copy of survivorship care plan provided to patient.      05/07/2016 Breast MRI    Left breast non-mass enhancement spanning 6.9 cm. Small enhancing mass 5.6 cm, no lymph nodes. Right breast normal      05/20/2016 Pathology Results    Left breast biopsy posterior outer: Invasive lobular cancer with LCIS, ER 95%, PR 0%, Ki-67 20%, HER-2 negative ratio 1.25      07/24/2016 Surgery    Left mastectomy: ILC grade 2, 1.4 cm, margins negative, 0/2 lymph nodes negative, T1 CN 0 stage IA, ER 95%, PR 0%, HER-2 negative, Ki-67 20% Right mastectomy: Benign      08/11/2016 Oncotype testing    Oncotype score 26:17% risk of recurrence, high intermediate risk      08/25/2016 -  Chemotherapy    Taxotere and Cytoxan 4 cycles        CHIEF COMPLIANT:  Cycle 1 day 1 Taxotere and Cytoxan  INTERVAL HISTORY: Linda Grimmer is a 51 year old with above-mentioned history of left breast cancer who is currently on adjuvant chemotherapy and today's cycle 1 of treatment. She had port placement last week and a slightly sore from that. The drains of come out and she has healed well from the mastectomies.  REVIEW OF SYSTEMS:   Constitutional: Denies fevers, chills or abnormal weight loss Eyes: Denies blurriness of vision Ears, nose, mouth, throat, and face: Denies mucositis or sore throat Respiratory: Denies cough, dyspnea or wheezes Cardiovascular: Denies palpitation, chest discomfort Gastrointestinal:  Denies nausea, heartburn or  change in bowel habits Skin: Denies abnormal skin rashes Lymphatics: Denies new lymphadenopathy or easy bruising Neurological:Denies numbness, tingling or new weaknesses Behavioral/Psych: Mood is stable, no new changes  Extremities: No lower extremity edema Breast: Bilateral mastectomies All other systems were reviewed with the patient and are negative.  I have reviewed the past medical history, past surgical history, social history and family history with the patient and they are unchanged from previous note.  ALLERGIES:  is allergic to penicillins; shellfish allergy; and tetracyclines & related.  MEDICATIONS:  Current Outpatient Prescriptions  Medication Sig Dispense Refill  . atorvastatin (LIPITOR) 10 MG tablet Take 1 tablet (10 mg total) by mouth daily.    Marland Kitchen dexamethasone (DECADRON) 4 MG tablet Take 1 tablet (4 mg total) by mouth daily. Start the day before Taxotere. Then again the day after chemo 30 tablet 1  . diazepam (VALIUM) 2 MG tablet Take 2 mg by mouth every 8 (eight) hours as needed for muscle spasms.  0  . glimepiride (AMARYL) 4 MG tablet Take 1 tablet (4 mg total) by mouth daily with breakfast.    . lidocaine-prilocaine (EMLA) cream Apply to affected area once 30 g 3  . metFORMIN (GLUCOPHAGE) 1000 MG tablet Take 1,000 mg by mouth 2 (two) times daily with a meal.    . ondansetron (ZOFRAN) 8 MG tablet Take 1 tablet (8 mg total) by mouth 2 (two) times daily as needed for refractory nausea / vomiting. Start on day 3 after chemo. 30 tablet 1  . prochlorperazine (COMPAZINE) 10 MG tablet Take 1 tablet (10 mg total) by mouth every 6 (six) hours as needed (Nausea or vomiting). 30 tablet 1  . traMADol (ULTRAM) 50 MG tablet Take 50 mg by mouth every 6 (six) hours as needed for pain.  0   No current facility-administered medications for this visit.     PHYSICAL EXAMINATION: ECOG PERFORMANCE STATUS: 1 - Symptomatic but completely ambulatory  Vitals:   08/25/16 1154  BP: (!)  171/101  Pulse: 87  Resp: 18  Temp: (!) 97.4 F (36.3 C)   Filed Weights   08/25/16 1154  Weight: 239 lb (108.4 kg)    GENERAL:alert, no distress and comfortable SKIN: skin color, texture, turgor are normal, no rashes or significant lesions EYES: normal, Conjunctiva are pink and non-injected, sclera clear OROPHARYNX:no exudate, no erythema and lips, buccal mucosa, and tongue normal  NECK: supple, thyroid normal size, non-tender, without nodularity LYMPH:  no palpable lymphadenopathy in the cervical, axillary or inguinal LUNGS: clear to auscultation and percussion with normal breathing effort HEART: regular rate & rhythm and no murmurs and no lower extremity edema ABDOMEN:abdomen soft, non-tender and normal bowel sounds MUSCULOSKELETAL:no cyanosis of digits and no clubbing  NEURO: alert & oriented x 3 with fluent speech, no focal motor/sensory deficits EXTREMITIES: No lower extremity edema  LABORATORY DATA:  I have reviewed  the data as listed   Chemistry      Component Value Date/Time   NA 139 08/25/2016 1127   K 4.1 08/25/2016 1127   CL 103 08/19/2016 1351   CO2 25 08/25/2016 1127   BUN 11.7 08/25/2016 1127   CREATININE 0.9 08/25/2016 1127      Component Value Date/Time   CALCIUM 9.2 08/25/2016 1127   ALKPHOS 68 08/25/2016 1127   AST 7 08/25/2016 1127   ALT 11 08/25/2016 1127   BILITOT 0.29 08/25/2016 1127       Lab Results  Component Value Date   WBC 6.6 08/25/2016   HGB 9.7 (L) 08/25/2016   HCT 30.1 (L) 08/25/2016   MCV 80.7 08/25/2016   PLT 340 08/25/2016   NEUTROABS 5.2 08/25/2016    ASSESSMENT & PLAN:  Breast cancer of upper-outer quadrant of right female breast Bilateral mastectomies 07/24/2016  Left mastectomy: ILC grade 2, 1.4 cm, margins negative, 0/2 lymph nodes negative, T1 CN 0 stage IA, ER 95%, PR 0%, HER-2 negative, Ki-67 20% Right mastectomy: Benign  Oncotype DX score 26:17% risk of distant recurrence  Recommendation: 1. Adjuvant  chemotherapy with Taxotere and Cytoxan every 3 weeks 4 cycles 2. followed by anastrozole 1 mg daily with ovarian suppression (labs on 08/04/2016 showed Malo 21.7 and estradiol less than 2.5)  -------------------------------------------------------------------------- Current treatment: Cycle 1 day 1 Taxotere and Cytoxan  Chemotherapy class completed Antiemetics were reviewed Chemotherapy consent obtained Return to clinic in one week for toxicity check Anemia: Normocytic in nature probably related to prior surgery. Rule out to watch and monitor it. I instructed her to return protein-containing diet. We will have to watch her blood sugars start treatment because of diabetes.  I spent 25 minutes talking to the patient of which more than half was spent in counseling and coordination of care.  No orders of the defined types were placed in this encounter.  The patient has a good understanding of the overall plan. she agrees with it. she will call with any problems that may develop before the next visit here.   Rulon Eisenmenger, MD 08/25/16

## 2016-08-25 NOTE — Progress Notes (Signed)
During 1st 15 minutes of infusion, patient's blood pressure increased gradually (see flowsheet data). The patient does state that she is anxious. No signs or symptoms of infusion related reaction. Contacted Dr. Lindi Adie and we will give ativan 0.5 mg and if her hypertension doesn't resolve, we can give her clonidine. Taxotere will be resumed.  BP remains elevated after ativan administration. Giving clonidine 0.1 mg.  BP remains elevated 45 minutes after clonidine administration. Dr. Lindi Adie contacted. Will administer a second dose of 0.1mg  clonidine and recheck before discharge.

## 2016-08-25 NOTE — Patient Instructions (Signed)
[facility] Discharge Instructions for Patients Receiving Chemotherapy  Today you received the following chemotherapy agents: Taxotere and Cytoxan.  To help prevent nausea and vomiting after your treatment, we encourage you to take your nausea medication as directed.   If you develop nausea and vomiting that is not controlled by your nausea medication, call the clinic.   BELOW ARE SYMPTOMS THAT SHOULD BE REPORTED IMMEDIATELY:  *FEVER GREATER THAN 100.5 F  *CHILLS WITH OR WITHOUT FEVER  NAUSEA AND VOMITING THAT IS NOT CONTROLLED WITH YOUR NAUSEA MEDICATION  *UNUSUAL SHORTNESS OF BREATH  *UNUSUAL BRUISING OR BLEEDING  TENDERNESS IN MOUTH AND THROAT WITH OR WITHOUT PRESENCE OF ULCERS  *URINARY PROBLEMS  *BOWEL PROBLEMS  UNUSUAL RASH Items with * indicate a potential emergency and should be followed up as soon as possible.  Feel free to call the clinic you have any questions or concerns. The clinic phone number is [phone].  Please show the [facility] at check-in to the Emergency Department and triage nurse.  Docetaxel (Taxotere) injection What is this medicine? DOCETAXEL (doe se TAX el) is a chemotherapy drug. It targets fast dividing cells, like cancer cells, and causes these cells to die. This medicine is used to treat many types of cancers like breast cancer, certain stomach cancers, head and neck cancer, lung cancer, and prostate cancer. This medicine may be used for other purposes; ask your health care provider or pharmacist if you have questions. COMMON BRAND NAME(S): Docefrez, Taxotere What should I tell my health care provider before I take this medicine? They need to know if you have any of these conditions: -infection (especially a virus infection such as chickenpox, cold sores, or herpes) -liver disease -low blood counts, like low white cell, platelet, or red cell counts -an unusual or allergic reaction to docetaxel, polysorbate 80, other  chemotherapy agents, other medicines, foods, dyes, or preservatives -pregnant or trying to get pregnant -breast-feeding How should I use this medicine? This drug is given as an infusion into a vein. It is administered in a hospital or clinic by a specially trained health care professional. Talk to your pediatrician regarding the use of this medicine in children. Special care may be needed. Overdosage: If you think you have taken too much of this medicine contact a poison control center or emergency room at once. NOTE: This medicine is only for you. Do not share this medicine with others. What if I miss a dose? It is important not to miss your dose. Call your doctor or health care professional if you are unable to keep an appointment. What may interact with this medicine? -cyclosporine -erythromycin -ketoconazole -medicines to increase blood counts like filgrastim, pegfilgrastim, sargramostim -vaccines Talk to your doctor or health care professional before taking any of these medicines: -acetaminophen -aspirin -ibuprofen -ketoprofen -naproxen This list may not describe all possible interactions. Give your health care provider a list of all the medicines, herbs, non-prescription drugs, or dietary supplements you use. Also tell them if you smoke, drink alcohol, or use illegal drugs. Some items may interact with your medicine. What should I watch for while using this medicine? Your condition will be monitored carefully while you are receiving this medicine. You will need important blood work done while you are taking this medicine. This drug may make you feel generally unwell. This is not uncommon, as chemotherapy can affect healthy cells as well as cancer cells. Report any side effects. Continue your course of treatment even though you feel  ill unless your doctor tells you to stop. In some cases, you may be given additional medicines to help with side effects. Follow all directions for their  use. Call your doctor or health care professional for advice if you get a fever, chills or sore throat, or other symptoms of a cold or flu. Do not treat yourself. This drug decreases your body's ability to fight infections. Try to avoid being around people who are sick. This medicine may increase your risk to bruise or bleed. Call your doctor or health care professional if you notice any unusual bleeding. This medicine may contain alcohol in the product. You may get drowsy or dizzy. Do not drive, use machinery, or do anything that needs mental alertness until you know how this medicine affects you. Do not stand or sit up quickly, especially if you are an older patient. This reduces the risk of dizzy or fainting spells. Avoid alcoholic drinks. Do not become pregnant while taking this medicine. Women should inform their doctor if they wish to become pregnant or think they might be pregnant. There is a potential for serious side effects to an unborn child. Talk to your health care professional or pharmacist for more information. Do not breast-feed an infant while taking this medicine. What side effects may I notice from receiving this medicine? Side effects that you should report to your doctor or health care professional as soon as possible: -allergic reactions like skin rash, itching or hives, swelling of the face, lips, or tongue -low blood counts - This drug may decrease the number of white blood cells, red blood cells and platelets. You may be at increased risk for infections and bleeding. -signs of infection - fever or chills, cough, sore throat, pain or difficulty passing urine -signs of decreased platelets or bleeding - bruising, pinpoint red spots on the skin, black, tarry stools, nosebleeds -signs of decreased red blood cells - unusually weak or tired, fainting spells, lightheadedness -breathing problems -fast or irregular heartbeat -low blood pressure -mouth sores -nausea and vomiting -pain,  swelling, redness or irritation at the injection site -pain, tingling, numbness in the hands or feet -swelling of the ankle, feet, hands -weight gain Side effects that usually do not require medical attention (report to your doctor or health care professional if they continue or are bothersome): -bone pain -complete hair loss including hair on your head, underarms, pubic hair, eyebrows, and eyelashes -diarrhea -excessive tearing -changes in the color of fingernails -loosening of the fingernails -nausea -muscle pain -red flush to skin -sweating -weak or tired This list may not describe all possible side effects. Call your doctor for medical advice about side effects. You may report side effects to FDA at 1-800-FDA-1088. Where should I keep my medicine? This drug is given in a hospital or clinic and will not be stored at home. NOTE: This sheet is a summary. It may not cover all possible information. If you have questions about this medicine, talk to your doctor, pharmacist, or health care provider.  2018 Elsevier/Gold Standard (2015-03-01 12:32:56)  Cyclophosphamide (Cytoxan) injection What is this medicine? CYCLOPHOSPHAMIDE (sye kloe FOSS fa mide) is a chemotherapy drug. It slows the growth of cancer cells. This medicine is used to treat many types of cancer like lymphoma, myeloma, leukemia, breast cancer, and ovarian cancer, to name a few. This medicine may be used for other purposes; ask your health care provider or pharmacist if you have questions. COMMON BRAND NAME(S): Cytoxan, Neosar What should I tell my health  care provider before I take this medicine? They need to know if you have any of these conditions: -blood disorders -history of other chemotherapy -infection -kidney disease -liver disease -recent or ongoing radiation therapy -tumors in the bone marrow -an unusual or allergic reaction to cyclophosphamide, other chemotherapy, other medicines, foods, dyes, or  preservatives -pregnant or trying to get pregnant -breast-feeding How should I use this medicine? This drug is usually given as an injection into a vein or muscle or by infusion into a vein. It is administered in a hospital or clinic by a specially trained health care professional. Talk to your pediatrician regarding the use of this medicine in children. Special care may be needed. Overdosage: If you think you have taken too much of this medicine contact a poison control center or emergency room at once. NOTE: This medicine is only for you. Do not share this medicine with others. What if I miss a dose? It is important not to miss your dose. Call your doctor or health care professional if you are unable to keep an appointment. What may interact with this medicine? This medicine may interact with the following medications: -amiodarone -amphotericin B -azathioprine -certain antiviral medicines for HIV or AIDS such as protease inhibitors (e.g., indinavir, ritonavir) and zidovudine -certain blood pressure medications such as benazepril, captopril, enalapril, fosinopril, lisinopril, moexipril, monopril, perindopril, quinapril, ramipril, trandolapril -certain cancer medications such as anthracyclines (e.g., daunorubicin, doxorubicin), busulfan, cytarabine, paclitaxel, pentostatin, tamoxifen, trastuzumab -certain diuretics such as chlorothiazide, chlorthalidone, hydrochlorothiazide, indapamide, metolazone -certain medicines that treat or prevent blood clots like warfarin -certain muscle relaxants such as succinylcholine -cyclosporine -etanercept -indomethacin -medicines to increase blood counts like filgrastim, pegfilgrastim, sargramostim -medicines used as general anesthesia -metronidazole -natalizumab This list may not describe all possible interactions. Give your health care provider a list of all the medicines, herbs, non-prescription drugs, or dietary supplements you use. Also tell them if  you smoke, drink alcohol, or use illegal drugs. Some items may interact with your medicine. What should I watch for while using this medicine? Visit your doctor for checks on your progress. This drug may make you feel generally unwell. This is not uncommon, as chemotherapy can affect healthy cells as well as cancer cells. Report any side effects. Continue your course of treatment even though you feel ill unless your doctor tells you to stop. Drink water or other fluids as directed. Urinate often, even at night. In some cases, you may be given additional medicines to help with side effects. Follow all directions for their use. Call your doctor or health care professional for advice if you get a fever, chills or sore throat, or other symptoms of a cold or flu. Do not treat yourself. This drug decreases your body's ability to fight infections. Try to avoid being around people who are sick. This medicine may increase your risk to bruise or bleed. Call your doctor or health care professional if you notice any unusual bleeding. Be careful brushing and flossing your teeth or using a toothpick because you may get an infection or bleed more easily. If you have any dental work done, tell your dentist you are receiving this medicine. You may get drowsy or dizzy. Do not drive, use machinery, or do anything that needs mental alertness until you know how this medicine affects you. Do not become pregnant while taking this medicine or for 1 year after stopping it. Women should inform their doctor if they wish to become pregnant or think they might be pregnant. Men  should not father a child while taking this medicine and for 4 months after stopping it. There is a potential for serious side effects to an unborn child. Talk to your health care professional or pharmacist for more information. Do not breast-feed an infant while taking this medicine. This medicine may interfere with the ability to have a child. This medicine  has caused ovarian failure in some women. This medicine has caused reduced sperm counts in some men. You should talk with your doctor or health care professional if you are concerned about your fertility. If you are going to have surgery, tell your doctor or health care professional that you have taken this medicine. What side effects may I notice from receiving this medicine? Side effects that you should report to your doctor or health care professional as soon as possible: -allergic reactions like skin rash, itching or hives, swelling of the face, lips, or tongue -low blood counts - this medicine may decrease the number of white blood cells, red blood cells and platelets. You may be at increased risk for infections and bleeding. -signs of infection - fever or chills, cough, sore throat, pain or difficulty passing urine -signs of decreased platelets or bleeding - bruising, pinpoint red spots on the skin, black, tarry stools, blood in the urine -signs of decreased red blood cells - unusually weak or tired, fainting spells, lightheadedness -breathing problems -dark urine -dizziness -palpitations -swelling of the ankles, feet, hands -trouble passing urine or change in the amount of urine -weight gain -yellowing of the eyes or skin Side effects that usually do not require medical attention (report to your doctor or health care professional if they continue or are bothersome): -changes in nail or skin color -hair loss -missed menstrual periods -mouth sores -nausea, vomiting This list may not describe all possible side effects. Call your doctor for medical advice about side effects. You may report side effects to FDA at 1-800-FDA-1088. Where should I keep my medicine? This drug is given in a hospital or clinic and will not be stored at home. NOTE: This sheet is a summary. It may not cover all possible information. If you have questions about this medicine, talk to your doctor, pharmacist, or  health care provider.  2018 Elsevier/Gold Standard (2011-12-12 16:22:58)

## 2016-08-25 NOTE — Progress Notes (Signed)
VS taken 1735 BP 195/95. Dr. Lebron Conners at chairside, pt has quite a lot of anxiety. Observe x15-20 additional minutes retake BP and advise MD. BP 192/96, Dr. Lebron Conners spoke with pt and spouse regarding any additional signs or symptoms of chest pain difficulty breathing to go to ED immediately. Pt and or spouse is to contact PCP tomorrow to advise of elevated BP. Spouse and pt verbalized understanding. No further concerns.

## 2016-08-25 NOTE — Patient Instructions (Signed)
Implanted Port Home Guide An implanted port is a type of central line that is placed under the skin. Central lines are used to provide IV access when treatment or nutrition needs to be given through a person's veins. Implanted ports are used for long-term IV access. An implanted port may be placed because:  You need IV medicine that would be irritating to the small veins in your hands or arms.  You need long-term IV medicines, such as antibiotics.  You need IV nutrition for a long period.  You need frequent blood draws for lab tests.  You need dialysis.  Implanted ports are usually placed in the chest area, but they can also be placed in the upper arm, the abdomen, or the leg. An implanted port has two main parts:  Reservoir. The reservoir is round and will appear as a small, raised area under your skin. The reservoir is the part where a needle is inserted to give medicines or draw blood.  Catheter. The catheter is a thin, flexible tube that extends from the reservoir. The catheter is placed into a large vein. Medicine that is inserted into the reservoir goes into the catheter and then into the vein.  How will I care for my incision site? Do not get the incision site wet. Bathe or shower as directed by your health care provider. How is my port accessed? Special steps must be taken to access the port:  Before the port is accessed, a numbing cream can be placed on the skin. This helps numb the skin over the port site.  Your health care provider uses a sterile technique to access the port. ? Your health care provider must put on a mask and sterile gloves. ? The skin over your port is cleaned carefully with an antiseptic and allowed to dry. ? The port is gently pinched between sterile gloves, and a needle is inserted into the port.  Only "non-coring" port needles should be used to access the port. Once the port is accessed, a blood return should be checked. This helps ensure that the port  is in the vein and is not clogged.  If your port needs to remain accessed for a constant infusion, a clear (transparent) bandage will be placed over the needle site. The bandage and needle will need to be changed every week, or as directed by your health care provider.  Keep the bandage covering the needle clean and dry. Do not get it wet. Follow your health care provider's instructions on how to take a shower or bath while the port is accessed.  If your port does not need to stay accessed, no bandage is needed over the port.  What is flushing? Flushing helps keep the port from getting clogged. Follow your health care provider's instructions on how and when to flush the port. Ports are usually flushed with saline solution or a medicine called heparin. The need for flushing will depend on how the port is used.  If the port is used for intermittent medicines or blood draws, the port will need to be flushed: ? After medicines have been given. ? After blood has been drawn. ? As part of routine maintenance.  If a constant infusion is running, the port may not need to be flushed.  How long will my port stay implanted? The port can stay in for as long as your health care provider thinks it is needed. When it is time for the port to come out, surgery will be   done to remove it. The procedure is similar to the one performed when the port was put in. When should I seek immediate medical care? When you have an implanted port, you should seek immediate medical care if:  You notice a bad smell coming from the incision site.  You have swelling, redness, or drainage at the incision site.  You have more swelling or pain at the port site or the surrounding area.  You have a fever that is not controlled with medicine.  This information is not intended to replace advice given to you by your health care provider. Make sure you discuss any questions you have with your health care provider. Document  Released: 01/27/2005 Document Revised: 07/05/2015 Document Reviewed: 10/04/2012 Elsevier Interactive Patient Education  2017 [employer]  

## 2016-09-01 ENCOUNTER — Other Ambulatory Visit (HOSPITAL_BASED_OUTPATIENT_CLINIC_OR_DEPARTMENT_OTHER): Payer: 59

## 2016-09-01 ENCOUNTER — Encounter: Payer: Self-pay | Admitting: Hematology and Oncology

## 2016-09-01 ENCOUNTER — Ambulatory Visit (HOSPITAL_BASED_OUTPATIENT_CLINIC_OR_DEPARTMENT_OTHER): Payer: 59 | Admitting: Hematology and Oncology

## 2016-09-01 ENCOUNTER — Ambulatory Visit (HOSPITAL_BASED_OUTPATIENT_CLINIC_OR_DEPARTMENT_OTHER): Payer: 59

## 2016-09-01 DIAGNOSIS — C50912 Malignant neoplasm of unspecified site of left female breast: Secondary | ICD-10-CM

## 2016-09-01 DIAGNOSIS — Z95828 Presence of other vascular implants and grafts: Secondary | ICD-10-CM | POA: Insufficient documentation

## 2016-09-01 DIAGNOSIS — Z170 Estrogen receptor positive status [ER+]: Secondary | ICD-10-CM

## 2016-09-01 DIAGNOSIS — Z452 Encounter for adjustment and management of vascular access device: Secondary | ICD-10-CM

## 2016-09-01 DIAGNOSIS — C50411 Malignant neoplasm of upper-outer quadrant of right female breast: Secondary | ICD-10-CM

## 2016-09-01 DIAGNOSIS — C50212 Malignant neoplasm of upper-inner quadrant of left female breast: Secondary | ICD-10-CM

## 2016-09-01 LAB — CBC WITH DIFFERENTIAL/PLATELET
BASO%: 0.3 % (ref 0.0–2.0)
Basophils Absolute: 0 10*3/uL (ref 0.0–0.1)
EOS%: 3.9 % (ref 0.0–7.0)
Eosinophils Absolute: 0.1 10*3/uL (ref 0.0–0.5)
HCT: 30.9 % — ABNORMAL LOW (ref 34.8–46.6)
HGB: 9.6 g/dL — ABNORMAL LOW (ref 11.6–15.9)
LYMPH%: 30.5 % (ref 14.0–49.7)
MCH: 25.7 pg (ref 25.1–34.0)
MCHC: 31.1 g/dL — AB (ref 31.5–36.0)
MCV: 82.8 fL (ref 79.5–101.0)
MONO#: 0.7 10*3/uL (ref 0.1–0.9)
MONO%: 20.7 % — AB (ref 0.0–14.0)
NEUT%: 44.6 % (ref 38.4–76.8)
NEUTROS ABS: 1.6 10*3/uL (ref 1.5–6.5)
PLATELETS: 281 10*3/uL (ref 145–400)
RBC: 3.73 10*6/uL (ref 3.70–5.45)
RDW: 15.9 % — ABNORMAL HIGH (ref 11.2–14.5)
WBC: 3.6 10*3/uL — AB (ref 3.9–10.3)
lymph#: 1.1 10*3/uL (ref 0.9–3.3)
nRBC: 2 % — ABNORMAL HIGH (ref 0–0)

## 2016-09-01 LAB — COMPREHENSIVE METABOLIC PANEL
ALT: 20 U/L (ref 0–55)
ANION GAP: 9 meq/L (ref 3–11)
AST: 13 U/L (ref 5–34)
Albumin: 3.4 g/dL — ABNORMAL LOW (ref 3.5–5.0)
Alkaline Phosphatase: 74 U/L (ref 40–150)
BUN: 11 mg/dL (ref 7.0–26.0)
CALCIUM: 8.7 mg/dL (ref 8.4–10.4)
CHLORIDE: 108 meq/L (ref 98–109)
CO2: 26 meq/L (ref 22–29)
CREATININE: 0.9 mg/dL (ref 0.6–1.1)
EGFR: >90 mL/min/{1.73_m2} (ref >90)
Glucose: 110 mg/dl (ref 70–140)
POTASSIUM: 3.9 meq/L (ref 3.5–5.1)
Sodium: 142 mEq/L (ref 136–145)
Total Bilirubin: 0.33 mg/dL (ref 0.20–1.20)
Total Protein: 6.6 g/dL (ref 6.4–8.3)

## 2016-09-01 MED ORDER — SODIUM CHLORIDE 0.9% FLUSH
10.0000 mL | INTRAVENOUS | Status: DC | PRN
Start: 2016-09-01 — End: 2016-09-01
  Administered 2016-09-01: 10 mL via INTRAVENOUS
  Filled 2016-09-01: qty 10

## 2016-09-01 MED ORDER — HEPARIN SOD (PORK) LOCK FLUSH 100 UNIT/ML IV SOLN
500.0000 [IU] | Freq: Once | INTRAVENOUS | Status: AC | PRN
  Administered 2016-09-01: 500 [IU] via INTRAVENOUS
  Filled 2016-09-01: qty 5

## 2016-09-01 NOTE — Assessment & Plan Note (Signed)
Bilateral mastectomies 07/24/2016  Left mastectomy: ILC grade 2, 1.4 cm, margins negative, 0/2 lymph nodes negative, T1 CN 0 stage IA, ER 95%, PR 0%, HER-2 negative, Ki-67 20% Right mastectomy: Benign  Oncotype DX score 26:17% risk of distant recurrence  Recommendation: 1. Adjuvant chemotherapy with Taxotere and Cytoxan every 3 weeks 4 cycles 2. followed by anastrozole 1 mg daily with ovarian suppression (labs on 08/04/2016 showed [place] 21.7 and estradiol less than 2.5)  -------------------------------------------------------------------------- Current treatment: Cycle 1 day 8 Taxotere and Cytoxan  Chemotherapy toxicities: 1. Mild nausea 2. mild fatigue  Labs again reviewed. Patient has chronic normocytic anemia which is unchanged. Does not have neutropenia. Return to clinic in 2 weeks for cycle 2.

## 2016-09-01 NOTE — Progress Notes (Signed)
Patient Care Team: [name] as PCP - General (Family Medicine) Jovita Kussmaul, MD as Consulting Physician (General Surgery) Nicholas Lose, MD as Consulting Physician (Hematology and Oncology) Gery Pray, MD as Consulting Physician (Radiation Oncology) Rockwell Germany, RN as Registered Nurse Mauro Kaufmann, RN as Registered Nurse Holley Bouche, NP as Nurse Practitioner (Nurse Practitioner) Sylvan Cheese, NP as Nurse Practitioner (Nurse Practitioner)  DIAGNOSIS:  Encounter Diagnosis  Name Primary?  . 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; ~ 3 cm AP diameter linear clumped NME extends from the medial biopsy cavity posteriorly in the central breast which is suspicious for disease extension      04/26/2014 Clinical Stage    Stage 0: Tis Nx      06/05/2014 Definitive Surgery    Right lumpectomy [name]): ILC, grade 2/3, 0.6 cm, LCIS invasive ca focally present at inferior margin (rt Medial); ILC, grade 2-3, 1.7 cm with neg margins with LCIS (Rt Lateral) ER 100%, PR 99%, Ki 67 6%, HER2/neu neg (ratio 1.14)      06/05/2014 Oncotype testing    Score: 8 (ROR 6%)      06/05/2014 Pathologic Stage    Stage IA: mpT1c pNx      06/23/2014 Surgery    Right breast excision: Benign, 0/3 lymph nodes negative      07/24/2014 - 09/08/2014 Radiation Therapy    Adjuvant RT completed [name]): Right breast 45 Gy over  25 fractions.  Right breast boost 16 Gy over 8 fractions.  Total dose: 60 Gy.        09/25/2014 -  Anti-estrogen oral therapy    Tamoxifen 20 mg daily (Felicha Frayne).  Planned duration of therapy 5-10 years.      11/02/2014 Procedure    Breast/Ovarian panel revealed no clinically significant variant at ATM, BARD1, BRCA1, BRCA2, BRIP1, CDH1, CHEK2, FANCC, MLH1, MSH2, MSH6, NBN, PALB2, PMS2, PTEN, RAD51C, RAD51D, TP53, and XRCC2      11/03/2014 Survivorship    Survivorship visit completed and copy of survivorship care plan provided to patient.      05/07/2016 Breast MRI    Left breast non-mass enhancement spanning 6.9 cm. Small enhancing mass 5.6 cm, no lymph nodes. Right breast normal      05/20/2016 Pathology Results    Left breast biopsy posterior outer: Invasive lobular cancer with LCIS, ER 95%, PR 0%, Ki-67 20%, HER-2 negative ratio 1.25      07/24/2016 Surgery    Left mastectomy: ILC grade 2, 1.4 cm, margins negative, 0/2 lymph nodes negative, T1 CN 0 stage IA, ER 95%, PR 0%, HER-2 negative, Ki-67 20% Right mastectomy: Benign      08/11/2016 Oncotype testing    Oncotype score 26:17% risk of recurrence, high intermediate risk      08/25/2016 -  Chemotherapy    Taxotere and Cytoxan 4 cycles        CHIEF COMPLIANT:  Cycle 1 day 8 Taxotere and Cytoxan  INTERVAL HISTORY: [name] [name] is a 51 year old with above-mentioned history of recurrent left breast cancer. She is currently on adjuvant chemotherapy with Taxotere and Cytoxan. Today is cycle 1 day 8. Overall she feels pretty good. After first cycle she felt nausea for a day or 2. She also felt more tired. Denied any profound vomiting are any major toxicities. She has been able to function relatively well. She took Claritin which helped her bone aches related to Neulasta.  REVIEW OF SYSTEMS:   Constitutional: Denies fevers, chills or abnormal weight loss Eyes: Denies blurriness of vision Ears, nose, mouth, throat, and face: Denies  mucositis or sore throat Respiratory: Denies cough, dyspnea or wheezes Cardiovascular: Denies palpitation, chest discomfort Gastrointestinal:  Denies nausea, heartburn or change in bowel habits Skin: Denies abnormal skin rashes Lymphatics: Denies new lymphadenopathy or easy bruising Neurological:Denies numbness, tingling or new weaknesses Behavioral/Psych: Mood is stable, no new changes  Extremities: No lower extremity edema All other systems were reviewed with the patient and are negative.  I have reviewed the past medical history, past surgical history, social history and family history with the patient and they are unchanged from previous note.  ALLERGIES:  is allergic to penicillins; shellfish allergy; and tetracyclines & related.  MEDICATIONS:  Current Outpatient Prescriptions  Medication Sig Dispense Refill  . atorvastatin (LIPITOR) 10 MG tablet Take 1 tablet (10 mg total) by mouth daily.    Marland Kitchen dexamethasone (DECADRON) 4 MG tablet Take 1 tablet (4 mg total) by mouth daily. Start the day before Taxotere. Then again the day after chemo 30 tablet 1  . diazepam (VALIUM) 2 MG tablet Take 2 mg by mouth every 8 (eight) hours as needed for muscle spasms.  0  . glimepiride (AMARYL) 4 MG tablet Take 1 tablet (4 mg total) by mouth daily with breakfast.    . lidocaine-prilocaine (EMLA) cream Apply to affected area once 30 g 3  . metFORMIN (GLUCOPHAGE) 1000 MG tablet Take 1,000 mg by mouth 2 (two) times daily with a meal.    . ondansetron (ZOFRAN) 8 MG tablet Take 1 tablet (8 mg total) by mouth 2 (two) times daily as needed for refractory nausea / vomiting. Start on day 3 after chemo. 30 tablet 1  . prochlorperazine (COMPAZINE) 10 MG tablet Take 1 tablet (10 mg total) by mouth every 6 (six) hours as needed (Nausea or vomiting). 30 tablet 1  . traMADol (ULTRAM) 50 MG tablet Take 50 mg by mouth every 6 (six) hours as needed for pain.  0   No current facility-administered medications for this visit.      PHYSICAL EXAMINATION: ECOG PERFORMANCE STATUS: 1 - Symptomatic but completely ambulatory  Vitals:   09/01/16 1056  BP: (!) 130/96  Pulse: 84  Resp: 18  Temp: 97.8 F (36.6 C)   Filed Weights   09/01/16 1056  Weight: 237 lb 6.4 oz (107.7 kg)    GENERAL:alert, no distress and comfortable SKIN: skin color, texture, turgor are normal, no rashes or significant lesions EYES: normal, Conjunctiva are pink and non-injected, sclera clear OROPHARYNX:no exudate, no erythema and lips, buccal mucosa, and tongue normal  NECK: supple, thyroid normal size, non-tender, without nodularity LYMPH:  no palpable lymphadenopathy in the cervical, axillary or inguinal LUNGS: clear to auscultation and percussion with normal breathing effort HEART: regular rate & rhythm and no murmurs and no lower extremity edema ABDOMEN:abdomen soft, non-tender and normal bowel sounds MUSCULOSKELETAL:no cyanosis of digits and no clubbing  NEURO: alert & oriented x 3 with fluent speech, no focal motor/sensory deficits EXTREMITIES: No lower extremity edema  LABORATORY DATA:  I have reviewed the data as listed   Chemistry      Component Value Date/Time   NA 142 09/01/2016 1031   K 3.9 09/01/2016 1031   CL 103 08/19/2016 1351   CO2 26 09/01/2016 1031   BUN 11.0 09/01/2016 1031   CREATININE 0.9 09/01/2016 1031      Component Value Date/Time   CALCIUM 8.7 09/01/2016 1031   ALKPHOS 74 09/01/2016 1031   AST 13 09/01/2016 1031   ALT 20 09/01/2016 1031   BILITOT 0.33 09/01/2016 1031       Lab Results  Component Value Date   WBC 3.6 (L) 09/01/2016   HGB 9.6 (L) 09/01/2016   HCT 30.9 (L) 09/01/2016   MCV 82.8 09/01/2016   PLT 281 09/01/2016   NEUTROABS 1.6 09/01/2016    ASSESSMENT & PLAN:  Breast cancer of upper-outer quadrant of right female breast Bilateral mastectomies 07/24/2016  Left mastectomy: ILC grade 2, 1.4 cm, margins negative, 0/2 lymph nodes negative, T1 CN 0 stage IA, ER 95%, PR 0%,  HER-2 negative, Ki-67 20% Right mastectomy: Benign  Oncotype DX score 26:17% risk of distant recurrence  Recommendation: 1. Adjuvant chemotherapy with Taxotere and Cytoxan every 3 weeks 4 cycles 2. followed by anastrozole 1 mg daily with ovarian suppression (labs on 08/04/2016 showed [place] 21.7 and estradiol less than 2.5)  -------------------------------------------------------------------------- Current treatment: Cycle 1 day 8 Taxotere and Cytoxan  Chemotherapy toxicities: 1. Mild nausea 2. mild fatigue  Labs again reviewed. Patient has chronic normocytic anemia which is unchanged. Does not have neutropenia. Return to clinic in 2 weeks for cycle 2.  I spent 25 minutes talking to the patient of which more than half was spent in counseling and coordination of care.  No orders of the defined types were placed in this encounter.  The patient has a good understanding of the overall plan. she agrees with it. she will call with any problems that may develop before the next visit here.   Rulon Eisenmenger, MD 09/01/16

## 2016-09-15 ENCOUNTER — Ambulatory Visit (HOSPITAL_BASED_OUTPATIENT_CLINIC_OR_DEPARTMENT_OTHER): Payer: 59 | Admitting: Hematology and Oncology

## 2016-09-15 ENCOUNTER — Ambulatory Visit (HOSPITAL_BASED_OUTPATIENT_CLINIC_OR_DEPARTMENT_OTHER): Payer: 59

## 2016-09-15 ENCOUNTER — Encounter: Payer: Self-pay | Admitting: Hematology and Oncology

## 2016-09-15 ENCOUNTER — Other Ambulatory Visit (HOSPITAL_BASED_OUTPATIENT_CLINIC_OR_DEPARTMENT_OTHER): Payer: 59

## 2016-09-15 ENCOUNTER — Ambulatory Visit: Payer: 59

## 2016-09-15 VITALS — BP 179/101 | HR 113

## 2016-09-15 DIAGNOSIS — R530 Neoplastic (malignant) related fatigue: Secondary | ICD-10-CM

## 2016-09-15 DIAGNOSIS — Z853 Personal history of malignant neoplasm of breast: Secondary | ICD-10-CM

## 2016-09-15 DIAGNOSIS — Z5111 Encounter for antineoplastic chemotherapy: Secondary | ICD-10-CM | POA: Diagnosis not present

## 2016-09-15 DIAGNOSIS — Z170 Estrogen receptor positive status [ER+]: Principal | ICD-10-CM

## 2016-09-15 DIAGNOSIS — C50212 Malignant neoplasm of upper-inner quadrant of left female breast: Secondary | ICD-10-CM

## 2016-09-15 DIAGNOSIS — C50912 Malignant neoplasm of unspecified site of left female breast: Secondary | ICD-10-CM | POA: Diagnosis not present

## 2016-09-15 DIAGNOSIS — Z5189 Encounter for other specified aftercare: Secondary | ICD-10-CM | POA: Diagnosis not present

## 2016-09-15 DIAGNOSIS — R110 Nausea: Secondary | ICD-10-CM

## 2016-09-15 DIAGNOSIS — Z95828 Presence of other vascular implants and grafts: Secondary | ICD-10-CM

## 2016-09-15 DIAGNOSIS — D649 Anemia, unspecified: Secondary | ICD-10-CM

## 2016-09-15 DIAGNOSIS — C50411 Malignant neoplasm of upper-outer quadrant of right female breast: Secondary | ICD-10-CM

## 2016-09-15 LAB — COMPREHENSIVE METABOLIC PANEL
ALBUMIN: 3.6 g/dL (ref 3.5–5.0)
ALK PHOS: 83 U/L (ref 40–150)
ALT: 14 U/L (ref 0–55)
ANION GAP: 9 meq/L (ref 3–11)
AST: 11 U/L (ref 5–34)
BILIRUBIN TOTAL: 0.35 mg/dL (ref 0.20–1.20)
BUN: 12.5 mg/dL (ref 7.0–26.0)
CO2: 26 mEq/L (ref 22–29)
Calcium: 9.2 mg/dL (ref 8.4–10.4)
Chloride: 105 mEq/L (ref 98–109)
Creatinine: 0.9 mg/dL (ref 0.6–1.1)
EGFR: 82 mL/min/{1.73_m2} — AB (ref >90)
GLUCOSE: 271 mg/dL — AB (ref 70–140)
POTASSIUM: 3.9 meq/L (ref 3.5–5.1)
SODIUM: 140 meq/L (ref 136–145)
TOTAL PROTEIN: 7.3 g/dL (ref 6.4–8.3)

## 2016-09-15 LAB — CBC WITH DIFFERENTIAL/PLATELET
BASO%: 0.5 % (ref 0.0–2.0)
BASOS ABS: 0.1 10*3/uL (ref 0.0–0.1)
EOS ABS: 0.1 10*3/uL (ref 0.0–0.5)
EOS%: 0.7 % (ref 0.0–7.0)
HCT: 30.3 % — ABNORMAL LOW (ref 34.8–46.6)
HEMOGLOBIN: 9.9 g/dL — AB (ref 11.6–15.9)
LYMPH%: 6 % — AB (ref 14.0–49.7)
MCH: 26 pg (ref 25.1–34.0)
MCHC: 32.7 g/dL (ref 31.5–36.0)
MCV: 79.7 fL (ref 79.5–101.0)
MONO#: 0.2 10*3/uL (ref 0.1–0.9)
MONO%: 2.4 % (ref 0.0–14.0)
NEUT#: 9.2 10*3/uL — ABNORMAL HIGH (ref 1.5–6.5)
NEUT%: 90.4 % — ABNORMAL HIGH (ref 38.4–76.8)
PLATELETS: 334 10*3/uL (ref 145–400)
RBC: 3.8 10*6/uL (ref 3.70–5.45)
RDW: 16.7 % — AB (ref 11.2–14.5)
WBC: 10.2 10*3/uL (ref 3.9–10.3)
lymph#: 0.6 10*3/uL — ABNORMAL LOW (ref 0.9–3.3)

## 2016-09-15 MED ORDER — PALONOSETRON HCL INJECTION 0.25 MG/5ML
INTRAVENOUS | Status: AC
  Filled 2016-09-15: qty 5

## 2016-09-15 MED ORDER — SODIUM CHLORIDE 0.9 % IV SOLN
Freq: Once | INTRAVENOUS | Status: AC
  Administered 2016-09-15: 13:00:00 via INTRAVENOUS

## 2016-09-15 MED ORDER — SODIUM CHLORIDE 0.9% FLUSH
10.0000 mL | INTRAVENOUS | Status: DC | PRN
  Administered 2016-09-15: 10 mL
  Filled 2016-09-15: qty 10

## 2016-09-15 MED ORDER — HEPARIN SOD (PORK) LOCK FLUSH 100 UNIT/ML IV SOLN
500.0000 [IU] | Freq: Once | INTRAVENOUS | Status: AC | PRN
  Administered 2016-09-15: 500 [IU]
  Filled 2016-09-15: qty 5

## 2016-09-15 MED ORDER — SODIUM CHLORIDE 0.9% FLUSH
10.0000 mL | INTRAVENOUS | Status: DC | PRN
  Administered 2016-09-15: 10 mL via INTRAVENOUS
  Filled 2016-09-15: qty 10

## 2016-09-15 MED ORDER — PEGFILGRASTIM 6 MG/0.6ML ~~LOC~~ PSKT
6.0000 mg | PREFILLED_SYRINGE | Freq: Once | SUBCUTANEOUS | Status: AC
  Administered 2016-09-15: 6 mg via SUBCUTANEOUS
  Filled 2016-09-15: qty 0.6

## 2016-09-15 MED ORDER — SODIUM CHLORIDE 0.9 % IV SOLN
600.0000 mg/m2 | Freq: Once | INTRAVENOUS | Status: AC
  Administered 2016-09-15: 1380 mg via INTRAVENOUS
  Filled 2016-09-15: qty 69

## 2016-09-15 MED ORDER — SODIUM CHLORIDE 0.9 % IV SOLN
75.0000 mg/m2 | Freq: Once | INTRAVENOUS | Status: AC
  Administered 2016-09-15: 170 mg via INTRAVENOUS
  Filled 2016-09-15: qty 17

## 2016-09-15 MED ORDER — PALONOSETRON HCL INJECTION 0.25 MG/5ML
0.2500 mg | Freq: Once | INTRAVENOUS | Status: AC
  Administered 2016-09-15: 0.25 mg via INTRAVENOUS

## 2016-09-15 MED ORDER — DEXAMETHASONE SODIUM PHOSPHATE 10 MG/ML IJ SOLN
INTRAMUSCULAR | Status: AC
  Filled 2016-09-15: qty 1

## 2016-09-15 MED ORDER — DEXAMETHASONE SODIUM PHOSPHATE 10 MG/ML IJ SOLN
10.0000 mg | Freq: Once | INTRAMUSCULAR | Status: AC
  Administered 2016-09-15: 10 mg via INTRAVENOUS

## 2016-09-15 NOTE — Progress Notes (Signed)
1221: Per Dr. Lindi Adie, okay to proceed with treatment with b/p 179/101 and HR 113, NNO at this time.

## 2016-09-15 NOTE — Patient Instructions (Signed)
[facility] Discharge Instructions for Patients Receiving Chemotherapy  Today you received the following chemotherapy agents: Taxotere, Cytoxan   To help prevent nausea and vomiting after your treatment, we encourage you to take your nausea medication as directed.    If you develop nausea and vomiting that is not controlled by your nausea medication, call the clinic.   BELOW ARE SYMPTOMS THAT SHOULD BE REPORTED IMMEDIATELY:  *FEVER GREATER THAN 100.5 F  *CHILLS WITH OR WITHOUT FEVER  NAUSEA AND VOMITING THAT IS NOT CONTROLLED WITH YOUR NAUSEA MEDICATION  *UNUSUAL SHORTNESS OF BREATH  *UNUSUAL BRUISING OR BLEEDING  TENDERNESS IN MOUTH AND THROAT WITH OR WITHOUT PRESENCE OF ULCERS  *URINARY PROBLEMS  *BOWEL PROBLEMS  UNUSUAL RASH Items with * indicate a potential emergency and should be followed up as soon as possible.  Feel free to call the clinic you have any questions or concerns. The clinic phone number is [phone].  Please show the [place] at check-in to the Emergency Department and triage nurse.

## 2016-09-15 NOTE — Patient Instructions (Signed)

## 2016-09-15 NOTE — Progress Notes (Signed)
Patient Care Team: [name] as PCP - General (Family Medicine) Jovita Kussmaul, MD as Consulting Physician (General Surgery) Nicholas Lose, MD as Consulting Physician (Hematology and Oncology) Gery Pray, MD as Consulting Physician (Radiation Oncology) Rockwell Germany, RN as Registered Nurse Mauro Kaufmann, RN as Registered Nurse Holley Bouche, NP as Nurse Practitioner (Nurse Practitioner) Sylvan Cheese, NP as Nurse Practitioner (Nurse Practitioner)  DIAGNOSIS:  Encounter Diagnosis  Name Primary?  . 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; ~ 3 cm AP diameter linear clumped NME extends from the medial biopsy cavity posteriorly in the central breast which is suspicious for disease extension      04/26/2014 Clinical Stage    Stage 0: Tis Nx      06/05/2014 Definitive Surgery    Right lumpectomy [name]): ILC, grade 2/3, 0.6 cm, LCIS invasive ca focally present at inferior margin (rt Medial); ILC, grade 2-3, 1.7 cm with neg margins with LCIS (Rt Lateral) ER 100%, PR 99%, Ki 67 6%, HER2/neu neg (ratio 1.14)      06/05/2014 Oncotype testing    Score: 8 (ROR 6%)      06/05/2014 Pathologic Stage    Stage IA: mpT1c pNx      06/23/2014 Surgery    Right breast excision: Benign, 0/3 lymph nodes negative      07/24/2014 - 09/08/2014 Radiation Therapy    Adjuvant RT completed [name]): Right breast 45 Gy over  25 fractions.  Right breast boost 16 Gy over 8 fractions.  Total dose: 60 Gy.        09/25/2014 -  Anti-estrogen oral therapy    Tamoxifen 20 mg daily (Mayzee Reichenbach).  Planned duration of therapy 5-10 years.      11/02/2014 Procedure    Breast/Ovarian panel revealed no clinically significant variant at ATM, BARD1, BRCA1, BRCA2, BRIP1, CDH1, CHEK2, FANCC, MLH1, MSH2, MSH6, NBN, PALB2, PMS2, PTEN, RAD51C, RAD51D, TP53, and XRCC2      11/03/2014 Survivorship    Survivorship visit completed and copy of survivorship care plan provided to patient.      05/07/2016 Breast MRI    Left breast non-mass enhancement spanning 6.9 cm. Small enhancing mass 5.6 cm, no lymph nodes. Right breast normal      05/20/2016 Pathology Results    Left breast biopsy posterior outer: Invasive lobular cancer with LCIS, ER 95%, PR 0%, Ki-67 20%, HER-2 negative ratio 1.25      07/24/2016 Surgery    Left mastectomy: ILC grade 2, 1.4 cm, margins negative, 0/2 lymph nodes negative, T1 CN 0 stage IA, ER 95%, PR 0%, HER-2 negative, Ki-67 20% Right mastectomy: Benign      08/11/2016 Oncotype testing    Oncotype score 26:17% risk of recurrence, high intermediate risk      08/25/2016 -  Chemotherapy    Taxotere and Cytoxan 4 cycles        CHIEF COMPLIANT:  cycle 2 Taxotere and Cytoxan  INTERVAL HISTORY: [name] [name] is a 51 year old with above-mentioned history of left breast cancer treated with mastectomy and is currently on adjuvant chemotherapy. Today is cycle 2 of Taxotere and Cytoxan. She is tolerating it extremely well. She did not have any nausea vomiting. She did have complete alopecia.  REVIEW OF SYSTEMS:   Constitutional: Denies fevers, chills or abnormal weight loss Eyes: Denies blurriness of vision Ears, nose, mouth, throat, and face: Denies mucositis or sore throat Respiratory: Denies cough, dyspnea or wheezes Cardiovascular: Denies palpitation, chest discomfort Gastrointestinal:  Denies nausea, heartburn or  change in bowel habits Skin: Denies abnormal skin rashes Lymphatics: Denies new lymphadenopathy or easy bruising Neurological:Denies numbness, tingling or new weaknesses Behavioral/Psych: Mood is stable, no new changes  Extremities: No lower extremity edema  All other systems were reviewed with the patient and are negative.  I have reviewed the past medical history, past surgical history, social history and family history with the patient and they are unchanged from previous note.  ALLERGIES:  is allergic to penicillins; shellfish allergy; and tetracyclines & related.  MEDICATIONS:  Current Outpatient Prescriptions  Medication Sig Dispense Refill  . atorvastatin (LIPITOR) 10 MG tablet Take 1 tablet (10 mg total) by mouth daily.    Marland Kitchen dexamethasone (DECADRON) 4 MG tablet Take 1 tablet (4 mg total) by mouth daily. Start the day before Taxotere. Then again the day after chemo 30 tablet 1  . diazepam (VALIUM) 2 MG tablet Take 2 mg by mouth every 8 (eight) hours as needed for muscle spasms.  0  . glimepiride (AMARYL) 4 MG tablet Take 1 tablet (4 mg total) by mouth daily with breakfast.    . lidocaine-prilocaine (EMLA) cream Apply to affected area once 30 g 3  . metFORMIN (GLUCOPHAGE) 1000 MG tablet Take 1,000 mg by mouth 2 (two) times daily with a meal.    . ondansetron (ZOFRAN) 8 MG tablet Take 1 tablet (8 mg total) by mouth 2 (two) times daily as needed for refractory nausea / vomiting. Start on day 3 after chemo. 30 tablet 1  . prochlorperazine (COMPAZINE) 10 MG tablet Take 1 tablet (10 mg total) by mouth every 6 (six) hours as needed (Nausea or vomiting). 30 tablet 1  . traMADol (ULTRAM) 50 MG tablet Take 50 mg by mouth every 6 (six) hours as needed for pain.  0   No current facility-administered medications for this visit.    Facility-Administered Medications Ordered in Other Visits  Medication Dose Route Frequency Provider Last Rate Last Dose  . cyclophosphamide (CYTOXAN) 1,380 mg in  sodium chloride 0.9 % 250 mL chemo infusion  600 mg/m2 (Treatment Plan Recorded) Intravenous Once Nicholas Lose, MD      . DOCEtaxel (TAXOTERE) 170 mg in sodium chloride 0.9 % 250 mL chemo infusion  75 mg/m2 (Treatment Plan Recorded) Intravenous Once Nicholas Lose, MD 267 mL/hr at 09/15/16 1332 170 mg at 09/15/16 1332  . heparin lock flush 100 unit/mL  500 Units Intracatheter Once PRN Nicholas Lose, MD      . pegfilgrastim (NEULASTA ONPRO KIT) injection 6 mg  6 mg Subcutaneous Once Nicholas Lose, MD      . sodium chloride flush (NS) 0.9 % injection 10 mL  10 mL Intracatheter PRN Nicholas Lose, MD        PHYSICAL EXAMINATION: ECOG PERFORMANCE STATUS: 1 - Symptomatic but completely ambulatory  Vitals:   09/15/16 1131  BP: (!) 161/101  Pulse: (!) 107  Resp: 20  Temp: (!) 97.5 F (36.4 C)   Filed Weights   09/15/16 1131  Weight: 239 lb 4.8 oz (108.5 kg)    GENERAL:alert, no distress and comfortable SKIN: skin color, texture, turgor are normal, no rashes or significant lesions EYES: normal, Conjunctiva are pink and non-injected, sclera clear OROPHARYNX:no exudate, no erythema and lips, buccal mucosa, and tongue normal  NECK: supple, thyroid normal size, non-tender, without nodularity LYMPH:  no palpable lymphadenopathy in the cervical, axillary or inguinal LUNGS: clear to auscultation and percussion with normal breathing effort HEART: regular rate & rhythm and no murmurs and no lower extremity edema ABDOMEN:abdomen soft, non-tender and normal bowel sounds MUSCULOSKELETAL:no cyanosis of digits and no clubbing  NEURO: alert & oriented x 3 with fluent speech, no focal motor/sensory deficits EXTREMITIES: No lower extremity edema  LABORATORY DATA:  I have reviewed the data as listed   Chemistry      Component Value Date/Time   NA 140 09/15/2016 1057   K 3.9 09/15/2016 1057   CL 103 08/19/2016 1351   CO2 26 09/15/2016 1057   BUN 12.5 09/15/2016 1057   CREATININE 0.9 09/15/2016 1057       Component Value Date/Time   CALCIUM 9.2 09/15/2016 1057   ALKPHOS 83 09/15/2016 1057   AST 11 09/15/2016 1057   ALT 14 09/15/2016 1057   BILITOT 0.35 09/15/2016 1057       Lab Results  Component Value Date   WBC 10.2 09/15/2016   HGB 9.9 (L) 09/15/2016   HCT 30.3 (L) 09/15/2016   MCV 79.7 09/15/2016   PLT 334 09/15/2016   NEUTROABS 9.2 (H) 09/15/2016    ASSESSMENT & PLAN:  Breast cancer of upper-outer quadrant of right female breast Bilateral mastectomies 07/24/2016  Left mastectomy: ILC grade 2, 1.4 cm, margins negative, 0/2 lymph nodes negative, T1 CN 0 stage IA, ER 95%, PR 0%, HER-2 negative, Ki-67 20% Right mastectomy: Benign  Oncotype DX score 26:17% risk of distant recurrence  Recommendation: 1.Adjuvant chemotherapy with Taxotere and Cytoxan every 3 weeks 4 cycles 2. followed by anastrozole 1 mg daily with ovarian suppression (labs on 08/04/2016 showed FSH21.7 and estradiol less than 2.5)  -------------------------------------------------------------------------- Current treatment: Cycle 2 day 1 Taxotere and Cytoxan  Chemotherapy toxicities: 1. Mild nausea 2. mild fatigue  Labs again reviewed. Patient has chronic normocytic anemia which is unchanged. Does not have neutropenia. Return to clinic in 3 weeks for cycle 3.   I spent 25 minutes talking to the patient of which more than half was spent in counseling and coordination of care.  No orders of the defined types were placed in this encounter.  The patient has a good understanding of the overall plan. she agrees with it. she will call with any problems that may develop before the next visit here.   Rulon Eisenmenger, MD 09/15/16

## 2016-09-15 NOTE — Assessment & Plan Note (Signed)
Bilateral mastectomies 07/24/2016  Left mastectomy: ILC grade 2, 1.4 cm, margins negative, 0/2 lymph nodes negative, T1 CN 0 stage IA, ER 95%, PR 0%, HER-2 negative, Ki-67 20% Right mastectomy: Benign  Oncotype DX score 26:17% risk of distant recurrence  Recommendation: 1.Adjuvant chemotherapy with Taxotere and Cytoxan every 3 weeks 4 cycles 2. followed by anastrozole 1 mg daily with ovarian suppression (labs on 08/04/2016 showed FSH21.7 and estradiol less than 2.5)  -------------------------------------------------------------------------- Current treatment: Cycle 2 day 1 Taxotere and Cytoxan  Chemotherapy toxicities: 1. Mild nausea 2. mild fatigue  Labs again reviewed. Patient has chronic normocytic anemia which is unchanged. Does not have neutropenia. Return to clinic in 3 weeks for cycle 3.

## 2016-10-06 ENCOUNTER — Ambulatory Visit (HOSPITAL_BASED_OUTPATIENT_CLINIC_OR_DEPARTMENT_OTHER): Payer: 59

## 2016-10-06 ENCOUNTER — Other Ambulatory Visit: Payer: Self-pay | Admitting: Hematology and Oncology

## 2016-10-06 ENCOUNTER — Other Ambulatory Visit (HOSPITAL_BASED_OUTPATIENT_CLINIC_OR_DEPARTMENT_OTHER): Payer: 59

## 2016-10-06 ENCOUNTER — Ambulatory Visit: Payer: 59

## 2016-10-06 ENCOUNTER — Encounter: Payer: Self-pay | Admitting: Hematology and Oncology

## 2016-10-06 ENCOUNTER — Ambulatory Visit (HOSPITAL_BASED_OUTPATIENT_CLINIC_OR_DEPARTMENT_OTHER): Payer: 59 | Admitting: Hematology and Oncology

## 2016-10-06 DIAGNOSIS — Z853 Personal history of malignant neoplasm of breast: Secondary | ICD-10-CM

## 2016-10-06 DIAGNOSIS — Z170 Estrogen receptor positive status [ER+]: Principal | ICD-10-CM

## 2016-10-06 DIAGNOSIS — C50411 Malignant neoplasm of upper-outer quadrant of right female breast: Secondary | ICD-10-CM

## 2016-10-06 DIAGNOSIS — C50212 Malignant neoplasm of upper-inner quadrant of left female breast: Secondary | ICD-10-CM

## 2016-10-06 DIAGNOSIS — R530 Neoplastic (malignant) related fatigue: Secondary | ICD-10-CM | POA: Diagnosis not present

## 2016-10-06 DIAGNOSIS — R110 Nausea: Secondary | ICD-10-CM | POA: Diagnosis not present

## 2016-10-06 DIAGNOSIS — Z95828 Presence of other vascular implants and grafts: Secondary | ICD-10-CM

## 2016-10-06 DIAGNOSIS — Z5189 Encounter for other specified aftercare: Secondary | ICD-10-CM

## 2016-10-06 DIAGNOSIS — D649 Anemia, unspecified: Secondary | ICD-10-CM | POA: Diagnosis not present

## 2016-10-06 DIAGNOSIS — Z5111 Encounter for antineoplastic chemotherapy: Secondary | ICD-10-CM | POA: Diagnosis not present

## 2016-10-06 LAB — CBC WITH DIFFERENTIAL/PLATELET
BASO%: 0.1 % (ref 0.0–2.0)
BASOS ABS: 0 10*3/uL (ref 0.0–0.1)
EOS%: 0 % (ref 0.0–7.0)
Eosinophils Absolute: 0 10*3/uL (ref 0.0–0.5)
HCT: 29 % — ABNORMAL LOW (ref 34.8–46.6)
HGB: 9.4 g/dL — ABNORMAL LOW (ref 11.6–15.9)
LYMPH%: 5 % — AB (ref 14.0–49.7)
MCH: 26.1 pg (ref 25.1–34.0)
MCHC: 32.4 g/dL (ref 31.5–36.0)
MCV: 80.6 fL (ref 79.5–101.0)
MONO#: 0.2 10*3/uL (ref 0.1–0.9)
MONO%: 2.1 % (ref 0.0–14.0)
NEUT#: 8.3 10*3/uL — ABNORMAL HIGH (ref 1.5–6.5)
NEUT%: 92.8 % — AB (ref 38.4–76.8)
Platelets: 315 10*3/uL (ref 145–400)
RBC: 3.59 10*6/uL — AB (ref 3.70–5.45)
RDW: 18.9 % — ABNORMAL HIGH (ref 11.2–14.5)
WBC: 9 10*3/uL (ref 3.9–10.3)
lymph#: 0.4 10*3/uL — ABNORMAL LOW (ref 0.9–3.3)

## 2016-10-06 LAB — COMPREHENSIVE METABOLIC PANEL
ALT: 14 U/L (ref 0–55)
ANION GAP: 9 meq/L (ref 3–11)
AST: 12 U/L (ref 5–34)
Albumin: 3.5 g/dL (ref 3.5–5.0)
Alkaline Phosphatase: 87 U/L (ref 40–150)
BILIRUBIN TOTAL: 0.32 mg/dL (ref 0.20–1.20)
BUN: 12.8 mg/dL (ref 7.0–26.0)
CHLORIDE: 107 meq/L (ref 98–109)
CO2: 24 meq/L (ref 22–29)
CREATININE: 0.9 mg/dL (ref 0.6–1.1)
Calcium: 9.6 mg/dL (ref 8.4–10.4)
EGFR: >90 mL/min/{1.73_m2} (ref >90)
Glucose: 218 mg/dl — ABNORMAL HIGH (ref 70–140)
Potassium: 4.2 mEq/L (ref 3.5–5.1)
Sodium: 140 mEq/L (ref 136–145)
TOTAL PROTEIN: 7.3 g/dL (ref 6.4–8.3)

## 2016-10-06 MED ORDER — SODIUM CHLORIDE 0.9 % IV SOLN
75.0000 mg/m2 | Freq: Once | INTRAVENOUS | Status: AC
  Administered 2016-10-06: 170 mg via INTRAVENOUS
  Filled 2016-10-06: qty 17

## 2016-10-06 MED ORDER — SODIUM CHLORIDE 0.9% FLUSH
10.0000 mL | INTRAVENOUS | Status: DC | PRN
  Administered 2016-10-06: 10 mL
  Filled 2016-10-06: qty 10

## 2016-10-06 MED ORDER — PEGFILGRASTIM 6 MG/0.6ML ~~LOC~~ PSKT
6.0000 mg | PREFILLED_SYRINGE | Freq: Once | SUBCUTANEOUS | Status: AC
  Administered 2016-10-06: 6 mg via SUBCUTANEOUS
  Filled 2016-10-06: qty 0.6

## 2016-10-06 MED ORDER — SODIUM CHLORIDE 0.9 % IV SOLN
600.0000 mg/m2 | Freq: Once | INTRAVENOUS | Status: AC
  Administered 2016-10-06: 1380 mg via INTRAVENOUS
  Filled 2016-10-06: qty 69

## 2016-10-06 MED ORDER — HEPARIN SOD (PORK) LOCK FLUSH 100 UNIT/ML IV SOLN
500.0000 [IU] | Freq: Once | INTRAVENOUS | Status: AC | PRN
  Administered 2016-10-06: 500 [IU]
  Filled 2016-10-06: qty 5

## 2016-10-06 MED ORDER — SODIUM CHLORIDE 0.9% FLUSH
10.0000 mL | INTRAVENOUS | Status: DC | PRN
  Administered 2016-10-06: 10 mL via INTRAVENOUS
  Filled 2016-10-06: qty 10

## 2016-10-06 MED ORDER — DEXAMETHASONE SODIUM PHOSPHATE 10 MG/ML IJ SOLN
10.0000 mg | Freq: Once | INTRAMUSCULAR | Status: AC
  Administered 2016-10-06: 10 mg via INTRAVENOUS

## 2016-10-06 MED ORDER — PALONOSETRON HCL INJECTION 0.25 MG/5ML
0.2500 mg | Freq: Once | INTRAVENOUS | Status: AC
  Administered 2016-10-06: 0.25 mg via INTRAVENOUS

## 2016-10-06 MED ORDER — PALONOSETRON HCL INJECTION 0.25 MG/5ML
INTRAVENOUS | Status: AC
  Filled 2016-10-06: qty 5

## 2016-10-06 MED ORDER — SODIUM CHLORIDE 0.9 % IV SOLN
Freq: Once | INTRAVENOUS | Status: AC
  Administered 2016-10-06: 12:00:00 via INTRAVENOUS

## 2016-10-06 MED ORDER — DEXAMETHASONE SODIUM PHOSPHATE 10 MG/ML IJ SOLN
INTRAMUSCULAR | Status: AC
  Filled 2016-10-06: qty 1

## 2016-10-06 NOTE — Patient Instructions (Signed)
[facility] Discharge Instructions for Patients Receiving Chemotherapy  Today you received the following chemotherapy agents: Taxol, Cytoxan and Neulasta On-Pro application. To help prevent nausea and vomiting after your treatment, we encourage you to take your nausea medication: Compazine. Take one every 6 hours as needed. For nausea on or after 8/30, take Zofran every 8 hours.   If you develop nausea and vomiting that is not controlled by your nausea medication, call the clinic.   BELOW ARE SYMPTOMS THAT SHOULD BE REPORTED IMMEDIATELY:  *FEVER GREATER THAN 100.5 F  *CHILLS WITH OR WITHOUT FEVER  NAUSEA AND VOMITING THAT IS NOT CONTROLLED WITH YOUR NAUSEA MEDICATION  *UNUSUAL SHORTNESS OF BREATH  *UNUSUAL BRUISING OR BLEEDING  TENDERNESS IN MOUTH AND THROAT WITH OR WITHOUT PRESENCE OF ULCERS  *URINARY PROBLEMS  *BOWEL PROBLEMS  UNUSUAL RASH Items with * indicate a potential emergency and should be followed up as soon as possible.  Feel free to call the clinic should you have any questions or concerns. The clinic phone number is [phone].  Please show the [facility] [facility] at check-in to the Emergency Department and triage nurse.

## 2016-10-06 NOTE — Assessment & Plan Note (Signed)
Bilateral mastectomies 07/24/2016  Left mastectomy: ILC grade 2, 1.4 cm, margins negative, 0/2 lymph nodes negative, T1 CN 0 stage IA, ER 95%, PR 0%, HER-2 negative, Ki-67 20% Right mastectomy: Benign  Oncotype DX score 26:17% risk of distant recurrence  Recommendation: 1.Adjuvant chemotherapy with Taxotere and Cytoxan every 3 weeks 4 cycles 2. followed by anastrozole 1 mg daily with ovarian suppression (labs on 08/04/2016 showed FSH21.7 and estradiol less than 2.5)  -------------------------------------------------------------------------- Current treatment: Cycle 3 day 1Taxotere and Cytoxan  Chemotherapy toxicities: 1. Mild nausea 2. mild fatigue  Labs again reviewed. Patient has chronic normocytic anemia which is unchanged. Does not have neutropenia. Return to clinic in 3weeksfor cycle 4.

## 2016-10-06 NOTE — Progress Notes (Signed)
Patient Care Team: [name] as PCP - General (Family Medicine) [name] Kussmaul, MD as Consulting Physician (General Surgery) [name] Lose, MD as Consulting Physician (Hematology and Oncology) [name] Pray, MD as Consulting Physician (Radiation Oncology) [name] Germany, RN as Registered Nurse [name] Kaufmann, RN as Registered Nurse [name] Bouche, NP as Nurse Practitioner (Nurse Practitioner) [name] Cheese, NP as Nurse Practitioner (Nurse Practitioner)  DIAGNOSIS:  Encounter Diagnosis  Name Primary?  . 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; ~ 3 cm AP diameter linear clumped NME extends from the medial biopsy cavity posteriorly in the central breast which is suspicious for disease extension      04/26/2014 Clinical Stage    Stage 0: Tis Nx      06/05/2014 Definitive Surgery    Right lumpectomy [name] [name]): ILC, grade 2/3, 0.6 cm, LCIS invasive ca focally present at inferior margin (rt Medial); ILC, grade 2-3, 1.7 cm with neg margins with LCIS (Rt Lateral) ER 100%, PR 99%, Ki 67 6%, HER2/neu neg (ratio 1.14)      06/05/2014 Oncotype testing    Score: 8 (ROR 6%)      06/05/2014 Pathologic Stage    Stage IA: mpT1c pNx      06/23/2014 Surgery    Right breast excision: Benign, 0/3 lymph nodes negative      07/24/2014 - 09/08/2014 Radiation Therapy    Adjuvant RT completed [name] [name]): Right breast 45 Gy over  25 fractions.  Right breast boost 16 Gy over 8 fractions.  Total dose: 60 Gy.        09/25/2014 -  Anti-estrogen oral therapy    Tamoxifen 20 mg daily ([name] [name]).  Planned duration of therapy 5-10 years.      11/02/2014 Procedure    Breast/Ovarian panel revealed no clinically significant variant at ATM, BARD1, BRCA1, BRCA2, BRIP1, CDH1, CHEK2, FANCC, MLH1, MSH2, MSH6, NBN, PALB2, PMS2, PTEN, RAD51C, RAD51D, TP53, and XRCC2      11/03/2014 Survivorship    Survivorship visit completed and copy of survivorship care plan provided to patient.      05/07/2016 Breast MRI    Left breast non-mass enhancement spanning 6.9 cm. Small enhancing mass 5.6 cm, no lymph nodes. Right breast normal      05/20/2016 Pathology Results    Left breast biopsy posterior outer: Invasive lobular cancer with LCIS, ER 95%, PR 0%, Ki-67 20%, HER-2 negative ratio 1.25      07/24/2016 Surgery    Left mastectomy: ILC grade 2, 1.4 cm, margins negative, 0/2 lymph nodes negative, T1 CN 0 stage IA, ER 95%, PR 0%, HER-2 negative, Ki-67 20% Right mastectomy: Benign      08/11/2016 Oncotype testing    Oncotype score 26:17% risk of recurrence, high intermediate risk      08/25/2016 -  Chemotherapy    Taxotere and Cytoxan 4 cycles        CHIEF COMPLIANT:  Cycle 3 Taxotere and Cytoxan  INTERVAL HISTORY: [name] [name] is a 51 year old with above-mentioned history of left breast cancer recurrent disease underwent mastectomy and is currently on adjuvant chemotherapy with Taxotere and Cytoxan. Today is cycle 3 of treatment. Overall she is tolerated the treatment extremely well. She does not have any nausea vomiting or neuropathy. Her energy levels and appetite are excellent.  REVIEW OF SYSTEMS:   Constitutional: Denies fevers, chills or abnormal weight loss Eyes: Denies blurriness of vision Ears, nose, mouth, throat, and face: Denies mucositis or sore throat Respiratory: Denies cough, dyspnea or wheezes Cardiovascular: Denies  palpitation, chest discomfort Gastrointestinal:  Denies nausea, heartburn or change in bowel habits Skin: Denies abnormal skin rashes Lymphatics: Denies new lymphadenopathy or easy bruising Neurological:Denies numbness, tingling or new weaknesses Behavioral/Psych: Mood is stable, no new changes  Extremities: No lower extremity edema Breast:  denies any pain or lumps or nodules in either breasts All other systems were reviewed with the patient and are negative.  I have reviewed the past medical history, past surgical history, social history and family history with the patient and they are unchanged from previous note.  ALLERGIES:  is allergic to penicillins; shellfish allergy; and tetracyclines & related.  MEDICATIONS:  Current Outpatient Prescriptions  Medication Sig Dispense Refill  . atorvastatin (LIPITOR) 10 MG tablet Take 1 tablet (10 mg total) by mouth daily.    Marland Kitchen dexamethasone (DECADRON) 4 MG tablet Take 1 tablet (4 mg total) by mouth daily. Start the day before Taxotere. Then again the day after chemo 30 tablet 1  . diazepam (VALIUM) 2 MG tablet Take 2 mg by mouth every 8 (eight) hours as needed for muscle spasms.  0  . glimepiride (AMARYL) 4 MG tablet Take 1 tablet (4 mg total) by mouth daily with breakfast.    . lidocaine-prilocaine (EMLA) cream Apply to affected area once 30 g 3  . metFORMIN (GLUCOPHAGE) 1000 MG tablet Take 1,000 mg by mouth 2 (two) times daily with a meal.    . ondansetron (ZOFRAN) 8 MG tablet Take 1 tablet (8 mg total) by mouth 2 (two) times daily as needed for refractory nausea / vomiting. Start on day 3 after chemo. 30 tablet 1  . prochlorperazine (COMPAZINE) 10 MG tablet Take 1 tablet (10 mg total) by mouth every 6 (six) hours as needed (Nausea or vomiting). 30 tablet 1  . traMADol (ULTRAM) 50 MG tablet Take 50 mg by mouth every 6 (six) hours as needed for pain.  0   No current facility-administered medications for this visit.     PHYSICAL  EXAMINATION: ECOG PERFORMANCE STATUS: 1 - Symptomatic but completely ambulatory  Vitals:   10/06/16 1033  BP: (!) 138/99  Pulse: 98  Resp: 18  Temp: (!) 97.4 F (36.3 C)  SpO2: 99%   Filed Weights   10/06/16 1033  Weight: 240 lb 14.4 oz (109.3 kg)    GENERAL:alert, no distress and comfortable SKIN: skin color, texture, turgor are normal, no rashes or significant lesions EYES: normal, Conjunctiva are pink and non-injected, sclera clear OROPHARYNX:no exudate, no erythema and lips, buccal mucosa, and tongue normal  NECK: supple, thyroid normal size, non-tender, without nodularity LYMPH:  no palpable lymphadenopathy in the cervical, axillary or inguinal LUNGS: clear to auscultation and percussion with normal breathing effort HEART: regular rate & rhythm and no murmurs and no lower extremity edema ABDOMEN:abdomen soft, non-tender and normal bowel sounds MUSCULOSKELETAL:no cyanosis of digits and no clubbing  NEURO: alert & oriented x 3  with fluent speech, no focal motor/sensory deficits EXTREMITIES: No lower extremity edema  LABORATORY DATA:  I have reviewed the data as listed   Chemistry      Component Value Date/Time   NA 140 10/06/2016 0953   K 4.2 10/06/2016 0953   CL 103 08/19/2016 1351   CO2 24 10/06/2016 0953   BUN 12.8 10/06/2016 0953   CREATININE 0.9 10/06/2016 0953      Component Value Date/Time   CALCIUM 9.6 10/06/2016 0953   ALKPHOS 87 10/06/2016 0953   AST 12 10/06/2016 0953   ALT 14 10/06/2016 0953   BILITOT 0.32 10/06/2016 0953       Lab Results  Component Value Date   WBC 9.0 10/06/2016   HGB 9.4 (L) 10/06/2016   HCT 29.0 (L) 10/06/2016   MCV 80.6 10/06/2016   PLT 315 10/06/2016   NEUTROABS 8.3 (H) 10/06/2016    ASSESSMENT & PLAN:  Breast cancer of upper-outer quadrant of right female breast Bilateral mastectomies 07/24/2016  Left mastectomy: ILC grade 2, 1.4 cm, margins negative, 0/2 lymph nodes negative, T1 CN 0 stage IA, ER 95%, PR 0%,  HER-2 negative, Ki-67 20% Right mastectomy: Benign  Oncotype DX score 26:17% risk of distant recurrence  Recommendation: 1.Adjuvant chemotherapy with Taxotere and Cytoxan every 3 weeks 4 cycles 2. followed by anastrozole 1 mg daily with ovarian suppression (labs on 08/04/2016 showed FSH21.7 and estradiol less than 2.5)  -------------------------------------------------------------------------- Current treatment: Cycle 3 day 1Taxotere and Cytoxan  Chemotherapy toxicities: 1. Mild nausea 2. mild fatigue  Labs again reviewed. Patient has chronic normocytic anemia which is unchanged. Does not have neutropenia. Return to clinic in 3weeksfor cycle 4.   I spent 25 minutes talking to the patient of which more than half was spent in counseling and coordination of care.  No orders of the defined types were placed in this encounter.  The patient has a good understanding of the overall plan. she agrees with it. she will call with any problems that may develop before the next visit here.   Rulon Eisenmenger, MD 10/06/16

## 2016-10-21 ENCOUNTER — Other Ambulatory Visit: Payer: Self-pay

## 2016-10-21 MED ORDER — ANASTROZOLE 1 MG PO TABS: 1.0000 mg | ORAL_TABLET | Freq: Every day | ORAL | 0 refills | Status: DC

## 2016-10-27 ENCOUNTER — Ambulatory Visit: Payer: 59

## 2016-10-27 ENCOUNTER — Other Ambulatory Visit (HOSPITAL_BASED_OUTPATIENT_CLINIC_OR_DEPARTMENT_OTHER): Payer: 59

## 2016-10-27 ENCOUNTER — Ambulatory Visit (HOSPITAL_BASED_OUTPATIENT_CLINIC_OR_DEPARTMENT_OTHER): Payer: 59 | Admitting: Hematology and Oncology

## 2016-10-27 ENCOUNTER — Ambulatory Visit (HOSPITAL_BASED_OUTPATIENT_CLINIC_OR_DEPARTMENT_OTHER): Payer: 59

## 2016-10-27 ENCOUNTER — Telehealth: Payer: Self-pay | Admitting: Hematology and Oncology

## 2016-10-27 ENCOUNTER — Other Ambulatory Visit: Payer: Self-pay

## 2016-10-27 ENCOUNTER — Encounter: Payer: Self-pay | Admitting: Hematology and Oncology

## 2016-10-27 VITALS — BP 157/95 | HR 112 | Temp 98.4°F | Resp 20 | Ht 67.5 in | Wt 241.4 lb

## 2016-10-27 DIAGNOSIS — Z170 Estrogen receptor positive status [ER+]: Secondary | ICD-10-CM

## 2016-10-27 DIAGNOSIS — Z5111 Encounter for antineoplastic chemotherapy: Secondary | ICD-10-CM | POA: Diagnosis not present

## 2016-10-27 DIAGNOSIS — C50411 Malignant neoplasm of upper-outer quadrant of right female breast: Secondary | ICD-10-CM

## 2016-10-27 DIAGNOSIS — C50212 Malignant neoplasm of upper-inner quadrant of left female breast: Secondary | ICD-10-CM

## 2016-10-27 DIAGNOSIS — R530 Neoplastic (malignant) related fatigue: Secondary | ICD-10-CM | POA: Diagnosis not present

## 2016-10-27 DIAGNOSIS — Z95828 Presence of other vascular implants and grafts: Secondary | ICD-10-CM

## 2016-10-27 DIAGNOSIS — Z5189 Encounter for other specified aftercare: Secondary | ICD-10-CM

## 2016-10-27 DIAGNOSIS — D649 Anemia, unspecified: Secondary | ICD-10-CM | POA: Diagnosis not present

## 2016-10-27 LAB — CBC WITH DIFFERENTIAL/PLATELET
BASO%: 1.2 % (ref 0.0–2.0)
BASOS ABS: 0.1 10*3/uL (ref 0.0–0.1)
EOS ABS: 0 10*3/uL (ref 0.0–0.5)
EOS%: 0.1 % (ref 0.0–7.0)
HEMATOCRIT: 31.2 % — AB (ref 34.8–46.6)
HEMOGLOBIN: 10.1 g/dL — AB (ref 11.6–15.9)
LYMPH%: 4.8 % — ABNORMAL LOW (ref 14.0–49.7)
MCH: 26.3 pg (ref 25.1–34.0)
MCHC: 32.5 g/dL (ref 31.5–36.0)
MCV: 80.9 fL (ref 79.5–101.0)
MONO#: 0.4 10*3/uL (ref 0.1–0.9)
MONO%: 3.7 % (ref 0.0–14.0)
NEUT%: 90.2 % — ABNORMAL HIGH (ref 38.4–76.8)
NEUTROS ABS: 9.6 10*3/uL — AB (ref 1.5–6.5)
Platelets: 340 10*3/uL (ref 145–400)
RBC: 3.86 10*6/uL (ref 3.70–5.45)
RDW: 21.4 % — ABNORMAL HIGH (ref 11.2–14.5)
WBC: 10.7 10*3/uL — AB (ref 3.9–10.3)
lymph#: 0.5 10*3/uL — ABNORMAL LOW (ref 0.9–3.3)

## 2016-10-27 LAB — COMPREHENSIVE METABOLIC PANEL
ALBUMIN: 3.9 g/dL (ref 3.5–5.0)
ALK PHOS: 106 U/L (ref 40–150)
ALT: 23 U/L (ref 0–55)
AST: 13 U/L (ref 5–34)
Anion Gap: 10 mEq/L (ref 3–11)
BILIRUBIN TOTAL: 0.24 mg/dL (ref 0.20–1.20)
BUN: 11.7 mg/dL (ref 7.0–26.0)
CALCIUM: 9.5 mg/dL (ref 8.4–10.4)
CO2: 24 mEq/L (ref 22–29)
Chloride: 106 mEq/L (ref 98–109)
Creatinine: 0.8 mg/dL (ref 0.6–1.1)
GLUCOSE: 149 mg/dL — AB (ref 70–140)
Potassium: 4.1 mEq/L (ref 3.5–5.1)
SODIUM: 140 meq/L (ref 136–145)
TOTAL PROTEIN: 7.7 g/dL (ref 6.4–8.3)

## 2016-10-27 MED ORDER — SODIUM CHLORIDE 0.9% FLUSH
10.0000 mL | INTRAVENOUS | Status: DC | PRN
  Administered 2016-10-27: 10 mL via INTRAVENOUS
  Filled 2016-10-27: qty 10

## 2016-10-27 MED ORDER — SODIUM CHLORIDE 0.9 % IV SOLN
Freq: Once | INTRAVENOUS | Status: AC
  Administered 2016-10-27: 13:00:00 via INTRAVENOUS

## 2016-10-27 MED ORDER — PALONOSETRON HCL INJECTION 0.25 MG/5ML
INTRAVENOUS | Status: AC
  Filled 2016-10-27: qty 5

## 2016-10-27 MED ORDER — PALONOSETRON HCL INJECTION 0.25 MG/5ML
0.2500 mg | Freq: Once | INTRAVENOUS | Status: AC
  Administered 2016-10-27: 0.25 mg via INTRAVENOUS

## 2016-10-27 MED ORDER — SODIUM CHLORIDE 0.9% FLUSH
10.0000 mL | INTRAVENOUS | Status: DC | PRN
  Administered 2016-10-27: 10 mL
  Filled 2016-10-27: qty 10

## 2016-10-27 MED ORDER — DEXAMETHASONE SODIUM PHOSPHATE 10 MG/ML IJ SOLN
10.0000 mg | Freq: Once | INTRAMUSCULAR | Status: AC
  Administered 2016-10-27: 10 mg via INTRAVENOUS

## 2016-10-27 MED ORDER — DEXAMETHASONE SODIUM PHOSPHATE 10 MG/ML IJ SOLN
INTRAMUSCULAR | Status: AC
  Filled 2016-10-27: qty 1

## 2016-10-27 MED ORDER — HEPARIN SOD (PORK) LOCK FLUSH 100 UNIT/ML IV SOLN
500.0000 [IU] | Freq: Once | INTRAVENOUS | Status: AC | PRN
  Administered 2016-10-27: 500 [IU]
  Filled 2016-10-27: qty 5

## 2016-10-27 MED ORDER — PEGFILGRASTIM 6 MG/0.6ML ~~LOC~~ PSKT
6.0000 mg | PREFILLED_SYRINGE | Freq: Once | SUBCUTANEOUS | Status: AC
  Administered 2016-10-27: 6 mg via SUBCUTANEOUS
  Filled 2016-10-27: qty 0.6

## 2016-10-27 MED ORDER — SODIUM CHLORIDE 0.9 % IV SOLN
600.0000 mg/m2 | Freq: Once | INTRAVENOUS | Status: AC
  Administered 2016-10-27: 1380 mg via INTRAVENOUS
  Filled 2016-10-27: qty 69

## 2016-10-27 MED ORDER — SODIUM CHLORIDE 0.9 % IV SOLN
75.0000 mg/m2 | Freq: Once | INTRAVENOUS | Status: AC
  Administered 2016-10-27: 170 mg via INTRAVENOUS
  Filled 2016-10-27: qty 17

## 2016-10-27 NOTE — Progress Notes (Signed)
Per Dr.Gudena, add [place] to lab today. Notified infusion RN to draw additional labs today. Lab notified and aware to send tubes/labels to infusion.

## 2016-10-27 NOTE — Telephone Encounter (Signed)
Called patient regarding December appointment

## 2016-10-27 NOTE — Patient Instructions (Signed)
[facility] Discharge Instructions for Patients Receiving Chemotherapy  Today you received the following chemotherapy agents: Taxol, Cytoxan and Neulasta On-Pro application. To help prevent nausea and vomiting after your treatment, we encourage you to take your nausea medication: Compazine. Take one every 6 hours as needed. For nausea on or after 8/30, take Zofran every 8 hours.   If you develop nausea and vomiting that is not controlled by your nausea medication, call the clinic.   BELOW ARE SYMPTOMS THAT SHOULD BE REPORTED IMMEDIATELY:  *FEVER GREATER THAN 100.5 F  *CHILLS WITH OR WITHOUT FEVER  NAUSEA AND VOMITING THAT IS NOT CONTROLLED WITH YOUR NAUSEA MEDICATION  *UNUSUAL SHORTNESS OF BREATH  *UNUSUAL BRUISING OR BLEEDING  TENDERNESS IN MOUTH AND THROAT WITH OR WITHOUT PRESENCE OF ULCERS  *URINARY PROBLEMS  *BOWEL PROBLEMS  UNUSUAL RASH Items with * indicate a potential emergency and should be followed up as soon as possible.  Feel free to call the clinic should you have any questions or concerns. The clinic phone number is [phone].  Please show the [facility] at check-in to the Emergency Department and triage nurse.

## 2016-10-27 NOTE — Assessment & Plan Note (Signed)
Bilateral mastectomies 07/24/2016  Left mastectomy: ILC grade 2, 1.4 cm, margins negative, 0/2 lymph nodes negative, T1 CN 0 stage IA, ER 95%, PR 0%, HER-2 negative, Ki-67 20% Right mastectomy: Benign  Oncotype DX score 26:17% risk of distant recurrence  Recommendation: 1.Adjuvant chemotherapy with Taxotere and Cytoxan every 3 weeks 4 cycles 2. followed by anastrozole 1 mg daily with ovarian suppression (labs on 08/04/2016 showed FSH21.7 and estradiol less than 2.5)  -------------------------------------------------------------------------- Current treatment: Cycle 4 day 1Taxotere and Cytoxan  Chemotherapy toxicities: 1. Mild nausea 2. mild fatigue  Labs again reviewed. [place] will be added to today's labs. If she is menopausal then we can treat her with anastrozole alone. If she is not menopausal then we may have to do either ovarian suppression or use tamoxifen instead.  Patient has chronic normocytic anemia which is slowly improving. Does not have neutropenia. Return to clinic in 3 months for follow-up

## 2016-10-27 NOTE — Progress Notes (Signed)
Patient Care Team: [facility] [place] as PCP - General (Family Medicine) Jovita Kussmaul, MD as Consulting Physician (General Surgery) Nicholas Lose, MD as Consulting Physician (Hematology and Oncology) Gery Pray, MD as Consulting Physician (Radiation Oncology) Rockwell Germany, RN as Registered Nurse Mauro Kaufmann, RN as Registered Nurse Holley Bouche, NP as Nurse Practitioner (Nurse Practitioner) Sylvan Cheese, NP as Nurse Practitioner (Nurse Practitioner)  DIAGNOSIS:  Encounter Diagnosis  Name Primary?  . Malignant neoplasm of upper-outer quadrant of right breast in female, estrogen receptor positive ([place]) Yes    SUMMARY OF ONCOLOGIC HISTORY:   Breast cancer of upper-outer quadrant of right female breast ([place])   04/07/2014 Mammogram    Right breast: calcifications       04/14/2014 Initial Biopsy    Right breast needle biopsy: Mammary carcinoma in situ with calcifications at 9 to 10:00 position ER+ (95%), PR+ (66%), DCIS plus LCIS, heterogeneous somewhat E Cadherin positive some negative      04/26/2014 Breast MRI    Biopsy-proven DCIS is identified in the outer quadrant; ~ 3 cm AP diameter linear clumped NME extends from the medial biopsy cavity posteriorly in the central breast which is suspicious for disease extension      04/26/2014 Clinical Stage    Stage 0: Tis Nx      06/05/2014 Definitive Surgery    Right lumpectomy [name]): ILC, grade 2/3, 0.6 cm, LCIS invasive ca focally present at inferior margin (rt Medial); ILC, grade 2-3, 1.7 cm with neg margins with LCIS (Rt Lateral) ER 100%, PR 99%, Ki 67 6%, HER2/neu neg (ratio 1.14)      06/05/2014 Oncotype testing    Score: 8 (ROR 6%)      06/05/2014 Pathologic Stage    Stage IA: mpT1c pNx      06/23/2014 Surgery    Right breast excision: Benign, 0/3 lymph nodes negative      07/24/2014 - 09/08/2014 Radiation Therapy    Adjuvant RT completed [name]): Right breast 45 Gy  over 25 fractions.  Right breast boost 16 Gy over 8 fractions.  Total dose: 60 Gy.        09/25/2014 -  Anti-estrogen oral therapy    Tamoxifen 20 mg daily (Jetty Berland).  Planned duration of therapy 5-10 years.      11/02/2014 Procedure    Breast/Ovarian panel revealed no clinically significant variant at ATM, BARD1, BRCA1, BRCA2, BRIP1, CDH1, CHEK2, FANCC, MLH1, MSH2, MSH6, NBN, PALB2, PMS2, PTEN, RAD51C, RAD51D, TP53, and XRCC2      11/03/2014 Survivorship    Survivorship visit completed and copy of survivorship care plan provided to patient.      05/07/2016 Breast MRI    Left breast non-mass enhancement spanning 6.9 cm. Small enhancing mass 5.6 cm, no lymph nodes. Right breast normal      05/20/2016 Pathology Results    Left breast biopsy posterior outer: Invasive lobular cancer with LCIS, ER 95%, PR 0%, Ki-67 20%, HER-2 negative ratio 1.25      07/24/2016 Surgery    Left mastectomy: ILC grade 2, 1.4 cm, margins negative, 0/2 lymph nodes negative, T1 CN 0 stage IA, ER 95%, PR 0%, HER-2 negative, Ki-67 20% Right mastectomy: Benign      08/11/2016 Oncotype testing    Oncotype score 26:17% risk of recurrence, high intermediate risk      08/25/2016 -  Chemotherapy    Taxotere and Cytoxan 4 cycles        CHIEF COMPLIANT:  Cycle 4 Taxotere and Cytoxan  INTERVAL HISTORY: [name] [name] is a 51 yr old with above-mentioned history of left breast cancer recurrent disease currently on adjuvant chemotherapy and today's cycle 4 of treatment. Today is her last cycle of chemotherapy. She reports no nausea vomiting. Denies any fevers or chills. Does have mild fatigue.   REVIEW OF SYSTEMS:   Constitutional: Denies fevers, chills or abnormal weight loss Eyes: Denies blurriness of vision Ears, nose, mouth, throat, and face: Denies mucositis or sore throat Respiratory: Denies cough, dyspnea or wheezes Cardiovascular: Denies palpitation, chest discomfort Gastrointestinal:  Denies nausea, heartburn or  change in bowel habits Skin: Denies abnormal skin rashes Lymphatics: Denies new lymphadenopathy or easy bruising Neurological:Denies numbness, tingling or new weaknesses Behavioral/Psych: Mood is stable, no new changes  Extremities: No lower extremity edema  All other systems were reviewed with the patient and are negative.  I have reviewed the past medical history, past surgical history, social history and family history with the patient and they are unchanged from previous note.  ALLERGIES:  is allergic to penicillins; shellfish allergy; and tetracyclines & related.  MEDICATIONS:  Current Outpatient Prescriptions  Medication Sig Dispense Refill  . anastrozole (ARIMIDEX) 1 MG tablet Take 1 tablet (1 mg total) by mouth daily. 90 tablet 0  . atorvastatin (LIPITOR) 10 MG tablet Take 1 tablet (10 mg total) by mouth daily.    . diazepam (VALIUM) 2 MG tablet Take 2 mg by mouth every 8 (eight) hours as needed for muscle spasms.  0  . glimepiride (AMARYL) 4 MG tablet Take 1 tablet (4 mg total) by mouth daily with breakfast.    . metFORMIN (GLUCOPHAGE) 1000 MG tablet Take 1,000 mg by mouth 2 (two) times daily with a meal.    . ondansetron (ZOFRAN) 8 MG tablet Take 1 tablet (8 mg total) by mouth 2 (two) times daily as needed for refractory nausea / vomiting. Start on day 3 after chemo. 30 tablet 1  . prochlorperazine (COMPAZINE) 10 MG tablet Take 1 tablet (10 mg total) by mouth every 6 (six) hours as needed (Nausea or vomiting). 30 tablet 1  . traMADol (ULTRAM) 50 MG tablet Take 50 mg by mouth every 6 (six) hours as needed for pain.  0   No current facility-administered medications for this visit.     PHYSICAL EXAMINATION: ECOG PERFORMANCE STATUS: 1 - Symptomatic but completely ambulatory  Vitals:   10/27/16 1134  BP: (!) 157/95  Pulse: (!) 112  Resp: 20  Temp: 98.4 F (36.9 C)  SpO2: 100%   Filed Weights   10/27/16 1134  Weight: 241 lb 6.4 oz (109.5 kg)    GENERAL:alert, no  distress and comfortable SKIN: skin color, texture, turgor are normal, no rashes or significant lesions EYES: normal, Conjunctiva are pink and non-injected, sclera clear OROPHARYNX:no exudate, no erythema and lips, buccal mucosa, and tongue normal  NECK: supple, thyroid normal size, non-tender, without nodularity LYMPH:  no palpable lymphadenopathy in the cervical, axillary or inguinal LUNGS: clear to auscultation and percussion with normal breathing effort HEART: regular rate & rhythm and no murmurs and no lower extremity edema ABDOMEN:abdomen soft, non-tender and normal bowel sounds MUSCULOSKELETAL:no cyanosis of digits and no clubbing  NEURO: alert & oriented x 3 with fluent speech, no focal motor/sensory deficits EXTREMITIES: No lower extremity edema  LABORATORY DATA:  I have reviewed the data as listed   Chemistry      Component Value Date/Time   NA 140 10/27/2016 1105   K  4.1 10/27/2016 1105   CL 103 08/19/2016 1351   CO2 24 10/27/2016 1105   BUN 11.7 10/27/2016 1105   CREATININE 0.8 10/27/2016 1105      Component Value Date/Time   CALCIUM 9.5 10/27/2016 1105   ALKPHOS 106 10/27/2016 1105   AST 13 10/27/2016 1105   ALT 23 10/27/2016 1105   BILITOT 0.24 10/27/2016 1105       Lab Results  Component Value Date   WBC 10.7 (H) 10/27/2016   HGB 10.1 (L) 10/27/2016   HCT 31.2 (L) 10/27/2016   MCV 80.9 10/27/2016   PLT 340 10/27/2016   NEUTROABS 9.6 (H) 10/27/2016    ASSESSMENT & PLAN:  Breast cancer of upper-outer quadrant of right female breast Bilateral mastectomies 07/24/2016  Left mastectomy: ILC grade 2, 1.4 cm, margins negative, 0/2 lymph nodes negative, T1 CN 0 stage IA, ER 95%, PR 0%, HER-2 negative, Ki-67 20% Right mastectomy: Benign  Oncotype DX score 26:17% risk of distant recurrence  Recommendation: 1.Adjuvant chemotherapy with Taxotere and Cytoxan every 3 weeks 4 cycles 2. followed by anastrozole 1 mg daily with ovarian suppression (labs on  08/04/2016 showed FSH21.7 and estradiol less than 2.5)  -------------------------------------------------------------------------- Current treatment: Cycle 4 day 1Taxotere and Cytoxan  Chemotherapy toxicities: 1. Mild nausea 2. mild fatigue  Labs again reviewed. Campbell will be added to today's labs. If she is menopausal then we can treat her with anastrozole alone. If she is not menopausal then we may have to do either ovarian suppression or use tamoxifen instead.  Patient has chronic normocytic anemia which is slowly improving. Does not have neutropenia. Return to clinic in 3 months for follow-up   I spent 25 minutes talking to the patient of which more than half was spent in counseling and coordination of care.  Orders Placed This Encounter  Procedures  . Follicle stimulating hormone    Standing Status:   Future    Standing Expiration Date:   12/01/2017   The patient has a good understanding of the overall plan. she agrees with it. she will call with any problems that may develop before the next visit here.   Rulon Eisenmenger, MD 10/27/16

## 2016-10-28 ENCOUNTER — Telehealth: Payer: Self-pay | Admitting: Hematology and Oncology

## 2016-10-28 LAB — FOLLICLE STIMULATING HORMONE: FSH: 45.9 m[IU]/mL

## 2016-10-28 NOTE — Telephone Encounter (Signed)
I called the patient informed her that her [name] [name] came back as 44.5 which is menopause.

## 2016-10-31 ENCOUNTER — Ambulatory Visit: Payer: 59 | Admitting: Hematology and Oncology

## 2016-10-31 ENCOUNTER — Other Ambulatory Visit: Payer: 59

## 2016-11-03 ENCOUNTER — Ambulatory Visit: Payer: Self-pay | Admitting: General Surgery

## 2016-11-07 ENCOUNTER — Telehealth: Payer: Self-pay

## 2016-11-07 ENCOUNTER — Encounter (HOSPITAL_COMMUNITY): Payer: Self-pay

## 2016-11-07 ENCOUNTER — Encounter (HOSPITAL_COMMUNITY)
Admission: RE | Admit: 2016-11-07 | Discharge: 2016-11-07 | Disposition: A | Discharge status: Home / Self Care | Payer: 59 | Source: Ambulatory Visit | Attending: General Surgery | Admitting: General Surgery

## 2016-11-07 DIAGNOSIS — Z6837 Body mass index (BMI) 37.0-37.9, adult: Secondary | ICD-10-CM | POA: Diagnosis not present

## 2016-11-07 DIAGNOSIS — Z853 Personal history of malignant neoplasm of breast: Secondary | ICD-10-CM | POA: Diagnosis not present

## 2016-11-07 DIAGNOSIS — Z9013 Acquired absence of bilateral breasts and nipples: Secondary | ICD-10-CM | POA: Diagnosis not present

## 2016-11-07 DIAGNOSIS — Z452 Encounter for adjustment and management of vascular access device: Secondary | ICD-10-CM | POA: Diagnosis not present

## 2016-11-07 DIAGNOSIS — Z880 Allergy status to penicillin: Secondary | ICD-10-CM | POA: Diagnosis not present

## 2016-11-07 DIAGNOSIS — C50912 Malignant neoplasm of unspecified site of left female breast: Secondary | ICD-10-CM | POA: Diagnosis present

## 2016-11-07 DIAGNOSIS — Z79899 Other long term (current) drug therapy: Secondary | ICD-10-CM | POA: Diagnosis not present

## 2016-11-07 DIAGNOSIS — Z170 Estrogen receptor positive status [ER+]: Secondary | ICD-10-CM | POA: Diagnosis not present

## 2016-11-07 DIAGNOSIS — Z7984 Long term (current) use of oral hypoglycemic drugs: Secondary | ICD-10-CM | POA: Diagnosis not present

## 2016-11-07 DIAGNOSIS — Z881 Allergy status to other antibiotic agents status: Secondary | ICD-10-CM | POA: Diagnosis not present

## 2016-11-07 HISTORY — DX: Pneumonia, unspecified organism: J18.9

## 2016-11-07 LAB — HEMOGLOBIN A1C
HEMOGLOBIN A1C: 6.8 % — AB (ref 4.8–5.6)
MEAN PLASMA GLUCOSE: 148.46 mg/dL

## 2016-11-07 LAB — BASIC METABOLIC PANEL
Anion gap: 7 (ref 5–15)
BUN: 10 mg/dL (ref 6–20)
CALCIUM: 8.8 mg/dL — AB (ref 8.9–10.3)
CO2: 27 mmol/L (ref 22–32)
CREATININE: 0.87 mg/dL (ref 0.44–1.00)
Chloride: 108 mmol/L (ref 101–111)
GFR calc Af Amer: >60 mL/min (ref >60)
GLUCOSE: 131 mg/dL — AB (ref 65–99)
Potassium: 4.1 mmol/L (ref 3.5–5.1)
Sodium: 142 mmol/L (ref 135–145)

## 2016-11-07 LAB — CBC
HCT: 30.4 % — ABNORMAL LOW (ref 36.0–46.0)
HEMOGLOBIN: 9.1 g/dL — AB (ref 12.0–15.0)
MCH: 25 pg — AB (ref 26.0–34.0)
MCHC: 29.9 g/dL — AB (ref 30.0–36.0)
MCV: 83.5 fL (ref 78.0–100.0)
PLATELETS: 274 10*3/uL (ref 150–400)
RBC: 3.64 MIL/uL — ABNORMAL LOW (ref 3.87–5.11)
RDW: 21.3 % — AB (ref 11.5–15.5)
WBC: 15.7 10*3/uL — ABNORMAL HIGH (ref 4.0–10.5)

## 2016-11-07 LAB — GLUCOSE, CAPILLARY: GLUCOSE-CAPILLARY: 128 mg/dL — AB (ref 65–99)

## 2016-11-07 NOTE — Progress Notes (Signed)
WBC 15,7, Hgb 9.1, HCT 30.4.  I called Dr Ethlyn Gallery office and gave the results to Abigail Butts at the triage desk, she said she will give the results to [place].

## 2016-11-07 NOTE — Pre-Procedure Instructions (Signed)
[name] [name]  11/07/2016    Your procedure is scheduled on Monday, October 1.  Report to [facility] Admitting at 11:30 A.M.                  Your surgery or procedure is scheduled for 1:30 PM   Call this number if you have problems the morning of surgery: [phone]    Remember:  Do not eat food or drink liquids after midnight Sunday, September 30, with the exception of : Drink the 8 ounce bottle of water given to you at your PAT visit at 9:30 AM  Take these medicines the morning of surgery with A SIP OF WATER:  anastrozole (ARIMIDEX)                May take diazepam (VALIUM) if needed.              STOP taking Aspirin, Aspirin Products (Goody Powder, Excedrin Migraine), Ibuprofen (Advil), Naproxen (Aleve), Vitamins and Herbal Products (ie Fish Oil)              DO NOT take medication for Diabetes the morning of surgery  How to Manage Your Diabetes Before and After Surgery  WHAT DO I DO ABOUT MY DIABETES MEDICATION? Marland Kitchen Do not take oral diabetes medicines (pills) the morning of surgery. .  Why is it important to control my blood sugar before and after surgery? . Improving blood sugar levels before and after surgery helps healing and can limit problems. . A way of improving blood sugar control is eating a healthy diet by: o  Eating less sugar and carbohydrates o  Increasing activity/exercise o  Talking with your doctor about reaching your blood sugar goals . High blood sugars (greater than 180 mg/dL) can raise your risk of infections and slow your recovery, so you will need to focus on controlling your diabetes during the weeks before surgery. . Make sure that the doctor who takes care of your diabetes knows about your planned surgery including the date and location.  How do I manage my blood sugar before surgery? . Check your blood sugar at least 4 times a day, starting 2 days before surgery, to make sure that the level is not too high or low. o Check your blood sugar  the morning of your surgery when you wake up and every 2 hours until you get to the Short Stay unit. . If your blood sugar is less than 70 mg/dL, you will need to treat for low blood sugar: o Do not take insulin. o Treat a low blood sugar (less than 70 mg/dL) with  cup of clear juice (cranberry or apple), 4 glucose tablets, OR glucose gel. o Recheck blood sugar in 15 minutes after treatment (to make sure it is greater than 70 mg/dL). If your blood sugar is not greater than 70 mg/dL on recheck, call [phone] for further instructions. . Report your blood sugar to the short stay nurse when you get to Short Stay.  . If you are admitted to the hospital after surgery: o Your blood sugar will be checked by the staff and you will probably be given insulin after surgery (instead of oral diabetes medicines) to make sure you have good blood sugar levels. o The goal for blood sugar control after surgery is 80-180 mg/dL.  Special Instructions:  [facility]- Preparing For Surgery  Before surgery, you can play an important role. Because skin is not sterile, your skin needs  to be as free of germs as possible. You can reduce the number of germs on your skin by washing with CHG (chlorahexidine gluconate) Soap before surgery.  CHG is an antiseptic cleaner which kills germs and bonds with the skin to continue killing germs even after washing.  Please do not use if you have an allergy to CHG or antibacterial soaps. If your skin becomes reddened/irritated stop using the CHG.  Do not shave (including legs and underarms) for at least 48 hours prior to first CHG shower. It is OK to shave your face.  Please follow these instructions carefully.   1. Shower the NIGHT BEFORE SURGERY and the MORNING OF SURGERY with CHG.   2. If you chose to wash your hair, wash your hair first as usual with your normal shampoo.  3. After you shampoo, rinse your hair and body thoroughly to remove the shampoo. 4.  Wash your face and  private areas with your regular home soap and rinse. 5.  6. Use CHG as you would any other liquid soap. You can apply CHG directly to the skin and wash gently with a scrungie or a clean washcloth.   7. Apply the CHG Soap to your body ONLY FROM THE NECK DOWN.  Do not use on open wounds or open sores. Avoid contact with your eyes, ears, mouth and genitals (private parts). Wash genitals (private parts) with your normal soap.  8. Wash thoroughly, paying special attention to the area where your surgery will be performed.  9. Thoroughly rinse your body with warm water from the neck down.  10. DO NOT shower/wash with your normal soap after using and rinsing off the CHG Soap.  11. Pat yourself dry with a CLEAN TOWEL.   12. Wear CLEAN PAJAMAS   13. Place CLEAN SHEETS on your bed the night of your first shower and DO NOT SLEEP WITH PETS.  Day of Surgery: Shower as above Do not apply any deodorants/lotions, powders or colognes. Please wear clean clothes to the hospital/surgery center.               Do not wear jewelry, make-up or nail polish.  Do not shave 48 hours prior to surgery.  Men may shave face and neck.  Do not bring valuables to the hospital.  [facility] is not responsible for any belongings or valuables.  Contacts, dentures or bridgework may not be worn into surgery.  Leave your suitcase in the car.  After surgery it may be brought to your room.  For patients admitted to the hospital, discharge time will be determined by your treatment team.  Patients discharged the day of surgery will not be allowed to drive home.   Please read over the following fact sheets that you were given. Pain Booklet, Coughing and Deep and Breathing, Surgical Site Infections.    Patient Signature:  Date:   Nurse Signature:  Date:

## 2016-11-07 NOTE — Pre-Procedure Instructions (Signed)
[name] [name]  11/07/2016    Your procedure is scheduled on Monday, October 1.  Report to [facility] Admitting at 11:30 A.M.                  Your surgery or procedure is scheduled for 1:30 PM   Call this number if you have problems the morning of surgery: [phone]    Remember:  Do not eat food or drink liquids after midnight Sunday, September 30  Take these medicines the morning of surgery with A SIP OF WATER:  anastrozole (ARIMIDEX)                May take diazepam (VALIUM) if needed.              STOP taking Aspirin, Aspirin Products (Goody Powder, Excedrin Migraine), Ibuprofen (Advil), Naproxen (Aleve), Vitamins and Herbal Products (ie Fish Oil)              DO NOT take medication for Diabetes the morning of surgery  How to Manage Your Diabetes Before and After Surgery  WHAT DO I DO ABOUT MY DIABETES MEDICATION? Marland Kitchen Do not take oral diabetes medicines (pills) the morning of surgery. .  Why is it important to control my blood sugar before and after surgery? . Improving blood sugar levels before and after surgery helps healing and can limit problems. . A way of improving blood sugar control is eating a healthy diet by: o  Eating less sugar and carbohydrates o  Increasing activity/exercise o  Talking with your doctor about reaching your blood sugar goals . High blood sugars (greater than 180 mg/dL) can raise your risk of infections and slow your recovery, so you will need to focus on controlling your diabetes during the weeks before surgery. . Make sure that the doctor who takes care of your diabetes knows about your planned surgery including the date and location.  How do I manage my blood sugar before surgery? . Check your blood sugar at least 4 times a day, starting 2 days before surgery, to make sure that the level is not too high or low. o Check your blood sugar the morning of your surgery when you wake up and every 2 hours until you get to the Short Stay  unit. . If your blood sugar is less than 70 mg/dL, you will need to treat for low blood sugar: o Do not take insulin. o Treat a low blood sugar (less than 70 mg/dL) with  cup of clear juice (cranberry or apple), 4 glucose tablets, OR glucose gel. o Recheck blood sugar in 15 minutes after treatment (to make sure it is greater than 70 mg/dL). If your blood sugar is not greater than 70 mg/dL on recheck, call [phone] for further instructions. . Report your blood sugar to the short stay nurse when you get to Short Stay.  . If you are admitted to the hospital after surgery: o Your blood sugar will be checked by the staff and you will probably be given insulin after surgery (instead of oral diabetes medicines) to make sure you have good blood sugar levels. o The goal for blood sugar control after surgery is 80-180 mg/dL.  Special Instructions:  [facility]- Preparing For Surgery  Before surgery, you can play an important role. Because skin is not sterile, your skin needs to be as free of germs as possible. You can reduce the number of germs on your skin by washing with CHG (  chlorahexidine gluconate) Soap before surgery.  CHG is an antiseptic cleaner which kills germs and bonds with the skin to continue killing germs even after washing.  Please do not use if you have an allergy to CHG or antibacterial soaps. If your skin becomes reddened/irritated stop using the CHG.  Do not shave (including legs and underarms) for at least 48 hours prior to first CHG shower. It is OK to shave your face.  Please follow these instructions carefully.   1. Shower the NIGHT BEFORE SURGERY and the MORNING OF SURGERY with CHG.   2. If you chose to wash your hair, wash your hair first as usual with your normal shampoo.  3. After you shampoo, rinse your hair and body thoroughly to remove the shampoo. 4.  Wash your face and private areas with your regular home soap and rinse. 5.  6. Use CHG as you would any other  liquid soap. You can apply CHG directly to the skin and wash gently with a scrungie or a clean washcloth.   7. Apply the CHG Soap to your body ONLY FROM THE NECK DOWN.  Do not use on open wounds or open sores. Avoid contact with your eyes, ears, mouth and genitals (private parts). Wash genitals (private parts) with your normal soap.  8. Wash thoroughly, paying special attention to the area where your surgery will be performed.  9. Thoroughly rinse your body with warm water from the neck down.  10. DO NOT shower/wash with your normal soap after using and rinsing off the CHG Soap.  11. Pat yourself dry with a CLEAN TOWEL.   12. Wear CLEAN PAJAMAS   13. Place CLEAN SHEETS on your bed the night of your first shower and DO NOT SLEEP WITH PETS.  Day of Surgery: Shower as above Do not apply any deodorants/lotions, powders or colognes. Please wear clean clothes to the hospital/surgery center.               Do not wear jewelry, make-up or nail polish.  Do not shave 48 hours prior to surgery.  Men may shave face and neck.  Do not bring valuables to the hospital.  [facility] is not responsible for any belongings or valuables.  Contacts, dentures or bridgework may not be worn into surgery.  Leave your suitcase in the car.  After surgery it may be brought to your room.  For patients admitted to the hospital, discharge time will be determined by your treatment team.  Patients discharged the day of surgery will not be allowed to drive home.   Please read over the following fact sheets that you were given. Pain Booklet, Coughing and Deep and Breathing, Surgical Site Infections.    Patient Signature:  Date:   Nurse Signature:  Date:

## 2016-11-07 NOTE — Telephone Encounter (Signed)
Spoke with Briar and informed her per Dr. Lindi Adie that she could start her anastrozole right away. Also made her aware that I mentioned the fundraiser to Dr. Lindi Adie and he will be sending her more tickets.  Cyndia Bent RN

## 2016-11-10 ENCOUNTER — Ambulatory Visit (HOSPITAL_COMMUNITY): Payer: 59 | Admitting: Anesthesiology

## 2016-11-10 ENCOUNTER — Ambulatory Visit (HOSPITAL_COMMUNITY)
Admission: RE | Admit: 2016-11-10 | Discharge: 2016-11-10 | Disposition: A | Discharge status: Home / Self Care | Payer: 59 | Source: Ambulatory Visit | Attending: General Surgery | Admitting: General Surgery

## 2016-11-10 ENCOUNTER — Encounter (HOSPITAL_COMMUNITY): Payer: Self-pay

## 2016-11-10 ENCOUNTER — Encounter (HOSPITAL_COMMUNITY)
Admission: RE | Disposition: A | Discharge status: Home / Self Care | Payer: Self-pay | Source: Ambulatory Visit | Attending: General Surgery

## 2016-11-10 DIAGNOSIS — Z79899 Other long term (current) drug therapy: Secondary | ICD-10-CM | POA: Insufficient documentation

## 2016-11-10 DIAGNOSIS — Z853 Personal history of malignant neoplasm of breast: Secondary | ICD-10-CM | POA: Insufficient documentation

## 2016-11-10 DIAGNOSIS — Z9013 Acquired absence of bilateral breasts and nipples: Secondary | ICD-10-CM | POA: Insufficient documentation

## 2016-11-10 DIAGNOSIS — C50212 Malignant neoplasm of upper-inner quadrant of left female breast: Secondary | ICD-10-CM | POA: Diagnosis not present

## 2016-11-10 DIAGNOSIS — Z881 Allergy status to other antibiotic agents status: Secondary | ICD-10-CM | POA: Insufficient documentation

## 2016-11-10 DIAGNOSIS — C50411 Malignant neoplasm of upper-outer quadrant of right female breast: Secondary | ICD-10-CM | POA: Diagnosis not present

## 2016-11-10 DIAGNOSIS — Z880 Allergy status to penicillin: Secondary | ICD-10-CM | POA: Insufficient documentation

## 2016-11-10 DIAGNOSIS — Z452 Encounter for adjustment and management of vascular access device: Secondary | ICD-10-CM | POA: Diagnosis not present

## 2016-11-10 DIAGNOSIS — Z170 Estrogen receptor positive status [ER+]: Secondary | ICD-10-CM | POA: Insufficient documentation

## 2016-11-10 DIAGNOSIS — Z6837 Body mass index (BMI) 37.0-37.9, adult: Secondary | ICD-10-CM | POA: Insufficient documentation

## 2016-11-10 DIAGNOSIS — E119 Type 2 diabetes mellitus without complications: Secondary | ICD-10-CM | POA: Insufficient documentation

## 2016-11-10 DIAGNOSIS — Z7984 Long term (current) use of oral hypoglycemic drugs: Secondary | ICD-10-CM | POA: Insufficient documentation

## 2016-11-10 HISTORY — PX: PORT-A-CATH REMOVAL: SHX5289

## 2016-11-10 LAB — GLUCOSE, CAPILLARY
GLUCOSE-CAPILLARY: 90 mg/dL (ref 65–99)
Glucose-Capillary: 132 mg/dL — ABNORMAL HIGH (ref 65–99)

## 2016-11-10 LAB — HEMOGLOBIN: Hemoglobin: 9.1 g/dL — ABNORMAL LOW (ref 12.0–15.0)

## 2016-11-10 SURGERY — REMOVAL PORT-A-CATH
Anesthesia: General | Site: Chest

## 2016-11-10 MED ORDER — LIDOCAINE-EPINEPHRINE (PF) 1 %-1:200000 IJ SOLN
INTRAMUSCULAR | Status: DC | PRN
  Administered 2016-11-10: 10 mL

## 2016-11-10 MED ORDER — LIDOCAINE-EPINEPHRINE (PF) 1 %-1:200000 IJ SOLN
INTRAMUSCULAR | Status: AC
  Filled 2016-11-10: qty 30

## 2016-11-10 MED ORDER — MIDAZOLAM HCL 5 MG/5ML IJ SOLN
INTRAMUSCULAR | Status: DC | PRN
  Administered 2016-11-10: 2 mg via INTRAVENOUS

## 2016-11-10 MED ORDER — KETOROLAC TROMETHAMINE 30 MG/ML IJ SOLN: 30.0000 mg | Freq: Once | INTRAMUSCULAR | Status: DC | PRN

## 2016-11-10 MED ORDER — GABAPENTIN 300 MG PO CAPS
300.0000 mg | ORAL_CAPSULE | ORAL | Status: AC
  Administered 2016-11-10: 300 mg via ORAL
  Filled 2016-11-10: qty 1

## 2016-11-10 MED ORDER — HYDROCODONE-ACETAMINOPHEN 5-325 MG PO TABS: 1.0000 | ORAL_TABLET | ORAL | 0 refills | Status: DC | PRN

## 2016-11-10 MED ORDER — ONDANSETRON HCL 4 MG/2ML IJ SOLN: 4.0000 mg | Freq: Once | INTRAMUSCULAR | Status: DC | PRN

## 2016-11-10 MED ORDER — PROPOFOL 10 MG/ML IV BOLUS
INTRAVENOUS | Status: DC | PRN
  Administered 2016-11-10: 200 mg via INTRAVENOUS

## 2016-11-10 MED ORDER — DEXAMETHASONE SODIUM PHOSPHATE 10 MG/ML IJ SOLN
INTRAMUSCULAR | Status: DC | PRN
  Administered 2016-11-10: 10 mg via INTRAVENOUS

## 2016-11-10 MED ORDER — LIDOCAINE 2% (20 MG/ML) 5 ML SYRINGE
INTRAMUSCULAR | Status: AC
  Filled 2016-11-10: qty 5

## 2016-11-10 MED ORDER — FENTANYL CITRATE (PF) 100 MCG/2ML IJ SOLN: 25.0000 ug | INTRAMUSCULAR | Status: DC | PRN

## 2016-11-10 MED ORDER — MEPERIDINE HCL 25 MG/ML IJ SOLN: 6.2500 mg | INTRAMUSCULAR | Status: DC | PRN

## 2016-11-10 MED ORDER — MIDAZOLAM HCL 2 MG/2ML IJ SOLN
INTRAMUSCULAR | Status: AC
  Filled 2016-11-10: qty 2

## 2016-11-10 MED ORDER — FENTANYL CITRATE (PF) 250 MCG/5ML IJ SOLN
INTRAMUSCULAR | Status: AC
  Filled 2016-11-10: qty 5

## 2016-11-10 MED ORDER — 0.9 % SODIUM CHLORIDE (POUR BTL) OPTIME
TOPICAL | Status: DC | PRN
  Administered 2016-11-10: 1000 mL

## 2016-11-10 MED ORDER — CHLORHEXIDINE GLUCONATE CLOTH 2 % EX PADS: 6.0000 | MEDICATED_PAD | Freq: Once | CUTANEOUS | Status: DC

## 2016-11-10 MED ORDER — CELECOXIB 200 MG PO CAPS
200.0000 mg | ORAL_CAPSULE | ORAL | Status: AC
  Administered 2016-11-10: 200 mg via ORAL
  Filled 2016-11-10: qty 1

## 2016-11-10 MED ORDER — LACTATED RINGERS IV SOLN
INTRAVENOUS | Status: DC
  Administered 2016-11-10: 12:00:00 via INTRAVENOUS

## 2016-11-10 MED ORDER — FENTANYL CITRATE (PF) 100 MCG/2ML IJ SOLN
INTRAMUSCULAR | Status: DC | PRN
  Administered 2016-11-10 (×2): 50 ug via INTRAVENOUS

## 2016-11-10 MED ORDER — ONDANSETRON HCL 4 MG/2ML IJ SOLN
INTRAMUSCULAR | Status: DC | PRN
  Administered 2016-11-10: 4 mg via INTRAVENOUS

## 2016-11-10 MED ORDER — LIDOCAINE-EPINEPHRINE 1 %-1:100000 IJ SOLN
INTRAMUSCULAR | Status: AC
  Filled 2016-11-10: qty 1

## 2016-11-10 MED ORDER — DEXAMETHASONE SODIUM PHOSPHATE 10 MG/ML IJ SOLN
INTRAMUSCULAR | Status: AC
  Filled 2016-11-10: qty 1

## 2016-11-10 MED ORDER — ONDANSETRON HCL 4 MG/2ML IJ SOLN
INTRAMUSCULAR | Status: AC
  Filled 2016-11-10: qty 2

## 2016-11-10 MED ORDER — PROPOFOL 10 MG/ML IV BOLUS
INTRAVENOUS | Status: AC
  Filled 2016-11-10: qty 20

## 2016-11-10 MED ORDER — LIDOCAINE 2% (20 MG/ML) 5 ML SYRINGE
INTRAMUSCULAR | Status: DC | PRN
  Administered 2016-11-10: 100 mg via INTRAVENOUS

## 2016-11-10 MED ORDER — ACETAMINOPHEN 500 MG PO TABS
1000.0000 mg | ORAL_TABLET | ORAL | Status: AC
  Administered 2016-11-10: 1000 mg via ORAL
  Filled 2016-11-10: qty 2

## 2016-11-10 SURGICAL SUPPLY — 31 items
ADH SKN CLS APL DERMABOND .7 (GAUZE/BANDAGES/DRESSINGS) ×1
CHLORAPREP W/TINT 10.5 ML (MISCELLANEOUS) ×2 IMPLANT
COVER SURGICAL LIGHT HANDLE (MISCELLANEOUS) ×2 IMPLANT
DERMABOND ADVANCED (GAUZE/BANDAGES/DRESSINGS) ×1
DERMABOND ADVANCED .7 DNX12 (GAUZE/BANDAGES/DRESSINGS) ×1 IMPLANT
DRAPE LAPAROTOMY 100X72 PEDS (DRAPES) ×2 IMPLANT
DRAPE UTILITY XL STRL (DRAPES) ×2 IMPLANT
ELECT CAUTERY BLADE 6.4 (BLADE) ×2 IMPLANT
ELECT REM PT RETURN 9FT ADLT (ELECTROSURGICAL) ×2
ELECTRODE REM PT RTRN 9FT ADLT (ELECTROSURGICAL) ×1 IMPLANT
GAUZE SPONGE 4X4 16PLY XRAY LF (GAUZE/BANDAGES/DRESSINGS) ×2 IMPLANT
GLOVE BIO SURGEON STRL SZ7.5 (GLOVE) ×2 IMPLANT
GLOVE BIOGEL PI IND STRL 6.5 (GLOVE) IMPLANT
GLOVE BIOGEL PI INDICATOR 6.5 (GLOVE) ×2
GLOVE SURG SS PI 6.0 STRL IVOR (GLOVE) ×1 IMPLANT
GOWN STRL REUS W/ TWL LRG LVL3 (GOWN DISPOSABLE) ×2 IMPLANT
GOWN STRL REUS W/TWL LRG LVL3 (GOWN DISPOSABLE) ×4
KIT BASIN OR (CUSTOM PROCEDURE TRAY) ×2 IMPLANT
KIT ROOM TURNOVER OR (KITS) ×2 IMPLANT
NDL HYPO 25GX1X1/2 BEV (NEEDLE) ×1 IMPLANT
NEEDLE HYPO 25GX1X1/2 BEV (NEEDLE) ×2 IMPLANT
NS IRRIG 1000ML POUR BTL (IV SOLUTION) ×2 IMPLANT
PACK SURGICAL SETUP 50X90 (CUSTOM PROCEDURE TRAY) ×2 IMPLANT
PAD ARMBOARD 7.5X6 YLW CONV (MISCELLANEOUS) ×4 IMPLANT
PENCIL BUTTON HOLSTER BLD 10FT (ELECTRODE) ×2 IMPLANT
SUT MNCRL AB 4-0 PS2 18 (SUTURE) ×2 IMPLANT
SUT VIC AB 3-0 SH 27 (SUTURE) ×2
SUT VIC AB 3-0 SH 27X BRD (SUTURE) ×1 IMPLANT
SYR CONTROL 10ML LL (SYRINGE) ×2 IMPLANT
TOWEL OR 17X24 6PK STRL BLUE (TOWEL DISPOSABLE) ×2 IMPLANT
TOWEL OR 17X26 10 PK STRL BLUE (TOWEL DISPOSABLE) ×2 IMPLANT

## 2016-11-10 NOTE — Interval H&P Note (Signed)
History and Physical Interval Note:  11/10/2016 1:10 PM  [name] [name]  has presented today for surgery, with the diagnosis of h/o breast cancer  The various methods of treatment have been discussed with the patient and family. After consideration of risks, benefits and other options for treatment, the patient has consented to  Procedure(s): REMOVAL PORT-A-CATH (N/A) as a surgical intervention .  The patient's history has been reviewed, patient examined, no change in status, stable for surgery.  I have reviewed the patient's chart and labs.  Questions were answered to the patient's satisfaction.     TOTH III,Darby Shadwick S

## 2016-11-10 NOTE — H&P (Signed)
  [name] [name]  Location: [facility] Patient #: [id number] DOB: 1965/07/31 Married / Language: English / Race: Black or African American Female   History of Present Illness The patient is a 51 year old female who presents for a follow-up for Breast cancer. The patient is a 51 year old white female who is 2 years status post right lumpectomy and sentinel node mapping for a T1 cN0 right breast cancer. She was ER and PR positive and HER-2 negative with a Ki 67 of 60%. She is now about 2 weeks status post right prophylactic mastectomy and left mastectomy and sentinel node mapping for a T1 cN0 lobular left breast cancer. It was ER positive and PR negative and HER-2 negative with a Ki-67 of 20%. She tolerated the surgery well. She did begin the reconstruction process. She complains only of some mild pain at the operative site. Her margins were clean and her nodes were negative.   Allergies Tetracycline *CHEMICALS*  Penicillin G Pot in Dextrose *PENICILLINS*   Medication History Arimidex ('1MG'$  Tablet, Oral) Active. Illusions C Breast Prosthesis (1 (one) Misc as needed, Taken starting 07/16/2016) Active. (Mastectomy Products) Benzonatate ('100MG'$  Capsule, Oral) Active. Glimepiride ('4MG'$  Tablet, Oral) Active. Atorvastatin Calcium ('10MG'$  Tablet, Oral) Active. LevoFLOXacin ('750MG'$  Tablet, Oral) Active. MetFORMIN HCl ('500MG'$  Tablet, Oral every 4 to 6 hours as needed for pain) Active. Medications Reconciled  Vitals  Weight: 245 lb Height: 67.5in Body Surface Area: 2.22 m Body Mass Index: 37.81 kg/m  Temp.: 36F(Oral)  Pulse: 92 (Regular)  BP: 122/76 (Sitting, Left Wrist, Standard)       Physical Exam GEN: no distress.  Breast Note: Both mastectomy incisions are healing nicely with no sign of infection or seroma. The skin flaps are healthy. Lungs: clear bilaterally CV: regular rate and rhythm     Assessment & Plan  PRIMARY CANCER OF LEFT FEMALE  BREAST (C50.912) Impression: The patient is about 2 weeks status post bilateral mastectomies for a left sided lobular breast cancer. She had reconstruction done by Dr. Marla Roe. She has tolerated the surgery well. At this point she will continue to follow-up with plastics. I will plan to see her back in about 3 months to check her progress. Current Plans Follow up with [place] in the office in 3 MONTHS.  Call us sooner as needed.

## 2016-11-10 NOTE — Anesthesia Procedure Notes (Signed)
Procedure Name: LMA Insertion Date/Time: 11/10/2016 1:41 PM Performed by: Luciana Axe K Pre-anesthesia Checklist: Patient identified, Emergency Drugs available, Suction available and Patient being monitored Patient Re-evaluated:Patient Re-evaluated prior to induction Oxygen Delivery Method: Circle System Utilized Preoxygenation: Pre-oxygenation with 100% oxygen Induction Type: IV induction Ventilation: Mask ventilation without difficulty LMA: LMA inserted LMA Size: 4.0 Number of attempts: 1 Airway Equipment and Method: Bite block Placement Confirmation: positive ETCO2 and breath sounds checked- equal and bilateral Tube secured with: Tape Dental Injury: Teeth and Oropharynx as per pre-operative assessment

## 2016-11-10 NOTE — Anesthesia Preprocedure Evaluation (Addendum)
Anesthesia Evaluation  Patient identified by MRN, date of birth, ID band Patient awake    Reviewed: Allergy & Precautions, H&P , NPO status , Patient's Chart, lab work & pertinent test results  Airway Mallampati: II  TM Distance: >3 FB Neck ROM: Full    Dental no notable dental hx. (+) Teeth Intact   Pulmonary    Pulmonary exam normal breath sounds clear to auscultation       Cardiovascular negative cardio ROS Normal cardiovascular exam Rhythm:Regular Rate:Normal     Neuro/Psych negative neurological ROS  negative psych ROS   GI/Hepatic negative GI ROS, Neg liver ROS,   Endo/Other  negative endocrine ROSdiabetes, Type 2, Oral Hypoglycemic AgentsMorbid obesity  Renal/GU negative Renal ROS  negative genitourinary   Musculoskeletal negative musculoskeletal ROS (+)   Abdominal (+) + obese,   Peds negative pediatric ROS (+)  Hematology negative hematology ROS (+)   Anesthesia Other Findings   Reproductive/Obstetrics negative OB ROS                             Anesthesia Physical  Anesthesia Plan  ASA: III  Anesthesia Plan: General   Post-op Pain Management:  Regional for Post-op pain   Induction: Intravenous  PONV Risk Score and Plan: 3 and Ondansetron, Dexamethasone, Midazolam and Propofol  Airway Management Planned: LMA  Additional Equipment:   Intra-op Plan:   Post-operative Plan:   Informed Consent: I have reviewed the patients History and Physical, chart, labs and discussed the procedure including the risks, benefits and alternatives for the proposed anesthesia with the patient or authorized representative who has indicated his/her understanding and acceptance.   Dental advisory given  Plan Discussed with: CRNA and Surgeon  Anesthesia Plan Comments: (  )        Anesthesia Quick Evaluation                                   Anesthesia Evaluation  Patient  identified by MRN, date of birth, ID band Patient awake    Reviewed: Allergy & Precautions, H&P , NPO status , Patient's Chart, lab work & pertinent test results, reviewed documented beta blocker date and time   Airway Mallampati: II  TM Distance: >3 FB Neck ROM: Full    Dental no notable dental hx.    Pulmonary neg pulmonary ROS,    Pulmonary exam normal breath sounds clear to auscultation       Cardiovascular negative cardio ROS Normal cardiovascular exam Rhythm:Regular Rate:Normal     Neuro/Psych negative neurological ROS  negative psych ROS   GI/Hepatic negative GI ROS, Neg liver ROS,   Endo/Other  diabetes, Type 2Morbid obesity  Renal/GU negative Renal ROS  negative genitourinary   Musculoskeletal negative musculoskeletal ROS (+)   Abdominal   Peds negative pediatric ROS (+)  Hematology negative hematology ROS (+)   Anesthesia Other Findings   Reproductive/Obstetrics negative OB ROS                             Anesthesia Physical  Anesthesia Plan  ASA: III  Anesthesia Plan: General   Post-op Pain Management:  Regional for Post-op pain   Induction: Intravenous  PONV Risk Score and Plan: 2 and Ondansetron, Dexamethasone and Propofol  Airway Management Planned: Oral ETT  Additional Equipment:   Intra-op Plan:  Post-operative Plan: Extubation in OR  Informed Consent: I have reviewed the patients History and Physical, chart, labs and discussed the procedure including the risks, benefits and alternatives for the proposed anesthesia with the patient or authorized representative who has indicated his/her understanding and acceptance.   Dental advisory given  Plan Discussed with: CRNA and Surgeon  Anesthesia Plan Comments: (  )       Anesthesia Quick Evaluation

## 2016-11-10 NOTE — Op Note (Signed)
11/10/2016  2:13 PM  PATIENT:  [name] [name]  51 y.o. female  PRE-OPERATIVE DIAGNOSIS:  history of breast cancer  POST-OPERATIVE DIAGNOSIS:  history of breast cancer  PROCEDURE:  Procedure(s): REMOVAL PORT-A-CATH (N/A)  SURGEON:  Surgeon(s) and Role:    * Jovita Kussmaul, MD - Primary  PHYSICIAN ASSISTANT:   ASSISTANTS: none   ANESTHESIA:   local and general  EBL:  Total I/O In: 500 [I.V.:500] Out: -   BLOOD ADMINISTERED:none  DRAINS: none   LOCAL MEDICATIONS USED:  LIDOCAINE   SPECIMEN:  No Specimen  DISPOSITION OF SPECIMEN:  N/A  COUNTS:  YES  TOURNIQUET:  * No tourniquets in log *  DICTATION: .Dragon Dictation   After informed consent was obtained the patient was brought to the operating room and placed in the supine position on the operating table. After adequate induction of general anesthesia the patient's left chest was prepped with ChloraPrep, allowed to dry, and draped in usual sterile manner. An appropriate timeout was performed. The area around the port was infiltrated with 1% lidocaine. A small transversely oriented incision was made through her previous incision with a 15 blade knife. The incision was carried through the subcutaneous tissue sharply with a 15 blade knife until the capsule surrounding the port was opened. The 2 anchoring stitches were divided and removed. The port was then gently pushed out of its pocket and then with gentle traction was removed from the patient without difficulty. Pressure was held for several minutes until the area was completely hemostatic. The deep layer of the wound was then closed with interrupted 3-0 Vicryl stitches. The skin was then closed with a running 4-0 Monocryl subcuticular stitch. Dermabond dressings were applied. The patient tolerated the procedure well. At the end of the case all needle sponge and instrument counts were correct. The patient was then awakened and taken to recovery in stable condition.  PLAN OF CARE:  Discharge to home after PACU  PATIENT DISPOSITION:  PACU - hemodynamically stable.   Delay start of Pharmacological VTE agent (>24hrs) due to surgical blood loss or risk of bleeding: not applicable

## 2016-11-10 NOTE — Transfer of Care (Signed)
Immediate Anesthesia Transfer of Care Note  Patient: [name] [name]  Procedure(s) Performed: REMOVAL PORT-A-CATH (N/A Chest)  Patient Location: PACU  Anesthesia Type:General  Level of Consciousness: awake, alert , oriented and patient cooperative  Airway & Oxygen Therapy: Patient Spontanous Breathing and Patient connected to nasal cannula oxygen  Post-op Assessment: Report given to RN and Post -op Vital signs reviewed and stable  Post vital signs: Reviewed  Last Vitals:  Vitals:   11/10/16 1159 11/10/16 1422  BP: (!) 146/92 (!) (P) 156/93  Pulse: 92   Resp: 18   Temp: 36.8 C (P) 36.4 C  SpO2: 100%     Last Pain:  Vitals:   11/10/16 1159  TempSrc: Oral      Patients Stated Pain Goal: 3 (38/17/71 1657)  Complications: No apparent anesthesia complications

## 2016-11-10 NOTE — Progress Notes (Signed)
reportt given to steve shaw rn as caregiver

## 2016-11-11 ENCOUNTER — Encounter (HOSPITAL_COMMUNITY): Payer: Self-pay | Admitting: General Surgery

## 2016-11-11 NOTE — Anesthesia Postprocedure Evaluation (Signed)
Anesthesia Post Note  Patient: [name] [name]  Procedure(s) Performed: REMOVAL PORT-A-CATH (N/A Chest)     Patient location during evaluation: PACU Anesthesia Type: General Level of consciousness: awake Pain management: pain level controlled Vital Signs Assessment: post-procedure vital signs reviewed and stable Respiratory status: spontaneous breathing Cardiovascular status: stable Postop Assessment: no apparent nausea or vomiting Anesthetic complications: no    Last Vitals:  Vitals:   11/10/16 1455 11/10/16 1510  BP: (!) 143/71 (!) 154/73  Pulse: 72 82  Resp: (!) 6 15  Temp: (!) 36.4 C   SpO2: 96% 94%    Last Pain:  Vitals:   11/10/16 1422  TempSrc:   PainSc: 0-No pain   Pain Goal: Patients Stated Pain Goal: 3 (11/10/16 1159)               Brutus

## 2016-12-26 DIAGNOSIS — C50919 Malignant neoplasm of unspecified site of unspecified female breast: Secondary | ICD-10-CM | POA: Diagnosis not present

## 2016-12-26 DIAGNOSIS — E119 Type 2 diabetes mellitus without complications: Secondary | ICD-10-CM | POA: Diagnosis not present

## 2017-01-08 ENCOUNTER — Encounter (HOSPITAL_BASED_OUTPATIENT_CLINIC_OR_DEPARTMENT_OTHER): Payer: Self-pay | Admitting: *Deleted

## 2017-01-09 ENCOUNTER — Encounter (HOSPITAL_BASED_OUTPATIENT_CLINIC_OR_DEPARTMENT_OTHER)
Admission: RE | Admit: 2017-01-09 | Discharge: 2017-01-09 | Disposition: A | Discharge status: Home / Self Care | Payer: 59 | Source: Ambulatory Visit | Attending: Plastic Surgery | Admitting: Plastic Surgery

## 2017-01-09 ENCOUNTER — Ambulatory Visit: Payer: Self-pay | Admitting: Plastic Surgery

## 2017-01-09 DIAGNOSIS — Z9013 Acquired absence of bilateral breasts and nipples: Secondary | ICD-10-CM | POA: Diagnosis not present

## 2017-01-09 DIAGNOSIS — Z01818 Encounter for other preprocedural examination: Secondary | ICD-10-CM | POA: Insufficient documentation

## 2017-01-09 LAB — BASIC METABOLIC PANEL
ANION GAP: 8 (ref 5–15)
BUN: 14 mg/dL (ref 6–20)
CHLORIDE: 104 mmol/L (ref 101–111)
CO2: 28 mmol/L (ref 22–32)
Calcium: 9.4 mg/dL (ref 8.9–10.3)
Creatinine, Ser: 0.74 mg/dL (ref 0.44–1.00)
GFR calc Af Amer: >60 mL/min (ref >60)
GFR calc non Af Amer: >60 mL/min (ref >60)
GLUCOSE: 62 mg/dL — AB (ref 65–99)
POTASSIUM: 3.9 mmol/L (ref 3.5–5.1)
Sodium: 140 mmol/L (ref 135–145)

## 2017-01-24 NOTE — Assessment & Plan Note (Signed)
Bilateral mastectomies 07/24/2016  Left mastectomy: ILC grade 2, 1.4 cm, margins negative, 0/2 lymph nodes negative, T1 CN 0 stage IA, ER 95%, PR 0%, HER-2 negative, Ki-67 20% Right mastectomy: Benign  Oncotype DX score 26:17% risk of distant recurrence  Recommendation: 1.Adjuvant chemotherapy with Taxotere and Cytoxan every 3 weeks 4 cycles started 08/25/2016-10/27/2016 2. followed by anastrozole 1 mg daily with ovarian suppression (labs on 08/04/2016 showed FSH21.7 and estradiol less than 2.5)  -------------------------------------------------------------------------- Current treatment: Anastrozole 1 mg daily  Anastrozole toxicities:  Return to clinic in 6 months for follow-up after that we can see her once a year

## 2017-01-26 ENCOUNTER — Other Ambulatory Visit (HOSPITAL_BASED_OUTPATIENT_CLINIC_OR_DEPARTMENT_OTHER): Payer: 59

## 2017-01-26 ENCOUNTER — Ambulatory Visit (HOSPITAL_BASED_OUTPATIENT_CLINIC_OR_DEPARTMENT_OTHER): Payer: 59 | Admitting: Hematology and Oncology

## 2017-01-26 ENCOUNTER — Other Ambulatory Visit: Payer: Self-pay

## 2017-01-26 DIAGNOSIS — C50411 Malignant neoplasm of upper-outer quadrant of right female breast: Secondary | ICD-10-CM

## 2017-01-26 DIAGNOSIS — Z79811 Long term (current) use of aromatase inhibitors: Secondary | ICD-10-CM | POA: Diagnosis not present

## 2017-01-26 DIAGNOSIS — Z170 Estrogen receptor positive status [ER+]: Secondary | ICD-10-CM | POA: Diagnosis not present

## 2017-01-26 DIAGNOSIS — C50212 Malignant neoplasm of upper-inner quadrant of left female breast: Secondary | ICD-10-CM

## 2017-01-26 LAB — COMPREHENSIVE METABOLIC PANEL
ALBUMIN: 3.9 g/dL (ref 3.5–5.0)
ALK PHOS: 104 U/L (ref 40–150)
ALT: 14 U/L (ref 0–55)
AST: 11 U/L (ref 5–34)
Anion Gap: 9 mEq/L (ref 3–11)
BUN: 16.7 mg/dL (ref 7.0–26.0)
CHLORIDE: 107 meq/L (ref 98–109)
CO2: 27 meq/L (ref 22–29)
Calcium: 9.5 mg/dL (ref 8.4–10.4)
Creatinine: 0.9 mg/dL (ref 0.6–1.1)
GLUCOSE: 128 mg/dL (ref 70–140)
POTASSIUM: 4.1 meq/L (ref 3.5–5.1)
SODIUM: 143 meq/L (ref 136–145)
Total Bilirubin: 0.22 mg/dL (ref 0.20–1.20)
Total Protein: 7.4 g/dL (ref 6.4–8.3)

## 2017-01-26 LAB — CBC WITH DIFFERENTIAL/PLATELET
BASO%: 0.4 % (ref 0.0–2.0)
Basophils Absolute: 0 10*3/uL (ref 0.0–0.1)
EOS ABS: 0.2 10*3/uL (ref 0.0–0.5)
EOS%: 4 % (ref 0.0–7.0)
HEMATOCRIT: 33.8 % — AB (ref 34.8–46.6)
HGB: 10.4 g/dL — ABNORMAL LOW (ref 11.6–15.9)
LYMPH#: 0.9 10*3/uL (ref 0.9–3.3)
LYMPH%: 19.2 % (ref 14.0–49.7)
MCH: 26.4 pg (ref 25.1–34.0)
MCHC: 30.8 g/dL — AB (ref 31.5–36.0)
MCV: 85.8 fL (ref 79.5–101.0)
MONO#: 0.5 10*3/uL (ref 0.1–0.9)
MONO%: 10.4 % (ref 0.0–14.0)
NEUT#: 3.1 10*3/uL (ref 1.5–6.5)
NEUT%: 66 % (ref 38.4–76.8)
PLATELETS: 268 10*3/uL (ref 145–400)
RBC: 3.94 10*6/uL (ref 3.70–5.45)
RDW: 16.2 % — ABNORMAL HIGH (ref 11.2–14.5)
WBC: 4.7 10*3/uL (ref 3.9–10.3)

## 2017-01-26 NOTE — Progress Notes (Signed)
Patient Care Team: ViaLennette Bihari, MD as PCP - General (Family Medicine) Jovita Kussmaul, MD as Consulting Physician (General Surgery) Nicholas Lose, MD as Consulting Physician (Hematology and Oncology) Gery Pray, MD as Consulting Physician (Radiation Oncology) Rockwell Germany, RN as Registered Nurse Mauro Kaufmann, RN as Registered Nurse Holley Bouche, NP as Nurse Practitioner (Nurse Practitioner) Sylvan Cheese, NP as Nurse Practitioner (Nurse Practitioner)  DIAGNOSIS:  Encounter Diagnosis  Name Primary?  . 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; ~ 3 cm AP diameter linear clumped NME extends from the medial biopsy cavity posteriorly in the central breast which is suspicious for disease extension      04/26/2014 Clinical Stage    Stage 0: Tis Nx      06/05/2014 Definitive Surgery    Right lumpectomy [name]): ILC, grade 2/3, 0.6 cm, LCIS invasive ca focally present at inferior margin (rt Medial); ILC, grade 2-3, 1.7 cm with neg margins with LCIS (Rt Lateral) ER 100%, PR 99%, Ki 67 6%, HER2/neu neg (ratio 1.14)      06/05/2014 Oncotype testing    Score: 8 (ROR 6%)      06/05/2014 Pathologic Stage    Stage IA: mpT1c pNx      06/23/2014 Surgery    Right breast excision: Benign, 0/3 lymph nodes negative      07/24/2014 - 09/08/2014 Radiation Therapy    Adjuvant RT completed [name]): Right breast 45 Gy over 25 fractions.  Right breast  boost 16 Gy over 8 fractions.  Total dose: 60 Gy.        09/25/2014 - 07/24/2016 Anti-estrogen oral therapy    Tamoxifen 20 mg daily (Leilanny Fluitt).      11/02/2014 Procedure    Breast/Ovarian panel revealed no clinically significant variant at ATM, BARD1, BRCA1, BRCA2, BRIP1, CDH1, CHEK2, FANCC, MLH1, MSH2, MSH6, NBN, PALB2, PMS2, PTEN, RAD51C, RAD51D, TP53, and XRCC2      11/03/2014 Survivorship    Survivorship visit completed and copy of survivorship care plan provided to patient.      05/07/2016 Breast MRI    Left breast non-mass enhancement spanning 6.9 cm. Small enhancing mass 5.6 cm, no lymph nodes. Right breast normal      05/20/2016 Pathology Results    Left breast biopsy posterior outer: Invasive lobular cancer with LCIS, ER 95%, PR 0%, Ki-67 20%, HER-2 negative ratio 1.25      07/24/2016 Surgery    Left mastectomy: ILC grade 2, 1.4 cm, margins negative, 0/2 lymph nodes negative, T1 CN 0 stage IA, ER 95%, PR 0%, HER-2 negative, Ki-67 20% Right mastectomy: Benign      08/11/2016 Oncotype testing    Oncotype score 26:17% risk of recurrence, high intermediate risk      08/25/2016 - 10/27/2016 Chemotherapy    Taxotere and Cytoxan 4 cycles       11/10/2016 -  Anti-estrogen oral therapy    Anastrozole 1 mg daily  CHIEF COMPLIANT: Follow-up on anastrozole therapy  INTERVAL HISTORY: [name] [name] is a 51 year old lady with recurrent left breast cancer who completed adjuvant chemotherapy in September and started antiestrogen therapy with anastrozole October 1.  She appears to be tolerating anastrozole fairly well.  She feels hot inside her body but it has not been too significant  REVIEW OF SYSTEMS:   Constitutional: Denies fevers, chills or abnormal weight loss Eyes: Denies blurriness of vision Ears, nose, mouth, throat, and face: Denies mucositis or sore throat Respiratory: Denies cough, dyspnea or wheezes Cardiovascular: Denies palpitation, chest discomfort Gastrointestinal:   Denies nausea, heartburn or change in bowel habits Skin: Denies abnormal skin rashes Lymphatics: Denies new lymphadenopathy or easy bruising Neurological:Denies numbness, tingling or new weaknesses Behavioral/Psych: Mood is stable, no new changes  Extremities: No lower extremity edema All other systems were reviewed with the patient and are negative.  I have reviewed the past medical history, past surgical history, social history and family history with the patient and they are unchanged from previous note.  ALLERGIES:  is allergic to penicillins; shellfish allergy; and tetracyclines & related.  MEDICATIONS:  Current Outpatient Medications  Medication Sig Dispense Refill  . anastrozole (ARIMIDEX) 1 MG tablet Take 1 mg by mouth daily.    Marland Kitchen atorvastatin (LIPITOR) 10 MG tablet Take 1 tablet (10 mg total) by mouth daily.    Marland Kitchen glimepiride (AMARYL) 4 MG tablet Take 1 tablet (4 mg total) by mouth daily with breakfast.    . metFORMIN (GLUCOPHAGE) 1000 MG tablet Take 1,000 mg by mouth 2 (two) times daily with a meal.     No current facility-administered medications for this visit.     PHYSICAL EXAMINATION: ECOG PERFORMANCE STATUS: 1 - Symptomatic but completely ambulatory  Vitals:   01/26/17 0941  BP: (!) 141/97  Pulse: (!) 104  Resp: 18  Temp: (!) 97.5 F (36.4 C)  SpO2: 97%   Filed Weights   01/26/17 0941  Weight: 249 lb 1.6 oz (113 kg)    GENERAL:alert, no distress and comfortable SKIN: skin color, texture, turgor are normal, no rashes or significant lesions EYES: normal, Conjunctiva are pink and non-injected, sclera clear OROPHARYNX:no exudate, no erythema and lips, buccal mucosa, and tongue normal  NECK: supple, thyroid normal size, non-tender, without nodularity LYMPH:  no palpable lymphadenopathy in the cervical, axillary or inguinal LUNGS: clear to auscultation and percussion with normal breathing effort HEART: regular rate & rhythm and no murmurs and no lower extremity  edema ABDOMEN:abdomen soft, non-tender and normal bowel sounds MUSCULOSKELETAL:no cyanosis of digits and no clubbing  NEURO: alert & oriented x 3 with fluent speech, no focal motor/sensory deficits EXTREMITIES: No lower extremity edema  LABORATORY DATA:  I have reviewed the data as listed   Chemistry      Component Value Date/Time   NA 143 01/26/2017 1030   K 4.1 01/26/2017 1030   CL 104 01/09/2017 1600   CO2 27 01/26/2017 1030   BUN 16.7 01/26/2017 1030   CREATININE 0.9 01/26/2017 1030      Component Value Date/Time   CALCIUM 9.5 01/26/2017 1030   ALKPHOS 104 01/26/2017 1030   AST 11 01/26/2017 1030   ALT 14 01/26/2017 1030   BILITOT 0.22 01/26/2017 1030       Lab Results  Component Value Date   WBC 4.7 01/26/2017   HGB 10.4 (L) 01/26/2017   HCT 33.8 (L) 01/26/2017   MCV 85.8 01/26/2017   PLT 268 01/26/2017   NEUTROABS 3.1 01/26/2017  ASSESSMENT & PLAN:  Breast cancer of upper-outer quadrant of right female breast Bilateral mastectomies 07/24/2016  Left mastectomy: ILC grade 2, 1.4 cm, margins negative, 0/2 lymph nodes negative, T1 CN 0 stage IA, ER 95%, PR 0%, HER-2 negative, Ki-67 20% Right mastectomy: Benign  Oncotype DX score 26:17% risk of distant recurrence  Recommendation: 1.Adjuvant chemotherapy with Taxotere and Cytoxan every 3 weeks 4 cycles started 08/25/2016-10/27/2016 2. followed by anastrozole 1 mg daily with ovarian suppression (labs on 08/04/2016 showed FSH21.7 and estradiol less than 2.5)  -------------------------------------------------------------------------- Current treatment: Anastrozole 1 mg daily I would like to repeat [place] and estradiol levels today.  If the levels suggest menopause but will continue with anastrozole.  If the levels are not in the menopausal range, then we may have to do ovarian suppression.  Anastrozole toxicities: Feeling of heat in the body but not hot flashes Denies any arthralgias or myalgias.  Patient is  very keen on starting a nonprofit organization to help women going to breast cancer.  I instructed her about sister's network and she will reach out to them. Return to clinic in 6 months for follow-up after that we can see her once a year   I spent 25 minutes talking to the patient of which more than half was spent in counseling and coordination of care.  Orders Placed This Encounter  Procedures  . Follicle stimulating hormone    Standing Status:   Future    Number of Occurrences:   1    Standing Expiration Date:   03/02/2018  . Estradiol, Ultra Sens   The patient has a good understanding of the overall plan. she agrees with it. she will call with any problems that may develop before the next visit here.   Rulon Eisenmenger, MD 01/26/17

## 2017-01-27 LAB — FOLLICLE STIMULATING HORMONE: FSH: 55.1 m[IU]/mL

## 2017-03-03 NOTE — Pre-Procedure Instructions (Signed)
[name] [name]  03/03/2017      [id number] - [address] [place] [address] Phone: [phone] Fax: [phone]    Your procedure is scheduled on January 30  Report to [place] at [place].M.  Call this number if you have problems the morning of surgery:  [phone]   Remember:  Do not eat food or drink liquids after midnight.  Continue all medications as directed by your physician except follow these medication instructions before surgery below   Take these medicines the morning of surgery with A SIP OF WATER  anastrozole (ARIMIDEX)   diazepam (VALIUM) HYDROcodone-acetaminophen (NORCO/VICODIN)   7 days prior to surgery STOP taking any Aspirin(unless otherwise instructed by your surgeon), Aleve, Naproxen, Ibuprofen, Motrin, Advil, Goody's, BC's, all herbal medications, fish oil, and all vitamins   WHAT DO I DO ABOUT MY DIABETES MEDICATION?   Marland Kitchen Do not take oral diabetes medicines (pills) the morning of surgery. glimepiride (AMARYL metFORMIN (GLUCOPHAGE)    How to Manage Your Diabetes Before and After Surgery  Why is it important to control my blood sugar before and after surgery? . Improving blood sugar levels before and after surgery helps healing and can limit problems. . A way of improving blood sugar control is eating a healthy diet by: o  Eating less sugar and carbohydrates o  Increasing activity/exercise o  Talking with your doctor about reaching your blood sugar goals . High blood sugars (greater than 180 mg/dL) can raise your risk of infections and slow your recovery, so you will need to focus on controlling your diabetes during the weeks before surgery. . Make sure that the doctor who takes care of your diabetes knows about your planned surgery including the date and location.  How do I manage my blood sugar before surgery? . Check your blood sugar at least 4 times a day, starting 2 days before  surgery, to make sure that the level is not too high or low. o Check your blood sugar the morning of your surgery when you wake up and every 2 hours until you get to the Short Stay unit. . If your blood sugar is less than 70 mg/dL, you will need to treat for low blood sugar: o Do not take insulin. o Treat a low blood sugar (less than 70 mg/dL) with  cup of clear juice (cranberry or apple), 4 glucose tablets, OR glucose gel. o Recheck blood sugar in 15 minutes after treatment (to make sure it is greater than 70 mg/dL). If your blood sugar is not greater than 70 mg/dL on recheck, call [phone] for further instructions. . Report your blood sugar to the short stay nurse when you get to Short Stay.  . If you are admitted to the hospital after surgery: o Your blood sugar will be checked by the staff and you will probably be given insulin after surgery (instead of oral diabetes medicines) to make sure you have good blood sugar levels. o The goal for blood sugar control after surgery is 80-180 mg/dL.     Do not wear jewelry, make-up or nail polish.  Do not wear lotions, powders, or perfumes, or deodorant.  Do not shave 48 hours prior to surgery.    Do not bring valuables to the hospital.  [facility] is not responsible for any belongings or valuables.  Contacts, dentures or bridgework may not be worn into surgery.  Leave your suitcase in the  car.  After surgery it may be brought to your room.  For patients admitted to the hospital, discharge time will be determined by your treatment team.  Patients discharged the day of surgery will not be allowed to drive home.    Special instructions:   [facility]- Preparing For Surgery  Before surgery, you can play an important role. Because skin is not sterile, your skin needs to be as free of germs as possible. You can reduce the number of germs on your skin by washing with CHG (chlorahexidine gluconate) Soap before surgery.  CHG is an antiseptic  cleaner which kills germs and bonds with the skin to continue killing germs even after washing.  Please do not use if you have an allergy to CHG or antibacterial soaps. If your skin becomes reddened/irritated stop using the CHG.  Do not shave (including legs and underarms) for at least 48 hours prior to first CHG shower. It is OK to shave your face.  Please follow these instructions carefully.   1. Shower the NIGHT BEFORE SURGERY and the MORNING OF SURGERY with CHG.   2. If you chose to wash your hair, wash your hair first as usual with your normal shampoo.  3. After you shampoo, rinse your hair and body thoroughly to remove the shampoo.  4. Use CHG as you would any other liquid soap. You can apply CHG directly to the skin and wash gently with a scrungie or a clean washcloth.   5. Apply the CHG Soap to your body ONLY FROM THE NECK DOWN.  Do not use on open wounds or open sores. Avoid contact with your eyes, ears, mouth and genitals (private parts). Wash Face and genitals (private parts)  with your normal soap.  6. Wash thoroughly, paying special attention to the area where your surgery will be performed.  7. Thoroughly rinse your body with warm water from the neck down.  8. DO NOT shower/wash with your normal soap after using and rinsing off the CHG Soap.  9. Pat yourself dry with a CLEAN TOWEL.  10. Wear CLEAN PAJAMAS to bed the night before surgery, wear comfortable clothes the morning of surgery  11. Place CLEAN SHEETS on your bed the night of your first shower and DO NOT SLEEP WITH PETS.    Day of Surgery: Do not apply any deodorants/lotions. Please wear clean clothes to the hospital/surgery center.      Please read over the following fact sheets that you were given.

## 2017-03-04 ENCOUNTER — Encounter (HOSPITAL_COMMUNITY)
Admission: RE | Admit: 2017-03-04 | Discharge: 2017-03-04 | Disposition: A | Discharge status: Home / Self Care | Payer: 59 | Source: Ambulatory Visit | Attending: Plastic Surgery | Admitting: Plastic Surgery

## 2017-03-04 ENCOUNTER — Other Ambulatory Visit: Payer: Self-pay

## 2017-03-04 ENCOUNTER — Encounter (HOSPITAL_COMMUNITY): Payer: Self-pay

## 2017-03-04 DIAGNOSIS — Z01812 Encounter for preprocedural laboratory examination: Secondary | ICD-10-CM | POA: Diagnosis present

## 2017-03-04 LAB — HEMOGLOBIN A1C
HEMOGLOBIN A1C: 7.4 % — AB (ref 4.8–5.6)
Mean Plasma Glucose: 165.68 mg/dL

## 2017-03-04 LAB — BASIC METABOLIC PANEL
Anion gap: 13 (ref 5–15)
BUN: 12 mg/dL (ref 6–20)
CHLORIDE: 103 mmol/L (ref 101–111)
CO2: 25 mmol/L (ref 22–32)
CREATININE: 0.84 mg/dL (ref 0.44–1.00)
Calcium: 9.4 mg/dL (ref 8.9–10.3)
GFR calc non Af Amer: >60 mL/min (ref >60)
GLUCOSE: 92 mg/dL (ref 65–99)
Potassium: 3.9 mmol/L (ref 3.5–5.1)
Sodium: 141 mmol/L (ref 135–145)

## 2017-03-04 LAB — GLUCOSE, CAPILLARY: Glucose-Capillary: 95 mg/dL (ref 65–99)

## 2017-03-04 LAB — HEMOGLOBIN: Hemoglobin: 11 g/dL — ABNORMAL LOW (ref 12.0–15.0)

## 2017-03-04 NOTE — Progress Notes (Signed)
Spoke with Dr. Elenor Quinones office about consent for.  It will be changed with the posting and consent. Patient's surgery will also be moved to Feb 5th per office

## 2017-03-04 NOTE — Progress Notes (Signed)
PCP - Lennette Bihari Via Cardiologist - denies  Chest x-ray - not needed EKG - 07/18/16 Stress Test -denies  ECHO - denies Cardiac Cath - denies    Fasting Blood Sugar - 120-125 Checks Blood Sugar ___2__ times a day   Anesthesia review: no  Patient denies shortness of breath, fever, cough and chest pain at PAT appointment   Patient verbalized understanding of instructions that were given to them at the PAT appointment. Patient was also instructed that they will need to review over the PAT instructions again at home before surgery.

## 2017-03-12 ENCOUNTER — Ambulatory Visit: Payer: Self-pay | Admitting: Plastic Surgery

## 2017-03-17 ENCOUNTER — Encounter (HOSPITAL_COMMUNITY)
Admission: AD | Disposition: A | Discharge status: Home / Self Care | Payer: Self-pay | Source: Ambulatory Visit | Attending: Plastic Surgery

## 2017-03-17 ENCOUNTER — Encounter (HOSPITAL_COMMUNITY): Payer: Self-pay

## 2017-03-17 ENCOUNTER — Ambulatory Visit (HOSPITAL_COMMUNITY): Payer: 59 | Admitting: Emergency Medicine

## 2017-03-17 ENCOUNTER — Observation Stay (HOSPITAL_COMMUNITY)
Admission: AD | Admit: 2017-03-17 | Discharge: 2017-03-19 | DRG: 941 | Disposition: A | Discharge status: Home / Self Care | Payer: 59 | Source: Ambulatory Visit | Attending: Plastic Surgery | Admitting: Plastic Surgery

## 2017-03-17 DIAGNOSIS — Z6839 Body mass index (BMI) 39.0-39.9, adult: Secondary | ICD-10-CM | POA: Insufficient documentation

## 2017-03-17 DIAGNOSIS — Z803 Family history of malignant neoplasm of breast: Secondary | ICD-10-CM

## 2017-03-17 DIAGNOSIS — Z79811 Long term (current) use of aromatase inhibitors: Secondary | ICD-10-CM | POA: Diagnosis not present

## 2017-03-17 DIAGNOSIS — Z9221 Personal history of antineoplastic chemotherapy: Secondary | ICD-10-CM | POA: Diagnosis not present

## 2017-03-17 DIAGNOSIS — Z9013 Acquired absence of bilateral breasts and nipples: Secondary | ICD-10-CM

## 2017-03-17 DIAGNOSIS — Z880 Allergy status to penicillin: Secondary | ICD-10-CM

## 2017-03-17 DIAGNOSIS — Z881 Allergy status to other antibiotic agents status: Secondary | ICD-10-CM | POA: Diagnosis not present

## 2017-03-17 DIAGNOSIS — Z91013 Allergy to seafood: Secondary | ICD-10-CM

## 2017-03-17 DIAGNOSIS — Z421 Encounter for breast reconstruction following mastectomy: Principal | ICD-10-CM | POA: Insufficient documentation

## 2017-03-17 DIAGNOSIS — T8579XA Infection and inflammatory reaction due to other internal prosthetic devices, implants and grafts, initial encounter: Secondary | ICD-10-CM | POA: Diagnosis not present

## 2017-03-17 DIAGNOSIS — E119 Type 2 diabetes mellitus without complications: Secondary | ICD-10-CM | POA: Diagnosis not present

## 2017-03-17 DIAGNOSIS — Z853 Personal history of malignant neoplasm of breast: Secondary | ICD-10-CM

## 2017-03-17 DIAGNOSIS — Z7984 Long term (current) use of oral hypoglycemic drugs: Secondary | ICD-10-CM | POA: Diagnosis not present

## 2017-03-17 DIAGNOSIS — Z79899 Other long term (current) drug therapy: Secondary | ICD-10-CM | POA: Diagnosis not present

## 2017-03-17 DIAGNOSIS — F419 Anxiety disorder, unspecified: Secondary | ICD-10-CM | POA: Diagnosis not present

## 2017-03-17 DIAGNOSIS — Z923 Personal history of irradiation: Secondary | ICD-10-CM | POA: Diagnosis not present

## 2017-03-17 HISTORY — PX: BREAST SURGERY: SHX581

## 2017-03-17 HISTORY — PX: LATISSIMUS FLAP TO BREAST: SHX5357

## 2017-03-17 LAB — CBC
HEMATOCRIT: 32.1 % — AB (ref 36.0–46.0)
HEMOGLOBIN: 9.8 g/dL — AB (ref 12.0–15.0)
MCH: 25.5 pg — AB (ref 26.0–34.0)
MCHC: 30.5 g/dL (ref 30.0–36.0)
MCV: 83.6 fL (ref 78.0–100.0)
Platelets: 270 10*3/uL (ref 150–400)
RBC: 3.84 MIL/uL — ABNORMAL LOW (ref 3.87–5.11)
RDW: 17.1 % — ABNORMAL HIGH (ref 11.5–15.5)
WBC: 4.6 10*3/uL (ref 4.0–10.5)

## 2017-03-17 LAB — GLUCOSE, CAPILLARY
Glucose-Capillary: 132 mg/dL — ABNORMAL HIGH (ref 65–99)
Glucose-Capillary: 150 mg/dL — ABNORMAL HIGH (ref 65–99)

## 2017-03-17 SURGERY — RECONSTRUCTION, BREAST, USING LATISSIMUS DORSI MYOCUTANEOUS FLAP
Anesthesia: General | Site: Chest | Laterality: Right

## 2017-03-17 MED ORDER — ACETAMINOPHEN 500 MG PO TABS
500.0000 mg | ORAL_TABLET | Freq: Four times a day (QID) | ORAL | Status: DC
  Administered 2017-03-17 – 2017-03-18 (×5): 500 mg via ORAL
  Filled 2017-03-17 (×5): qty 1

## 2017-03-17 MED ORDER — KCL IN DEXTROSE-NACL 20-5-0.45 MEQ/L-%-% IV SOLN
INTRAVENOUS | Status: DC
  Administered 2017-03-17 – 2017-03-18 (×3): via INTRAVENOUS
  Filled 2017-03-17 (×4): qty 1000

## 2017-03-17 MED ORDER — GLIMEPIRIDE 4 MG PO TABS
4.0000 mg | ORAL_TABLET | Freq: Every day | ORAL | Status: DC
  Administered 2017-03-18 – 2017-03-19 (×2): 4 mg via ORAL
  Filled 2017-03-17 (×2): qty 1

## 2017-03-17 MED ORDER — ROCURONIUM BROMIDE 10 MG/ML (PF) SYRINGE
PREFILLED_SYRINGE | INTRAVENOUS | Status: AC
  Filled 2017-03-17: qty 5

## 2017-03-17 MED ORDER — ROCURONIUM BROMIDE 100 MG/10ML IV SOLN
INTRAVENOUS | Status: DC | PRN
  Administered 2017-03-17: 50 mg via INTRAVENOUS
  Administered 2017-03-17: 10 mg via INTRAVENOUS

## 2017-03-17 MED ORDER — EVICEL 5 ML EX KIT
PACK | CUTANEOUS | Status: DC | PRN
  Administered 2017-03-17: 1

## 2017-03-17 MED ORDER — SUGAMMADEX SODIUM 500 MG/5ML IV SOLN
INTRAVENOUS | Status: DC | PRN
  Administered 2017-03-17: 225 mg via INTRAVENOUS

## 2017-03-17 MED ORDER — ONDANSETRON HCL 4 MG/2ML IJ SOLN
INTRAMUSCULAR | Status: AC
  Filled 2017-03-17: qty 2

## 2017-03-17 MED ORDER — POLYMYXIN B SULFATE 500000 UNITS IJ SOLR
INTRAMUSCULAR | Status: DC | PRN
  Administered 2017-03-17: 500 mL

## 2017-03-17 MED ORDER — SODIUM CHLORIDE 0.9 % IR SOLN
Status: DC | PRN
  Administered 2017-03-17: 1000 mL

## 2017-03-17 MED ORDER — METFORMIN HCL 500 MG PO TABS
1000.0000 mg | ORAL_TABLET | Freq: Two times a day (BID) | ORAL | Status: DC
  Administered 2017-03-17 – 2017-03-19 (×5): 1000 mg via ORAL
  Filled 2017-03-17 (×6): qty 2

## 2017-03-17 MED ORDER — MEPERIDINE HCL 25 MG/ML IJ SOLN: 6.2500 mg | INTRAMUSCULAR | Status: DC | PRN

## 2017-03-17 MED ORDER — 0.9 % SODIUM CHLORIDE (POUR BTL) OPTIME
TOPICAL | Status: DC | PRN
  Administered 2017-03-17 (×2): 1000 mL

## 2017-03-17 MED ORDER — MIDAZOLAM HCL 2 MG/2ML IJ SOLN
INTRAMUSCULAR | Status: AC
  Filled 2017-03-17: qty 2

## 2017-03-17 MED ORDER — WHITE PETROLATUM EX OINT
TOPICAL_OINTMENT | CUTANEOUS | Status: AC
  Administered 2017-03-17: 13:00:00
  Filled 2017-03-17: qty 28.35

## 2017-03-17 MED ORDER — DEXAMETHASONE SODIUM PHOSPHATE 10 MG/ML IJ SOLN
INTRAMUSCULAR | Status: DC | PRN
  Administered 2017-03-17: 10 mg via INTRAVENOUS

## 2017-03-17 MED ORDER — OXYCODONE HCL 5 MG/5ML PO SOLN: 5.0000 mg | Freq: Once | ORAL | Status: DC | PRN

## 2017-03-17 MED ORDER — LACTATED RINGERS IV SOLN
INTRAVENOUS | Status: DC | PRN
  Administered 2017-03-17 (×2): via INTRAVENOUS

## 2017-03-17 MED ORDER — PROPOFOL 10 MG/ML IV BOLUS
INTRAVENOUS | Status: AC
  Filled 2017-03-17: qty 20

## 2017-03-17 MED ORDER — HYDROMORPHONE HCL 1 MG/ML IJ SOLN
1.0000 mg | INTRAMUSCULAR | Status: DC | PRN
  Administered 2017-03-17 (×2): 1 mg via INTRAVENOUS
  Filled 2017-03-17 (×2): qty 1

## 2017-03-17 MED ORDER — ONDANSETRON HCL 4 MG PO TABS
4.0000 mg | ORAL_TABLET | Freq: Three times a day (TID) | ORAL | Status: DC | PRN
  Administered 2017-03-17 – 2017-03-18 (×2): 4 mg via ORAL
  Filled 2017-03-17 (×3): qty 1

## 2017-03-17 MED ORDER — LIDOCAINE HCL 1 % IJ SOLN
Freq: Once | INTRAMUSCULAR | Status: DC
Start: 2017-03-17 — End: 2017-03-17
  Filled 2017-03-17: qty 50

## 2017-03-17 MED ORDER — DIPHENHYDRAMINE HCL 12.5 MG/5ML PO ELIX: 12.5000 mg | ORAL_SOLUTION | Freq: Four times a day (QID) | ORAL | Status: DC | PRN

## 2017-03-17 MED ORDER — DIAZEPAM 2 MG PO TABS
2.0000 mg | ORAL_TABLET | Freq: Three times a day (TID) | ORAL | Status: DC | PRN
  Administered 2017-03-18: 2 mg via ORAL
  Filled 2017-03-17: qty 1

## 2017-03-17 MED ORDER — HYDROMORPHONE HCL 1 MG/ML IJ SOLN
0.2500 mg | INTRAMUSCULAR | Status: DC | PRN
  Administered 2017-03-17 (×2): 0.5 mg via INTRAVENOUS

## 2017-03-17 MED ORDER — HYDROCODONE-ACETAMINOPHEN 5-325 MG PO TABS
1.0000 | ORAL_TABLET | ORAL | Status: DC | PRN
  Administered 2017-03-17 – 2017-03-19 (×7): 2 via ORAL
  Filled 2017-03-17 (×7): qty 2

## 2017-03-17 MED ORDER — CIPROFLOXACIN IN D5W 400 MG/200ML IV SOLN
400.0000 mg | INTRAVENOUS | Status: AC
  Administered 2017-03-17: 400 mg via INTRAVENOUS

## 2017-03-17 MED ORDER — ATORVASTATIN CALCIUM 10 MG PO TABS
10.0000 mg | ORAL_TABLET | Freq: Every day | ORAL | Status: DC
  Administered 2017-03-17 – 2017-03-19 (×3): 10 mg via ORAL
  Filled 2017-03-17 (×4): qty 1

## 2017-03-17 MED ORDER — FENTANYL CITRATE (PF) 250 MCG/5ML IJ SOLN
INTRAMUSCULAR | Status: AC
  Filled 2017-03-17: qty 5

## 2017-03-17 MED ORDER — OXYCODONE HCL 5 MG PO TABS: 5.0000 mg | ORAL_TABLET | Freq: Once | ORAL | Status: DC | PRN

## 2017-03-17 MED ORDER — PROMETHAZINE HCL 25 MG/ML IJ SOLN: 6.2500 mg | INTRAMUSCULAR | Status: DC | PRN

## 2017-03-17 MED ORDER — EVICEL 5 ML EX KIT
PACK | CUTANEOUS | Status: AC
  Filled 2017-03-17: qty 1

## 2017-03-17 MED ORDER — HYDROMORPHONE HCL 1 MG/ML IJ SOLN
INTRAMUSCULAR | Status: AC
  Administered 2017-03-17: 0.5 mg via INTRAVENOUS
  Filled 2017-03-17: qty 1

## 2017-03-17 MED ORDER — BUPIVACAINE-EPINEPHRINE (PF) 0.5% -1:200000 IJ SOLN
INTRAMUSCULAR | Status: AC
  Filled 2017-03-17: qty 30

## 2017-03-17 MED ORDER — POLYETHYLENE GLYCOL 3350 17 G PO PACK: 17.0000 g | PACK | Freq: Every day | ORAL | Status: DC | PRN

## 2017-03-17 MED ORDER — LIDOCAINE HCL (CARDIAC) 20 MG/ML IV SOLN
INTRAVENOUS | Status: DC | PRN
  Administered 2017-03-17: 60 mg via INTRAVENOUS

## 2017-03-17 MED ORDER — SENNA 8.6 MG PO TABS
1.0000 | ORAL_TABLET | Freq: Two times a day (BID) | ORAL | Status: DC
  Administered 2017-03-18 – 2017-03-19 (×2): 8.6 mg via ORAL
  Filled 2017-03-17 (×4): qty 1

## 2017-03-17 MED ORDER — MIDAZOLAM HCL 5 MG/5ML IJ SOLN
INTRAMUSCULAR | Status: DC | PRN
  Administered 2017-03-17: 2 mg via INTRAVENOUS

## 2017-03-17 MED ORDER — BUPIVACAINE-EPINEPHRINE (PF) 0.5% -1:200000 IJ SOLN
INTRAMUSCULAR | Status: DC | PRN
  Administered 2017-03-17: 4 mL
  Administered 2017-03-17: 10 mL

## 2017-03-17 MED ORDER — LABETALOL HCL 5 MG/ML IV SOLN
INTRAVENOUS | Status: DC | PRN
  Administered 2017-03-17: 5 mg via INTRAVENOUS

## 2017-03-17 MED ORDER — PROPOFOL 10 MG/ML IV BOLUS
INTRAVENOUS | Status: DC | PRN
  Administered 2017-03-17: 20 mg via INTRAVENOUS
  Administered 2017-03-17: 140 mg via INTRAVENOUS

## 2017-03-17 MED ORDER — DEXAMETHASONE SODIUM PHOSPHATE 10 MG/ML IJ SOLN
INTRAMUSCULAR | Status: AC
  Filled 2017-03-17: qty 1

## 2017-03-17 MED ORDER — FENTANYL CITRATE (PF) 100 MCG/2ML IJ SOLN
INTRAMUSCULAR | Status: DC | PRN
  Administered 2017-03-17 (×7): 50 ug via INTRAVENOUS

## 2017-03-17 MED ORDER — LIDOCAINE 2% (20 MG/ML) 5 ML SYRINGE
INTRAMUSCULAR | Status: AC
  Filled 2017-03-17: qty 5

## 2017-03-17 MED ORDER — CIPROFLOXACIN IN D5W 400 MG/200ML IV SOLN
400.0000 mg | Freq: Two times a day (BID) | INTRAVENOUS | Status: DC
  Administered 2017-03-17 – 2017-03-19 (×4): 400 mg via INTRAVENOUS
  Filled 2017-03-17 (×6): qty 200

## 2017-03-17 MED ORDER — CIPROFLOXACIN IN D5W 400 MG/200ML IV SOLN
INTRAVENOUS | Status: AC
  Filled 2017-03-17: qty 200

## 2017-03-17 MED ORDER — ONDANSETRON HCL 4 MG/2ML IJ SOLN
INTRAMUSCULAR | Status: DC | PRN
  Administered 2017-03-17: 4 mg via INTRAVENOUS

## 2017-03-17 MED ORDER — DIPHENHYDRAMINE HCL 50 MG/ML IJ SOLN: 12.5000 mg | Freq: Four times a day (QID) | INTRAMUSCULAR | Status: DC | PRN

## 2017-03-17 SURGICAL SUPPLY — 84 items
ADH SKN CLS APL DERMABOND .7 (GAUZE/BANDAGES/DRESSINGS) ×6
APPLIER CLIP 9.375 MED OPEN (MISCELLANEOUS) ×4
APR CLP MED 9.3 20 MLT OPN (MISCELLANEOUS) ×3
ATCH SMKEVC FLXB CAUT HNDSWH (FILTER) ×3 IMPLANT
BAG DECANTER FOR FLEXI CONT (MISCELLANEOUS) ×4 IMPLANT
BINDER BREAST 3XL (GAUZE/BANDAGES/DRESSINGS) ×2 IMPLANT
BIOPATCH RED 1 DISK 7.0 (GAUZE/BANDAGES/DRESSINGS) ×8 IMPLANT
BLADE SURG 10 STRL SS (BLADE) ×4 IMPLANT
BLADE SURG 15 STRL LF DISP TIS (BLADE) ×3 IMPLANT
BLADE SURG 15 STRL SS (BLADE) ×4
BNDG COHESIVE 4X5 TAN STRL (GAUZE/BANDAGES/DRESSINGS) IMPLANT
CANISTER SUCT 3000ML PPV (MISCELLANEOUS) ×4 IMPLANT
CHLORAPREP W/TINT 26ML (MISCELLANEOUS) ×4 IMPLANT
CLIP APPLIE 9.375 MED OPEN (MISCELLANEOUS) ×3 IMPLANT
CONNECTOR 5 IN 1 STRAIGHT STRL (MISCELLANEOUS) ×2 IMPLANT
COVER SURGICAL LIGHT HANDLE (MISCELLANEOUS) ×4 IMPLANT
DECANTER SPIKE VIAL GLASS SM (MISCELLANEOUS) ×4 IMPLANT
DERMABOND ADVANCED (GAUZE/BANDAGES/DRESSINGS) ×2
DERMABOND ADVANCED .7 DNX12 (GAUZE/BANDAGES/DRESSINGS) ×4 IMPLANT
DRAIN CHANNEL 19F RND (DRAIN) ×10 IMPLANT
DRAPE HALF SHEET 40X57 (DRAPES) ×8 IMPLANT
DRAPE INCISE 23X17 IOBAN STRL (DRAPES)
DRAPE INCISE 23X17 STRL (DRAPES) IMPLANT
DRAPE INCISE IOBAN 23X17 STRL (DRAPES) IMPLANT
DRAPE INCISE IOBAN 85X60 (DRAPES) IMPLANT
DRAPE ORTHO SPLIT 77X108 STRL (DRAPES) ×8
DRAPE SURG ORHT 6 SPLT 77X108 (DRAPES) ×6 IMPLANT
DRAPE WARM FLUID 44X44 (DRAPE) ×4 IMPLANT
DRSG MEPILEX BORDER 4X8 (GAUZE/BANDAGES/DRESSINGS) ×4 IMPLANT
DRSG PAD ABDOMINAL 8X10 ST (GAUZE/BANDAGES/DRESSINGS) ×8 IMPLANT
ELECT BLADE 4.0 EZ CLEAN MEGAD (MISCELLANEOUS) ×8
ELECT BLADE 6.5 EXT (BLADE) ×2 IMPLANT
ELECT CAUTERY BLADE 6.4 (BLADE) ×12 IMPLANT
ELECT REM PT RETURN 9FT ADLT (ELECTROSURGICAL) ×4
ELECTRODE BLDE 4.0 EZ CLN MEGD (MISCELLANEOUS) ×2 IMPLANT
ELECTRODE REM PT RTRN 9FT ADLT (ELECTROSURGICAL) ×3 IMPLANT
EVACUATOR SILICONE 100CC (DRAIN) ×10 IMPLANT
EVACUATOR SMOKE ACCUVAC VALLEY (FILTER) ×1
GAUZE SPONGE 4X4 12PLY STRL (GAUZE/BANDAGES/DRESSINGS) ×8 IMPLANT
GAUZE SPONGE 4X4 12PLY STRL LF (GAUZE/BANDAGES/DRESSINGS) ×2 IMPLANT
GAUZE XEROFORM 5X9 LF (GAUZE/BANDAGES/DRESSINGS) ×2 IMPLANT
GLOVE BIO SURGEON STRL SZ 6.5 (GLOVE) ×8 IMPLANT
GOWN STRL REUS W/ TWL LRG LVL3 (GOWN DISPOSABLE) ×12 IMPLANT
GOWN STRL REUS W/TWL LRG LVL3 (GOWN DISPOSABLE) ×16
IMPL EXPANDER BREAST 535CC (Breast) ×1 IMPLANT
IMPLANT BREAST 535CC (Breast) ×1 IMPLANT
IMPLANT EXPANDER BREAST 535CC (Breast) ×3 IMPLANT
KIT BASIN OR (CUSTOM PROCEDURE TRAY) ×4 IMPLANT
KIT ROOM TURNOVER OR (KITS) ×4 IMPLANT
MARKER SKIN DUAL TIP RULER LAB (MISCELLANEOUS) ×4 IMPLANT
NDL SPNL 18GX3.5 QUINCKE PK (NEEDLE) ×4 IMPLANT
NEEDLE 22X1 1/2 (OR ONLY) (NEEDLE) ×4 IMPLANT
NEEDLE SPNL 18GX3.5 QUINCKE PK (NEEDLE) ×8 IMPLANT
NS IRRIG 1000ML POUR BTL (IV SOLUTION) ×8 IMPLANT
PACK GENERAL/GYN (CUSTOM PROCEDURE TRAY) ×4 IMPLANT
PAD ABD 8X10 STRL (GAUZE/BANDAGES/DRESSINGS) ×2 IMPLANT
PAD ARMBOARD 7.5X6 YLW CONV (MISCELLANEOUS) ×12 IMPLANT
PENCIL BUTTON HOLSTER BLD 10FT (ELECTRODE) ×4 IMPLANT
SET ASEPTIC TRANSFER (MISCELLANEOUS) ×2 IMPLANT
SPONGE LAP 18X18 X RAY DECT (DISPOSABLE) IMPLANT
STAPLER VISISTAT 35W (STAPLE) ×4 IMPLANT
STOCKINETTE IMPERVIOUS 9X36 MD (GAUZE/BANDAGES/DRESSINGS) IMPLANT
STOPCOCK 4 WAY LG BORE MALE ST (IV SETS) IMPLANT
STRIP CLOSURE SKIN 1/2X4 (GAUZE/BANDAGES/DRESSINGS) IMPLANT
SUT ETHILON 2 0 FS 18 (SUTURE) ×8 IMPLANT
SUT MNCRL AB 3-0 PS2 18 (SUTURE) ×24 IMPLANT
SUT MNCRL AB 4-0 PS2 18 (SUTURE) ×12 IMPLANT
SUT MON AB 5-0 PS2 18 (SUTURE) ×12 IMPLANT
SUT PDS AB 3-0 SH 27 (SUTURE) ×2 IMPLANT
SUT SILK 3 0 SH 30 (SUTURE) ×6 IMPLANT
SUT VIC AB 3-0 PS2 18 (SUTURE) ×8
SUT VIC AB 3-0 PS2 18XBRD (SUTURE) ×10 IMPLANT
SUT VIC AB 3-0 SH 8-18 (SUTURE) ×4 IMPLANT
SYR 50ML LL SCALE MARK (SYRINGE) ×8 IMPLANT
SYR BULB IRRIGATION 50ML (SYRINGE) ×4 IMPLANT
SYR CONTROL 10ML LL (SYRINGE) ×4 IMPLANT
TOWEL GREEN STERILE (TOWEL DISPOSABLE) ×4 IMPLANT
TOWEL GREEN STERILE FF (TOWEL DISPOSABLE) ×4 IMPLANT
TOWEL OR 17X24 6PK STRL BLUE (TOWEL DISPOSABLE) ×4 IMPLANT
TOWEL OR 17X26 10 PK STRL BLUE (TOWEL DISPOSABLE) ×4 IMPLANT
TRAY CATH 16FR W/PLASTIC CATH (SET/KITS/TRAYS/PACK) ×2 IMPLANT
TRAY FOLEY CATH SILVER 16FR (SET/KITS/TRAYS/PACK) IMPLANT
TUBE CONNECTING 12X1/4 (SUCTIONS) ×4 IMPLANT
YANKAUER SUCT BULB TIP NO VENT (SUCTIONS) ×4 IMPLANT

## 2017-03-17 NOTE — Anesthesia Postprocedure Evaluation (Signed)
Anesthesia Post Note  Patient: [name] [name]  Procedure(s) Performed: LATISSIMUS FLAP TO RIGHT CHEST [place] (Right Chest) BREAST RECONSTRUCTION WITH PLACEMENT OF TISSUE EXPANDER (Right Breast)     Patient location during evaluation: PACU Anesthesia Type: General Level of consciousness: awake and alert Pain management: pain level controlled Vital Signs Assessment: post-procedure vital signs reviewed and stable Respiratory status: spontaneous breathing, nonlabored ventilation and respiratory function stable Cardiovascular status: blood pressure returned to baseline and stable Postop Assessment: no apparent nausea or vomiting Anesthetic complications: no    Last Vitals:  Vitals:   03/17/17 1145 03/17/17 1146  BP:  (!) 179/77  Pulse:  79  Resp:  17  Temp: 36.7 C   SpO2:  92%    Last Pain:  Vitals:   03/17/17 1140  TempSrc:   PainSc: [place]

## 2017-03-17 NOTE — Anesthesia Procedure Notes (Signed)
Procedure Name: Intubation Date/Time: 03/17/2017 7:44 AM Performed by: Jenne Campus, CRNA Pre-anesthesia Checklist: Patient identified, Emergency Drugs available, Suction available and Patient being monitored Patient Re-evaluated:Patient Re-evaluated prior to induction Oxygen Delivery Method: Circle System Utilized Preoxygenation: Pre-oxygenation with 100% oxygen Induction Type: IV induction Ventilation: Mask ventilation without difficulty and Oral airway inserted - appropriate to patient size Laryngoscope Size: Sabra Heck and 2 Grade View: Grade I Tube type: Oral Tube size: 7.5 mm Number of attempts: 1 Airway Equipment and Method: Stylet and Oral airway Placement Confirmation: ETT inserted through vocal cords under direct vision,  positive ETCO2 and breath sounds checked- equal and bilateral Secured at: 21 cm Tube secured with: Tape Dental Injury: Teeth and Oropharynx as per pre-operative assessment

## 2017-03-17 NOTE — H&P (Signed)
[name] [name] is an 52 y.o. female.   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 yrs old bf here for revision of her reconstruction with right breast latissimus dorsi flap and tissue expander exchange.   She underwent bilateral mastectomies with right sided radiation.  This has limited the ability to expand the right side.  The patient has expressed some concern about not being satisfied with the size and wanted to go larger and has elected to undergo a right latissimus flap.  She will also need work on the medial aspect of both breasts. Expanders currently are 420/300 cc. She completed chemotherapy (Taxotere and Cytoxan) in Sept. 2018. She is on antiestrogen therapy now, anastrozole.   History:  RIGHT upper-outer quadrant breast cancer, ER/PR positive, Her 2 negative. She underwent right breast lumpectomy with radiation (08/2014) and tamoxifen therapy.Mammogram was negative but MRI showed a 6.9 cm area of enhancement.  Biopsy = invasive lobular cancer and on 05/20/16 the LEFT breast invasive lobular cancer with LCIS, Ki-67 20%, HER-2 negative.  The Tamoxifen was stopped 4/26 and switched to anastrozole daily.  She is 5 feet 7 inches tall, weight is 244 pounds.  Preop bra= 38C.  The sternal notch to NAD on the right is 28 cm and 30 cm on the left.    Past Medical History:  Diagnosis Date  . Breast cancer ([place]) 2016   right breast cancer, 2018- Left  . Diabetes mellitus without complication ([place])    TYpe II  . History of radiation therapy 2016  . Pneumonia 04/2016    Past Surgical History:  Procedure Laterality Date  . AXILLARY SENTINEL NODE BIOPSY Right 06/23/2014   Procedure: AXILLARY SENTINEL NODE BIOPSY;  Surgeon: Autumn Messing III, MD;  Location: [place];  Service: General;  Laterality: Right;  . BREAST LUMPECTOMY WITH NEEDLE LOCALIZATION Right 06/05/2014   Procedure: RIGHT BREAST LUMPECTOMY WITH NEEDLE LOCALIZATION;  Surgeon: Autumn Messing III, MD;   Location: [place];  Service: General;  Laterality: Right;  . BREAST RECONSTRUCTION WITH PLACEMENT OF TISSUE EXPANDER AND FLEX HD (ACELLULAR HYDRATED DERMIS) Bilateral 07/24/2016   Procedure: IMMEDIATE BREAST RECONSTRUCTION WITH PLACEMENT OF TISSUE EXPANDER AND FLEX HD (ACELLULAR HYDRATED DERMIS);  Surgeon: Wallace Going, DO;  Location: [facility];  Service: Plastics;  Laterality: Bilateral;  . MASTECTOMY W/ SENTINEL NODE BIOPSY Bilateral 07/24/2016   Procedure: LEFT MASTECTOMY WITH LEFT SENTINEL LYMPH NODE BIOPSY, RIGHT PROPHYLACTIC MASTECTOMY;  Surgeon: Jovita Kussmaul, MD;  Location: [place];  Service: General;  Laterality: Bilateral;  . PORT-A-CATH REMOVAL N/A 11/10/2016   Procedure: REMOVAL PORT-A-CATH;  Surgeon: Jovita Kussmaul, MD;  Location: [place];  Service: General;  Laterality: N/A;  . PORTACATH PLACEMENT N/A 08/21/2016   Procedure: INSERTION PORT-A-CATH WITH [place];  Surgeon: Jovita Kussmaul, MD;  Location: [facility];  Service: General;  Laterality: N/A;  . RE-EXCISION OF BREAST LUMPECTOMY Right 06/23/2014   Procedure: RE-EXCISION OF RIGHT BREAST INFERIOR MEDIAL MARGIN AND SENTINEL LYMPH NODE BIOPSY;  Surgeon: Autumn Messing III, MD;  Location: [place] [facility];  Service: General;  Laterality: Right;    Family History  Problem Relation Age of Onset  . Colon cancer Mother        dx. late 12s  . Congestive Heart Failure Father        smoker  . Cancer Maternal Aunt        unspecified type; dx. 14s  .  Heart attack Maternal Uncle   . Breast cancer Paternal Aunt        dx. 48s  . Heart attack Paternal Uncle   . Heart attack Paternal Grandmother   . Heart attack Paternal Grandfather   . Congestive Heart Failure Paternal Aunt   . Cancer Cousin        unspecified type/unspecified type  . Breast cancer Cousin 72   Social History:  reports that  has never smoked. she has never used smokeless tobacco. She reports that she drinks alcohol. She  reports that she does not use drugs.  Allergies:  Allergies  Allergen Reactions  . Penicillins Hives, Swelling and Other (See Comments)     PATIENT HAS HAD A PCN REACTION WITH IMMEDIATE RASH, FACIAL/TONGUE/THROAT SWELLING, SOB, OR LIGHTHEADEDNESS WITH HYPOTENSION:  #  #  #  YES  #  #  #   Has patient had a PCN reaction causing severe rash involving mucus membranes or skin necrosis: No Has patient had a PCN reaction that required hospitalization: No Has patient had a PCN reaction occurring within the last 10 years: No If all of the above answers are "NO", then may proceed with Cephalosporin use.   . Shellfish Allergy Hives and Swelling  . Tetracyclines & Related Hives    Medications Prior to Admission  Medication Sig Dispense Refill  . anastrozole (ARIMIDEX) 1 MG tablet Take 1 mg by mouth daily.    Marland Kitchen atorvastatin (LIPITOR) 10 MG tablet Take 1 tablet (10 mg total) by mouth daily.    . diazepam (VALIUM) 2 MG tablet Take 2 mg by mouth every 8 (eight) hours as needed for anxiety.  0  . glimepiride (AMARYL) 4 MG tablet Take 1 tablet (4 mg total) by mouth daily with breakfast.    . HYDROcodone-acetaminophen (NORCO/VICODIN) 5-325 MG tablet Take 1 tablet by mouth every 6 (six) hours as needed for moderate pain.  0  . metFORMIN (GLUCOPHAGE) 1000 MG tablet Take 1,000 mg by mouth 2 (two) times daily with a meal.    . ondansetron (ZOFRAN) 4 MG tablet Take 4 mg by mouth every 8 (eight) hours as needed.  0    Results for orders placed or performed during the hospital encounter of 03/17/17 (from the past 48 hour(s))  Glucose, capillary     Status: Abnormal   Collection Time: 03/17/17  6:39 AM  Result Value Ref Range   Glucose-Capillary 132 (H) 65 - 99 mg/dL   No results found.  Review of Systems  Constitutional: Negative.   HENT: Negative.   Eyes: Negative.   Respiratory: Negative.   Cardiovascular: Negative.   Gastrointestinal: Negative.   Genitourinary: Negative.   Musculoskeletal:  Negative.   Skin: Negative.   Neurological: Negative.   Psychiatric/Behavioral: Negative.    Blood pressure (!) 131/91, pulse 93, temperature 98.1 F (36.7 C), temperature source Oral, resp. rate 20, height 5' 7.5" (1.715 m), weight 113 kg (249 lb 3.2 oz), last menstrual period 10/06/2014, SpO2 97 %. Physical Exam  Constitutional: She appears well-developed and well-nourished.  HENT:  Head: Normocephalic and atraumatic.  Eyes: Conjunctivae and EOM are normal. Pupils are equal, round, and reactive to light.  Cardiovascular: Normal rate.  Respiratory: Effort normal. No respiratory distress.  GI: Soft. She exhibits no distension. There is no tenderness.  Musculoskeletal: She exhibits no edema or tenderness.  Skin: Skin is warm.  Psychiatric: She has a normal mood and affect. Her behavior is normal. Judgment and thought content normal.  Assessment/Plan Plan for latissimus myocutaneous flap for right breast reconstruction with expander placement.  [place], DO 03/17/2017, 7:05 AM

## 2017-03-17 NOTE — Transfer of Care (Signed)
Immediate Anesthesia Transfer of Care Note  Patient: [name] [name]  Procedure(s) Performed: LATISSIMUS FLAP TO RIGHT CHEST [place] (Right Chest) BREAST RECONSTRUCTION WITH PLACEMENT OF TISSUE EXPANDER (Right Breast)  Patient Location: PACU  Anesthesia Type:General  Level of Consciousness: awake, oriented and patient cooperative  Airway & Oxygen Therapy: Patient Spontanous Breathing and Patient connected to nasal cannula oxygen  Post-op Assessment: Report given to RN and Post -op Vital signs reviewed and stable  Post vital signs: Reviewed  Last Vitals:  Vitals:   03/17/17 0636  BP: (!) 131/91  Pulse: 93  Resp: 20  Temp: 36.7 C  SpO2: 97%    Last Pain:  Vitals:   03/17/17 0636  TempSrc: Oral         Complications: No apparent anesthesia complications

## 2017-03-17 NOTE — Progress Notes (Signed)
Patient arrived to 6n19, alert and oriented, slightly drowsy. IV fluids running, scds on, VSS, o2 applied since oxygen level was slightly low. Patient noted to have a right breast incision with 3 JP drains all close to area, all with gauze, ABD pad and breast binder on. Patient family at bedside, oriented to room and staff, will continue to monitor.

## 2017-03-17 NOTE — Anesthesia Preprocedure Evaluation (Addendum)
Anesthesia Evaluation  Patient identified by MRN, date of birth, ID band Patient awake    Reviewed: Allergy & Precautions, H&P , NPO status , Patient's Chart, lab work & pertinent test results  Airway Mallampati: II  TM Distance: >3 FB Neck ROM: Full    Dental no notable dental hx. (+) Teeth Intact, Dental Advisory Given   Pulmonary    Pulmonary exam normal breath sounds clear to auscultation       Cardiovascular negative cardio ROS Normal cardiovascular exam Rhythm:Regular Rate:Normal     Neuro/Psych negative neurological ROS  negative psych ROS   GI/Hepatic negative GI ROS, Neg liver ROS,   Endo/Other  negative endocrine ROSdiabetes, Type 2, Oral Hypoglycemic AgentsMorbid obesity  Renal/GU negative Renal ROS  negative genitourinary   Musculoskeletal negative musculoskeletal ROS (+)   Abdominal (+) + obese,   Peds negative pediatric ROS (+)  Hematology negative hematology ROS (+)   Anesthesia Other Findings   Reproductive/Obstetrics negative OB ROS                            Anesthesia Physical  Anesthesia Plan  ASA: III  Anesthesia Plan: General   Post-op Pain Management:  Regional for Post-op pain   Induction: Intravenous  PONV Risk Score and Plan: 3 and Ondansetron, Dexamethasone, Midazolam and Propofol  Airway Management Planned: Oral ETT  Additional Equipment:   Intra-op Plan:   Post-operative Plan:   Informed Consent: I have reviewed the patients History and Physical, chart, labs and discussed the procedure including the risks, benefits and alternatives for the proposed anesthesia with the patient or authorized representative who has indicated his/her understanding and acceptance.   Dental advisory given  Plan Discussed with: CRNA and Surgeon  Anesthesia Plan Comments: (  )        Anesthesia Quick Evaluation                                   Anesthesia  Evaluation  Patient identified by MRN, date of birth, ID band Patient awake    Reviewed: Allergy & Precautions, H&P , NPO status , Patient's Chart, lab work & pertinent test results, reviewed documented beta blocker date and time   Airway Mallampati: II  TM Distance: >3 FB Neck ROM: Full    Dental no notable dental hx.    Pulmonary neg pulmonary ROS,    Pulmonary exam normal breath sounds clear to auscultation       Cardiovascular negative cardio ROS Normal cardiovascular exam Rhythm:Regular Rate:Normal     Neuro/Psych negative neurological ROS  negative psych ROS   GI/Hepatic negative GI ROS, Neg liver ROS,   Endo/Other  diabetes, Type 2Morbid obesity  Renal/GU negative Renal ROS  negative genitourinary   Musculoskeletal negative musculoskeletal ROS (+)   Abdominal   Peds negative pediatric ROS (+)  Hematology negative hematology ROS (+)   Anesthesia Other Findings   Reproductive/Obstetrics negative OB ROS                             Anesthesia Physical  Anesthesia Plan  ASA: III  Anesthesia Plan: General   Post-op Pain Management:  Regional for Post-op pain   Induction: Intravenous  PONV Risk Score and Plan: 2 and Ondansetron, Dexamethasone and Propofol  Airway Management Planned: Oral ETT  Additional Equipment:   Intra-op  Plan:   Post-operative Plan: Extubation in OR  Informed Consent: I have reviewed the patients History and Physical, chart, labs and discussed the procedure including the risks, benefits and alternatives for the proposed anesthesia with the patient or authorized representative who has indicated his/her understanding and acceptance.   Dental advisory given  Plan Discussed with: CRNA and Surgeon  Anesthesia Plan Comments: (  )       Anesthesia Quick Evaluation

## 2017-03-17 NOTE — Op Note (Addendum)
DATE OF OPERATION: 03/17/2017  LOCATION: [facility] In[facility]  PREOPERATIVE DIAGNOSIS: Breast Cancer s/p bilateral mastectomies   POSTOPERATIVE DIAGNOSIS: Same  PROCEDURE: 1. Latissimus myocutaneous flap  to reconstruct the right breast CPT 19361 2. Tissue expander placement CPT 19357 with placement of 250 cc 3. Right breast Capsulectomies  SURGEON: Claire Sanger Dillingham, DO  EBL: 150 cc  SPECIMEN: None  EXPANDER:  [id number], 535 cc, SN [id number]  DRAINS: 3 total 51 blake round drains  CONDITION: Stable  COMPLICATIONS: None  INDICATION: The patient, [name] [name], is a 52 y.o. female born on 1966-01-01, is here for treatment after a mastectomy.  She attempted to expand but the radiation was a strong limiting factor to her size.  She decided to undergo a latissimus reconstruction.    PROCEDURE DETAILS:  The patient was seen on the morning of her surgery and marked out for her flap.  She was given an IV and IV antibiotics. She was then taken to the operating room and given a general anesthetic. A standard time out was performed and all information was confirmed by those in the room. SCD's were placed. She was placed into the left lateral decubitus position with all key points padded. She was then prepped and draped in the standard sterile fashion using a chloroprep. The paddle design and position was confirmed. The procedure began by incising the margins of the paddle and dissecting out until all four margins of the muscle were identified. The muscle was then released inferiorly and anteriorly and care was taken not to pick up the serratus anteriorly or the paraspinous muscles posteriorly. The flap was then raised to the scapula and released and rotated medially. Care was taken to protect the vascular pedicle throughout this portion of the procedure. The paddle and muscle looked healthy throughout the case.  The old mastectomy scar was incised.  The pectoralis muscle was  adherent to the flaps and very thin.  For that reason the pectoralis muscle was left in place.  The posterior and anterior pockets were then connected in the plane above the muscle. The muscle from the back and the skin paddle were then rotated into the axilla. The flap pedicle was inspected and there was no tension. The back pocket was hemostased and Evicel placed posteriorly. Two #19 blake round drains were place and secured with 3-0 Silk. The back incision was closed with buried 3-0 Vicryl, followed by 4-0 Monocryl and 5-0 Monocryl.  Dermabond and a protective dressing was applied.   The patient was then repositioned onto her back and the chest was prepped and draped. The previous expander was removed. The breast pocket was inspected and hemostases was achieved with electrocautery. Extensive capsulectomies were need to improve the expansion and limit the adhesion.  The latissimus muscle was then secured superiorly and medially to the chest [place] and with 3-0 Monocryl. A 535 cc expander was chosen. It was soaked in triple antibiotic solution and evacuated of air.  The expander was secured in two placed on the chest [place] with the 3-0 PDS.  It was then filled with 250 cc of sterile saline. The inferior portion of the muscle was tacked to the inframammary fold with 3-0 Monocryl.  One drain was placed on this side and secured with 3-0 Silk. The flap was then closed with 3-0 Monocryl deep, followed by 4-0 Monocryl and the skin closed with 5-0 Monocryl.  Dermabond, ABDs and a breast binder was applied.    The patient was allowed  to wake up and taken to recovery room in stable condition at the end of the case. The family was notified at the end of the case.

## 2017-03-18 ENCOUNTER — Other Ambulatory Visit: Payer: Self-pay

## 2017-03-18 ENCOUNTER — Encounter (HOSPITAL_COMMUNITY): Payer: Self-pay | Admitting: General Practice

## 2017-03-18 DIAGNOSIS — Z421 Encounter for breast reconstruction following mastectomy: Secondary | ICD-10-CM | POA: Diagnosis not present

## 2017-03-18 LAB — BASIC METABOLIC PANEL
Anion gap: 13 (ref 5–15)
BUN: 17 mg/dL (ref 6–20)
CALCIUM: 8.8 mg/dL — AB (ref 8.9–10.3)
CO2: 24 mmol/L (ref 22–32)
CREATININE: 1.11 mg/dL — AB (ref 0.44–1.00)
Chloride: 102 mmol/L (ref 101–111)
GFR calc non Af Amer: 56 mL/min — ABNORMAL LOW (ref >60)
Glucose, Bld: 172 mg/dL — ABNORMAL HIGH (ref 65–99)
Potassium: 4.7 mmol/L (ref 3.5–5.1)
Sodium: 139 mmol/L (ref 135–145)

## 2017-03-18 LAB — CBC
HCT: 29.7 % — ABNORMAL LOW (ref 36.0–46.0)
Hemoglobin: 9.1 g/dL — ABNORMAL LOW (ref 12.0–15.0)
MCH: 25.4 pg — AB (ref 26.0–34.0)
MCHC: 30.6 g/dL (ref 30.0–36.0)
MCV: 83 fL (ref 78.0–100.0)
PLATELETS: 268 10*3/uL (ref 150–400)
RBC: 3.58 MIL/uL — AB (ref 3.87–5.11)
RDW: 17 % — ABNORMAL HIGH (ref 11.5–15.5)
WBC: 9.3 10*3/uL (ref 4.0–10.5)

## 2017-03-18 LAB — HIV ANTIBODY (ROUTINE TESTING W REFLEX): HIV SCREEN 4TH GENERATION: NONREACTIVE

## 2017-03-18 MED ORDER — METHOCARBAMOL 500 MG PO TABS
500.0000 mg | ORAL_TABLET | Freq: Four times a day (QID) | ORAL | Status: DC | PRN
  Administered 2017-03-18 – 2017-03-19 (×2): 500 mg via ORAL
  Filled 2017-03-18 (×2): qty 1

## 2017-03-18 NOTE — Progress Notes (Signed)
1 Day Post-Op   Subjective/Chief Complaint: Reports N/V last evening, but better today and has been able to eat and drink .  Getting up to bathroom with assistance and feels she is moving a little better than last night.   Right latissimus/breast flap viable and with good color. JP drainage as expected. Back incision intact, clean and dry.    Objective: Vital signs in last 24 hours: Temp:  [97.7 F (36.5 C)-98.5 F (36.9 C)] 97.7 F (36.5 C) (02/06 1425) Pulse Rate:  [85-99] 88 (02/06 1425) Resp:  [17-18] 18 (02/06 1104) BP: (108-147)/(74-92) 124/79 (02/06 1425) SpO2:  [94 %-99 %] 98 % (02/06 1425) Last BM Date: 03/16/17  Intake/Output from previous day: 02/05 0701 - 02/06 0700 In: 3681.7 [P.O.:480; I.V.:3001.7; IV Piggyback:200] Out: 2328 [Urine:1800; Drains:478; Blood:50] Intake/Output this shift: Total I/O In: 765 [I.V.:700; Other:65] Out: 68 [Drains:85]    Lab Results:  Recent Labs    03/17/17 0640 03/18/17 0621  WBC 4.6 9.3  HGB 9.8* 9.1*  HCT 32.1* 29.7*  PLT 270 268   BMET Recent Labs    03/18/17 0621  NA 139  K 4.7  CL 102  CO2 24  GLUCOSE 172*  BUN 17  CREATININE 1.11*  CALCIUM 8.8*   PT/INR No results for input(s): LABPROT, INR in the last 72 hours. ABG No results for input(s): PHART, HCO3 in the last 72 hours.  Invalid input(s): PCO2, PO2  Studies/Results: No results found.  Anti-infectives: Anti-infectives (From admission, onward)   Start     Dose/Rate Route Frequency Ordered Stop   03/17/17 2000  ciprofloxacin (CIPRO) IVPB 400 mg     400 mg 200 mL/hr over 60 Minutes Intravenous Every 12 hours 03/17/17 1203     03/17/17 0811  polymyxin B 500,000 Units, bacitracin 50,000 Units in sodium chloride 0.9 % 500 mL irrigation  Status:  Discontinued       As needed 03/17/17 0811 03/17/17 1055   03/17/17 0614  ciprofloxacin (CIPRO) 400 MG/200ML IVPB    Comments:  Tamsen Snider   : cabinet override      03/17/17 0614 03/17/17 0745   03/17/17 0611  ciprofloxacin (CIPRO) IVPB 400 mg     400 mg 200 mL/hr over 60 Minutes Intravenous On call to O.R. 03/17/17 0611 03/17/17 0815      Assessment/Plan: s/p Procedure(s): LATISSIMUS FLAP TO RIGHT CHEST [place] (Right) BREAST RECONSTRUCTION WITH PLACEMENT OF TISSUE EXPANDER (Right) Continue to work on mobilizing out of bed  Continue but decrease IVF and encouraged po intake.  Continue JP drains.  Near max dose of tylenol, so will add Robaxin to aid with pain control. Likely DC over next couple of days if mobile, pain under control with po and tolerating po's.  LOS: 1 day    [facility] Plastic Surgery [phone]

## 2017-03-19 DIAGNOSIS — Z421 Encounter for breast reconstruction following mastectomy: Secondary | ICD-10-CM | POA: Diagnosis not present

## 2017-03-19 MED ORDER — POLYETHYLENE GLYCOL 3350 17 G PO PACK: 17.0000 g | PACK | Freq: Every day | ORAL | 0 refills | Status: DC | PRN

## 2017-03-19 MED ORDER — METHOCARBAMOL 500 MG PO TABS: 500.0000 mg | ORAL_TABLET | Freq: Four times a day (QID) | ORAL | 1 refills | Status: DC | PRN

## 2017-03-19 MED ORDER — SENNA 8.6 MG PO TABS: 1.0000 | ORAL_TABLET | Freq: Two times a day (BID) | ORAL | 0 refills | Status: DC

## 2017-03-19 NOTE — Progress Notes (Signed)
2 Days Post-Op   Subjective/Chief Complaint: Doing well with mobilizing out of bed and no nausea today.   Right latissimus and breast flaps are viable and without signs of infection,seroma or hematoma.  Incisions over right breast and right back, clean, dry and intact.  JP drainage as expected.    Objective: Vital signs in last 24 hours: Temp:  [98.2 F (36.8 C)-98.5 F (36.9 C)] 98.2 F (36.8 C) (02/07 1400) Pulse Rate:  [79-88] 88 (02/07 1400) Resp:  [16-18] 18 (02/07 1400) BP: (122-137)/(70-84) 123/74 (02/07 1400) SpO2:  [96 %-98 %] 97 % (02/07 1400) Last BM Date: 03/16/17  Intake/Output from previous day: 02/06 0701 - 02/07 0700 In: 2267.5 [P.O.:120; I.V.:1682.5; IV Piggyback:400] Out: 360 [Drains:360] Intake/Output this shift: Total I/O In: -  Out: 125 [Drains:125]    Lab Results:  Recent Labs    03/17/17 0640 03/18/17 0621  WBC 4.6 9.3  HGB 9.8* 9.1*  HCT 32.1* 29.7*  PLT 270 268   BMET Recent Labs    03/18/17 0621  NA 139  K 4.7  CL 102  CO2 24  GLUCOSE 172*  BUN 17  CREATININE 1.11*  CALCIUM 8.8*   PT/INR No results for input(s): LABPROT, INR in the last 72 hours. ABG No results for input(s): PHART, HCO3 in the last 72 hours.  Invalid input(s): PCO2, PO2  Studies/Results: No results found.  Anti-infectives: Anti-infectives (From admission, onward)   Start     Dose/Rate Route Frequency Ordered Stop   03/17/17 2000  ciprofloxacin (CIPRO) IVPB 400 mg     400 mg 200 mL/hr over 60 Minutes Intravenous Every 12 hours 03/17/17 1203     03/17/17 0811  polymyxin B 500,000 Units, bacitracin 50,000 Units in sodium chloride 0.9 % 500 mL irrigation  Status:  Discontinued       As needed 03/17/17 0811 03/17/17 1055   03/17/17 0614  ciprofloxacin (CIPRO) 400 MG/200ML IVPB    Comments:  Tamsen Snider   : cabinet override      03/17/17 0614 03/17/17 0745   03/17/17 0611  ciprofloxacin (CIPRO) IVPB 400 mg     400 mg 200 mL/hr over 60 Minutes  Intravenous On call to O.R. 03/17/17 0611 03/17/17 0815      Assessment/Plan: s/p Procedure(s): LATISSIMUS FLAP TO RIGHT CHEST [place] (Right) BREAST RECONSTRUCTION WITH PLACEMENT OF TISSUE EXPANDER (Right) Plan for discharge today.   Patient has follow up and medications already, but would like Robaxin for daytime.    LOS: 2 days   Areen Trautner,PA-C Plastic Surgery [phone]

## 2017-03-19 NOTE — Discharge Instructions (Signed)
Record drainage amounts and bring log to office visit No lifting more than 5 lbs.  May use and move arms to tolerance.

## 2017-03-20 NOTE — Discharge Summary (Signed)
Physician Discharge Summary  Patient ID: [name] [name] MRN: [id number] DOB/AGE: May 04, 1965 52 y.o.  Admit date: 03/17/2017 Discharge date: 03/20/2017  Admission Diagnoses: Acquired absence of right breast  Discharge Diagnoses:  Active Problems:   Acquired absence of bilateral breasts and nipples   Discharged Condition: good  Hospital Course: The patient is a 52 y.o. yrs old bf here for revision of her reconstruction with right breast latissimus dorsi flap and tissue expander exchange. She underwent bilateral mastectomies with right sided radiation. This has limited the ability to expand the right side. The patient has expressed some concern about not being satisfied with the size and wanted to go larger and has elected to undergo a right latissimus flap. She will also need work on the medial aspect of both breasts. Expanders currently are 420/300 cc. She completed chemotherapy (Taxotere and Cytoxan) in Sept. 2018. She is on antiestrogen therapy now, anastrozole.   History: RIGHT upper-outer quadrant breast cancer, ER/PR positive, Her 2 negative. She underwent right breast lumpectomy with radiation (08/2014) and tamoxifen therapy.Mammogram was negative but MRI showed a 6.9 cm area of enhancement. Biopsy = invasive lobular cancer and on 05/20/16 the LEFT breast invasive lobular cancer with LCIS, Ki-67 20%, HER-2 negative. The Tamoxifen was stopped 4/26 and switched to anastrozole daily. She is 5 feet 7 inches tall, weight is 244 pounds. Preop bra= 38C.   The patient was admitted and underwent revision of her right breast reconstruction with right breast latissimus dorsi flap and tissue expander exchange. She did well post operatively and is medically stable for discharge home.  Consults: None  Treatments: surgery: Right latissimus dorsi flap reconstruction for right breast with exchange of tissue expander  Discharge Exam: Blood pressure 123/74, pulse 88, temperature 98.2 F (36.8 C),  temperature source Oral, resp. rate 18, height _0  (1.702 m), weight 113 kg (249 lb 1.9 oz), last menstrual period 10/06/2014, SpO2 97 %. General appearance: alert, cooperative, appears stated age and no distress Resp: clear to auscultation bilaterally Cardio: regular rate and rhythm Right latissimus dorsi/breast flaps are viable and without signs of infection, seroma or hematoma.   Disposition: 01-Home or Self Care  Discharge Instructions    Call MD for:  persistant nausea and vomiting   Complete by:  As directed    Call MD for:  redness, tenderness, or signs of infection (pain, swelling, redness, odor or green/yellow discharge around incision site)   Complete by:  As directed    Call MD for:  severe uncontrolled pain   Complete by:  As directed    Call MD for:  temperature >100.4   Complete by:  As directed    Diet - low sodium heart healthy   Complete by:  As directed    Discharge instructions   Complete by:  As directed    Record drainage amounts from JP drains and bring log to office appointment   Increase activity slowly   Complete by:  As directed    No lifting more than 5 lbs.  May move arms to tolerance.  Walk at least several times daily     Allergies as of 03/19/2017      Reactions   Penicillins Hives, Swelling, Other (See Comments)   PATIENT HAS HAD A PCN REACTION WITH IMMEDIATE RASH, FACIAL/TONGUE/THROAT SWELLING, SOB, OR LIGHTHEADEDNESS WITH HYPOTENSION:  #  #  #  YES  #  #  #   Has patient had a PCN reaction causing severe rash involving mucus membranes or skin  necrosis: No Has patient had a PCN reaction that required hospitalization: No Has patient had a PCN reaction occurring within the last 10 years: No If all of the above answers are "NO", then may proceed with Cephalosporin use.   Shellfish Allergy Hives, Swelling   Tetracyclines & Related Hives      Medication List    TAKE these medications   anastrozole 1 MG tablet Commonly known as:  ARIMIDEX Take  1 mg by mouth daily.   atorvastatin 10 MG tablet Commonly known as:  LIPITOR Take 1 tablet (10 mg total) by mouth daily.   diazepam 2 MG tablet Commonly known as:  VALIUM Take 2 mg by mouth every 8 (eight) hours as needed for anxiety.   glimepiride 4 MG tablet Commonly known as:  AMARYL Take 1 tablet (4 mg total) by mouth daily with breakfast.   HYDROcodone-acetaminophen 5-325 MG tablet Commonly known as:  NORCO/VICODIN Take 1 tablet by mouth every 6 (six) hours as needed for moderate pain.   metFORMIN 1000 MG tablet Commonly known as:  GLUCOPHAGE Take 1,000 mg by mouth 2 (two) times daily with a meal.   methocarbamol 500 MG tablet Commonly known as:  ROBAXIN Take 1 tablet (500 mg total) by mouth every 6 (six) hours as needed for muscle spasms.   ondansetron 4 MG tablet Commonly known as:  ZOFRAN Take 4 mg by mouth every 8 (eight) hours as needed.   polyethylene glycol packet Commonly known as:  MIRALAX / GLYCOLAX Take 17 g by mouth daily as needed for mild constipation.   senna 8.6 MG Tabs tablet Commonly known as:  SENOKOT Take 1 tablet (8.6 mg total) by mouth 2 (two) times daily.      Follow-up Information    Dillingham, Loel Lofty, DO In 1 week.   Specialty:  Plastic Surgery Contact information: [place] [address] [phone]           Signed: [facility] [phone]

## 2017-03-24 DIAGNOSIS — Z923 Personal history of irradiation: Secondary | ICD-10-CM | POA: Insufficient documentation

## 2017-04-17 ENCOUNTER — Ambulatory Visit: Payer: 59 | Admitting: Hematology and Oncology

## 2017-04-20 DIAGNOSIS — E119 Type 2 diabetes mellitus without complications: Secondary | ICD-10-CM | POA: Diagnosis not present

## 2017-05-01 DIAGNOSIS — L7634 Postprocedural seroma of skin and subcutaneous tissue following other procedure: Secondary | ICD-10-CM | POA: Diagnosis not present

## 2017-05-01 DIAGNOSIS — Z853 Personal history of malignant neoplasm of breast: Secondary | ICD-10-CM | POA: Diagnosis not present

## 2017-05-01 DIAGNOSIS — Z9013 Acquired absence of bilateral breasts and nipples: Secondary | ICD-10-CM | POA: Diagnosis not present

## 2017-05-14 ENCOUNTER — Other Ambulatory Visit: Payer: Self-pay

## 2017-05-14 ENCOUNTER — Encounter: Payer: Self-pay | Admitting: Physical Therapy

## 2017-05-14 ENCOUNTER — Ambulatory Visit: Payer: 59 | Attending: Plastic Surgery | Admitting: Physical Therapy

## 2017-05-14 DIAGNOSIS — M25611 Stiffness of right shoulder, not elsewhere classified: Secondary | ICD-10-CM

## 2017-05-14 DIAGNOSIS — M6281 Muscle weakness (generalized): Secondary | ICD-10-CM

## 2017-05-14 DIAGNOSIS — R600 Localized edema: Secondary | ICD-10-CM | POA: Diagnosis not present

## 2017-05-14 DIAGNOSIS — M25612 Stiffness of left shoulder, not elsewhere classified: Secondary | ICD-10-CM | POA: Diagnosis not present

## 2017-05-14 DIAGNOSIS — R293 Abnormal posture: Secondary | ICD-10-CM

## 2017-05-14 NOTE — Therapy (Signed)
[place], [place], [address] Phone: [phone]   Fax:  [phone]  Physical Therapy Evaluation  Patient Details  Name: [name] [name] MRN: [id number] Date of Birth: [birth year] Referring Provider: Dr. Marla Roe   Encounter Date: 05/14/2017  PT End of Session - 05/14/17 1651    Visit Number  1    Number of Visits  9    Date for PT Re-Evaluation  06/11/17    PT Start Time  1602    PT Stop Time  1650    PT Time Calculation (min)  48 min    Activity Tolerance  Patient tolerated treatment well    Behavior During Therapy  [facility] for tasks assessed/performed       Past Medical History:  Diagnosis Date  . Breast cancer ([place]) 2016   right breast cancer, 2018- Left  . Diabetes mellitus without complication ([place])    TYpe II  . History of radiation therapy 2016  . Pneumonia 04/2016    Past Surgical History:  Procedure Laterality Date  . AXILLARY SENTINEL NODE BIOPSY Right 06/23/2014   Procedure: AXILLARY SENTINEL NODE BIOPSY;  Surgeon: Autumn Messing III, MD;  Location: [place];  Service: General;  Laterality: Right;  . BREAST LUMPECTOMY WITH NEEDLE LOCALIZATION Right 06/05/2014   Procedure: RIGHT BREAST LUMPECTOMY WITH NEEDLE LOCALIZATION;  Surgeon: Autumn Messing III, MD;  Location: [place];  Service: General;  Laterality: Right;  . BREAST RECONSTRUCTION WITH PLACEMENT OF TISSUE EXPANDER AND FLEX HD (ACELLULAR HYDRATED DERMIS) Bilateral 07/24/2016   Procedure: IMMEDIATE BREAST RECONSTRUCTION WITH PLACEMENT OF TISSUE EXPANDER AND FLEX HD (ACELLULAR HYDRATED DERMIS);  Surgeon: Wallace Going, DO;  Location: [place];  Service: Plastics;  Laterality: Bilateral;  . BREAST SURGERY  03/17/2017   LATISSIMUS FLAP TO RIGHT CHEST [place] (Right Chest)  . LATISSIMUS FLAP TO BREAST Right 03/17/2017   Procedure: LATISSIMUS FLAP TO RIGHT CHEST [place];  Surgeon: Wallace Going,  DO;  Location: [place];  Service: Plastics;  Laterality: Right;  . MASTECTOMY W/ SENTINEL NODE BIOPSY Bilateral 07/24/2016   Procedure: LEFT MASTECTOMY WITH LEFT SENTINEL LYMPH NODE BIOPSY, RIGHT PROPHYLACTIC MASTECTOMY;  Surgeon: Jovita Kussmaul, MD;  Location: [place];  Service: General;  Laterality: Bilateral;  . PORT-A-CATH REMOVAL N/A 11/10/2016   Procedure: REMOVAL PORT-A-CATH;  Surgeon: Jovita Kussmaul, MD;  Location: [facility];  Service: General;  Laterality: N/A;  . PORTACATH PLACEMENT N/A 08/21/2016   Procedure: INSERTION PORT-A-CATH WITH [place];  Surgeon: Jovita Kussmaul, MD;  Location: [place];  Service: General;  Laterality: N/A;  . RE-EXCISION OF BREAST LUMPECTOMY Right 06/23/2014   Procedure: RE-EXCISION OF RIGHT BREAST INFERIOR MEDIAL MARGIN AND SENTINEL LYMPH NODE BIOPSY;  Surgeon: Autumn Messing III, MD;  Location: [place];  Service: General;  Laterality: Right;    There were no vitals filed for this visit.   Subjective Assessment - 05/14/17 1608    Subjective  My left arm is swelling and there is a burning pain in my forearm. My left arm started swelling last week. It started with a tingling sensation and when I pressed down on it it felt like a burning sensation.     Pertinent History  diabetes, R breast cancer ER/PR positive, Her 2 negative, right breast lumpectomy with SLNB July 2016 completed radiation, July 2018 bilateral mastectomy and completed chemotherapy with expanders placed then underwent a latissiums dorsi flap on 03/17/17, pt currently  takng anastrozole    Patient Stated Goals  improve mobility and decrease swelling    Currently in Pain?  Yes    Pain Score  7     Pain Location  Arm    Pain Orientation  Distal    Pain Descriptors / Indicators  Aching;Tingling;Burning    Pain Type  Acute pain    Pain Onset  In the past 7 days    Pain Frequency  Constant    Aggravating Factors   pressing on it    Pain Relieving Factors  moving arm    Effect of Pain  on Daily Activities  doesn't          [facility] PT Assessment - 05/14/17 0001      Assessment   Medical Diagnosis  left and right breast cancer    Referring Provider  Dr. Marla Roe    Onset Date/Surgical Date  08/24/16    Hand Dominance  Right    Prior Therapy  none      Precautions   Precautions  Other (comment)    Precaution Comments  at risk for lymphedema bilaterally      Restrictions   Weight Bearing Restrictions  No      Balance Screen   Has the patient fallen in the past 6 months  No    Has the patient had a decrease in activity level because of a fear of falling?   No    Is the patient reluctant to leave their home because of a fear of falling?   No      Home Film/video editor residence    Living Arrangements  Spouse/significant other    Available Help at Discharge  Family    Type of [place] to enter    Entrance Stairs-Number of Steps  16    Entrance Stairs-Rails  Can reach both    [place]  One level      Prior Function   Level of Independence  Independent    Vocation  Full time employment    Vocation Requirements  all desk work    Leisure  pt states she does not exercise      Cognition   Overall Cognitive Status  Within Functional Limits for tasks assessed      Posture/Postural Control   Posture/Postural Control  Postural limitations    Postural Limitations  Rounded Shoulders;Forward head      AROM   Right/Left Shoulder  Right;Left    Right Shoulder Flexion  158 Degrees    Right Shoulder ABduction  135 Degrees    Right Shoulder Internal Rotation  67 Degrees    Right Shoulder External Rotation  74 Degrees    Left Shoulder Flexion  170 Degrees    Left Shoulder ABduction  125 Degrees    Left Shoulder Internal Rotation  64 Degrees    Left Shoulder External Rotation  68 Degrees        LYMPHEDEMA/ONCOLOGY QUESTIONNAIRE - 05/14/17 1627      Type   Cancer Type  left and right breast cancer       Surgeries   Mastectomy Date  08/10/16 left    Lumpectomy Date  07/12/14 right    Sentinel Lymph Node Biopsy Date  08/10/16 07/12/2014    Number Lymph Nodes Removed  3 on L, 4 on R      Date Lymphedema/Swelling Started   Date  05/07/17      Treatment   Active Chemotherapy Treatment  No    Past Chemotherapy Treatment  Yes    Active Radiation Treatment  No    Past Radiation Treatment  Yes    Current Hormone Treatment  Yes    Drug Name  Anastrozole    Past Hormone Therapy  No      What other symptoms do you have   Are you Having Heaviness or Tightness  Yes    Are you having Pain  Yes    Are you having pitting edema  No    Is it Hard or Difficult finding clothes that fit  No    Do you have infections  No    Is there Decreased scar mobility  Yes      Lymphedema Assessments   Lymphedema Assessments  Upper extremities      Right Upper Extremity Lymphedema   15 cm Proximal to Olecranon Process  41 cm    Olecranon Process  31 cm    15 cm Proximal to Ulnar Styloid Process  28.6 cm    Just Proximal to Ulnar Styloid Process  19.2 cm    Across Hand at [employer]  21 cm    At [place] of 2nd Digit  7.5 cm      Left Upper Extremity Lymphedema   15 cm Proximal to Olecranon Process  41.6 cm    Olecranon Process  31.5 cm    15 cm Proximal to Ulnar Styloid Process  28.6 cm    Just Proximal to Ulnar Styloid Process  20.5 cm    Across Hand at [employer]  21 cm    At [place] of 2nd Digit  7.5 cm          Quick Dash - 05/14/17 0001    Open a tight or new jar  Moderate difficulty    Do heavy household chores (wash walls, wash floors)  Moderate difficulty    Carry a shopping bag or briefcase  Moderate difficulty    Wash your back  Moderate difficulty    Use a knife to cut food  Mild difficulty    Recreational activities in which you take some force or impact through your arm, shoulder, or hand (golf, hammering, tennis)  Moderate difficulty    During the past week, to what extent  has your arm, shoulder or hand problem interfered with your normal social activities with family, friends, neighbors, or groups?  Not at all    During the past week, to what extent has your arm, shoulder or hand problem limited your work or other regular daily activities  Not at all    Arm, shoulder, or hand pain.  Moderate    Tingling (pins and needles) in your arm, shoulder, or hand  Severe    Difficulty Sleeping  Moderate difficulty    DASH Score  40.91 %        [facility]patient Re[facility]14/[facility]  Lymphedema Life Impact Scale Total Score  30.88 %      Objective measurements completed on examination: See above findings.      [facility] Adult PT Treatment/Exercise - 05/14/17 0001      Manual Therapy   Manual Therapy  Edema management    Edema Management  cut piece of TG soft for pt to wear on L forearm for swelling             PT  Education - 05/14/17 1701    Education provided  Yes    Education Details  educated pt to have blood pressure taken at ankle, educated pt to wear purse on R side, educated about wearing TG for comfort    Person(s) Educated  Patient    Methods  Explanation    Comprehension  Verbalized understanding          PT Long Term Goals - 05/14/17 1656      PT LONG TERM GOAL #1   Title  Pt will be independent in a home exercise program for continued strengthening and stretching    Time  4    Period  Weeks    Status  New    Target Date  06/11/17      PT LONG TERM GOAL #2   Title  Pt will report a 75% improvement in pain in left forearm due to swelling to allow improved comfort    Time  4    Period  Weeks    Status  New    Target Date  06/11/17      PT LONG TERM GOAL #3   Title  Pt will report a 75% improvement in swelling in left forearm to decrease risk of infection    Time  4    Period  Weeks    Status  New    Target Date  06/11/17      PT LONG TERM GOAL #4   Title  Pt will receive appropriate  compression garments from DME provider for long term management of edema    Time  4    Period  Weeks    Status  New    Target Date  06/11/17      PT LONG TERM GOAL #5   Title  Pt will be independent in self MLD for LUE to allow pt to manage her swelling independently    Time  4    Period  Weeks    Status  New    Target Date  06/11/17      Additional Long Term Goals   Additional Long Term Goals  Yes      PT LONG TERM GOAL #6   Title  Pt to demosntrate 145 degrees of bilateral shoulder abduction to allow pt to reach out to sides    Baseline  R 125 L 135    Time  4    Period  Weeks    Status  New    Target Date  06/11/17      PT LONG TERM GOAL #7   Title  Pt will demonstrate 165 degrees of right shoulder flexion to allow her to reach items overhead    Baseline  158    Time  4    Period  Weeks    Status  New    Target Date  06/11/17             Plan - 05/14/17 1651    Clinical Impression Statement  Pt presents to PT with increased left forearm swelling and decreased bilateral shoulder ROM following treamtents for bilateral breast cancer. Pt underwent a right lumpectomy and SLNB in 2016 for treatment of right breast cancer and then underwent a bilateral mastectomy and L SLNB in 2018 for treatment of left breast cancer. She had expanders placed in 2018 and then underwent a lat flap surgery for reconstruction in Feb of 2019. She reports tightness in her lat scar. She also has limited  bilateral shoulder ROM and increased forearm swelling. She reports she has been having her blood pressure taken on the LUE at the wrist and carries her purse on her left side. Her left forearm is visbily swollen compared to right though there is not a significant measurable difference. Pt would benefit from skilled PT services to decrease left UE swelling and improve bilateral shoulder ROM and decrease pain in left forearm.     History and Personal Factors relevant to plan of care:  Diabetes     Clinical Presentation  Stable    Clinical Presentation due to:  pt has completed treatment - radiation and chemo    Rehab Potential  Good    Clinical Impairments Affecting Rehab Potential  hx of radiation in 2016    PT Frequency  2x / week    PT Duration  4 weeks    PT Treatment/Interventions  ADLs/Self Care Home Management;Therapeutic activities;Therapeutic exercise;Patient/family education;Manual lymph drainage;Manual techniques;Orthotic Fit/Training;Compression bandaging;Scar mobilization;Passive range of motion;Vasopneumatic Device    PT Next Visit Plan  see if TG soft helped, teach pt LUE MLD, give dowel exercises, instruct in lymph risk reduction handout, send Rx to get compression sleeve and glove. review anatomy and physiology of lymphatic system and explain what causes lymphedema    Consulted and Agree with Plan of Care  Patient       Patient will benefit from skilled therapeutic intervention in order to improve the following deficits and impairments:  Increased edema, Decreased range of motion, Decreased strength, Postural dysfunction, Decreased scar mobility, Pain  Visit Diagnosis: Localized edema  Stiffness of right shoulder, not elsewhere classified  Stiffness of left shoulder, not elsewhere classified  Muscle weakness (generalized)  Abnormal posture     Problem List Patient Active Problem List   Diagnosis Date Noted  . Acquired absence of bilateral breasts and nipples 03/17/2017  . Port catheter in place 09/01/2016  . Malignant neoplasm of upper-inner quadrant of left breast in female, estrogen receptor positive ([place]) 07/31/2016  . Breast cancer ([place]) 07/24/2016  . Genetic testing 11/27/2014  . Family history of breast cancer in female 11/02/2014  . Family history of colon cancer in mother 11/02/2014  . Breast cancer of upper-outer quadrant of right female breast ([place]) 04/26/2014    Allyson Sabal [facility] 05/14/2017, 5:04 PM  [place], [place], [address] Phone: [phone]   Fax:  [phone]  Name: [name] [name] MRN: [id number] Date of Birth: February 08, 1966  Manus Gunning, PT 05/14/17 5:04 PM

## 2017-05-18 ENCOUNTER — Ambulatory Visit: Payer: 59 | Admitting: Physical Therapy

## 2017-05-18 DIAGNOSIS — R600 Localized edema: Secondary | ICD-10-CM

## 2017-05-18 NOTE — Therapy (Signed)
[place], [place], [address] Phone: [phone]   Fax:  [phone]  Physical Therapy Treatment  Patient Details  Name: [name] [name] MRN: [id number] Date of Birth: 28-Mar-1965 Referring Provider: Dr. Marla Roe   Encounter Date: 05/18/2017  PT End of Session - 05/18/17 1658    Visit Number  2    Number of Visits  9    Date for PT Re-Evaluation  06/11/17    PT Start Time  1612 pt. was late    PT Stop Time  1655    PT Time Calculation (min)  43 min    Activity Tolerance  Patient tolerated treatment well    Behavior During Therapy  [facility] for tasks assessed/performed       Past Medical History:  Diagnosis Date  . Breast cancer ([place]) 2016   right breast cancer, 2018- Left  . Diabetes mellitus without complication ([employer])    TYpe II  . History of radiation therapy 2016  . Pneumonia 04/2016    Past Surgical History:  Procedure Laterality Date  . AXILLARY SENTINEL NODE BIOPSY Right 06/23/2014   Procedure: AXILLARY SENTINEL NODE BIOPSY;  Surgeon: Autumn Messing III, MD;  Location: [place];  Service: General;  Laterality: Right;  . BREAST LUMPECTOMY WITH NEEDLE LOCALIZATION Right 06/05/2014   Procedure: RIGHT BREAST LUMPECTOMY WITH NEEDLE LOCALIZATION;  Surgeon: Autumn Messing III, MD;  Location: [place];  Service: General;  Laterality: Right;  . BREAST RECONSTRUCTION WITH PLACEMENT OF TISSUE EXPANDER AND FLEX HD (ACELLULAR HYDRATED DERMIS) Bilateral 07/24/2016   Procedure: IMMEDIATE BREAST RECONSTRUCTION WITH PLACEMENT OF TISSUE EXPANDER AND FLEX HD (ACELLULAR HYDRATED DERMIS);  Surgeon: Wallace Going, DO;  Location: [place];  Service: Plastics;  Laterality: Bilateral;  . BREAST SURGERY  03/17/2017   LATISSIMUS FLAP TO RIGHT CHEST [place] (Right Chest)  . LATISSIMUS FLAP TO BREAST Right 03/17/2017   Procedure: LATISSIMUS FLAP TO RIGHT CHEST [place];  Surgeon:  Wallace Going, DO;  Location: [facility];  Service: Plastics;  Laterality: Right;  . MASTECTOMY W/ SENTINEL NODE BIOPSY Bilateral 07/24/2016   Procedure: LEFT MASTECTOMY WITH LEFT SENTINEL LYMPH NODE BIOPSY, RIGHT PROPHYLACTIC MASTECTOMY;  Surgeon: Jovita Kussmaul, MD;  Location: [place];  Service: General;  Laterality: Bilateral;  . PORT-A-CATH REMOVAL N/A 11/10/2016   Procedure: REMOVAL PORT-A-CATH;  Surgeon: Jovita Kussmaul, MD;  Location: [place] [facility];  Service: General;  Laterality: N/A;  . PORTACATH PLACEMENT N/A 08/21/2016   Procedure: INSERTION PORT-A-CATH WITH [place];  Surgeon: Jovita Kussmaul, MD;  Location: [facility];  Service: General;  Laterality: N/A;  . RE-EXCISION OF BREAST LUMPECTOMY Right 06/23/2014   Procedure: RE-EXCISION OF RIGHT BREAST INFERIOR MEDIAL MARGIN AND SENTINEL LYMPH NODE BIOPSY;  Surgeon: Autumn Messing III, MD;  Location: [facility];  Service: General;  Laterality: Right;    There were no vitals filed for this visit.  Subjective Assessment - 05/18/17 1613    Subjective  "It's still the same, still swollen and tender right in here" (dorsal forearm).  She felt the TG soft helped to kind of relieve some of the pressure, so I slept better.    Pertinent History  diabetes, R breast cancer ER/PR positive, Her 2 negative, right breast lumpectomy with SLNB July 2016 completed radiation, July 2018 bilateral mastectomy and completed chemotherapy with expanders placed then underwent a latissiums dorsi flap on 03/17/17, pt currently takng anastrozole    Patient Stated  Goals  improve mobility and decrease swelling    Currently in Pain?  Yes    Pain Location  Arm    Pain Orientation  Left    Pain Descriptors / Indicators  Sharp    Aggravating Factors   having something rub up against the forearm, like TG soft    Pain Relieving Factors  moving arm                  Outpatient Rehab from [facility]14/2017 [facility]  Lymphedema  Life Impact Scale Total Score  30.88 %           [facility] Adult PT Treatment/Exercise - 05/18/17 0001      Self-Care   Self-Care  Other Self-Care Comments    Other Self-Care Comments   Gave patient Rx for 20-30 mmHg compression sleeve and gauntlet or glove for her to take to have Dr. Marla Roe sign at her appointment on Wednesday; began discussing where and how to obtain this with her [facility] insurance (probably with [employer])      Manual Therapy   Manual Therapy  Manual Lymphatic Drainage (MLD)    Edema Management  issued new TG soft, both Medium (probably a little loose) and small, and gave to patient.    Manual Lymphatic Drainage (MLD)  Began educating patient about principles and techniques of manual lymph drainage, then performed the following in supine:  short neck, had patient take 5 diaphragmatic breaths with hands resting on her abdomen, left groin and axillo-inguinal anastomosis, and left UE from proximal to distal, then retracing steps.             PT Education - 05/18/17 1629    Education provided  Yes    Education Details  lymphedema risk reduction practices; began educating about where and how to obtain compression sleeve and gauntlet or glove    Person(s) Educated  Patient    Methods  Explanation;Handout NLN risk reduction handout; [facility] Lymphedema pamphlet    Comprehension  Verbalized understanding          PT Long Term Goals - 05/14/17 1656      PT LONG TERM GOAL #1   Title  Pt will be independent in a home exercise program for continued strengthening and stretching    Time  4    Period  Weeks    Status  New    Target Date  06/11/17      PT LONG TERM GOAL #2   Title  Pt will report a 75% improvement in pain in left forearm due to swelling to allow improved comfort    Time  4    Period  Weeks    Status  New    Target Date  06/11/17      PT LONG TERM GOAL #3   Title  Pt will report a 75% improvement in swelling in left forearm  to decrease risk of infection    Time  4    Period  Weeks    Status  New    Target Date  06/11/17      PT LONG TERM GOAL #4   Title  Pt will receive appropriate compression garments from DME provider for long term management of edema    Time  4    Period  Weeks    Status  New    Target Date  06/11/17      PT LONG TERM GOAL #5  Title  Pt will be independent in self MLD for LUE to allow pt to manage her swelling independently    Time  4    Period  Weeks    Status  New    Target Date  06/11/17      Additional Long Term Goals   Additional Long Term Goals  Yes      PT LONG TERM GOAL #6   Title  Pt to demosntrate 145 degrees of bilateral shoulder abduction to allow pt to reach out to sides    Baseline  R 125 L 135    Time  4    Period  Weeks    Status  New    Target Date  06/11/17      PT LONG TERM GOAL #7   Title  Pt will demonstrate 165 degrees of right shoulder flexion to allow her to reach items overhead    Baseline  158    Time  4    Period  Weeks    Status  New    Target Date  06/11/17            Plan - 05/18/17 1658    Clinical Impression Statement  Pt. was attentive for instruction in lymphedema risk reduction practices with handout from [facility], including education about infection signs, treatment, and prevention. Brochure also given on lymphedema from [facility].  Basic instruction given on manual lymph drainage and this was performed for her, stimulating left inguinal but NOT right axillary nodes. Also gave her a prescription for compression garments to have Dr. Marla Roe sign on Wednesday at appointment with her and began educating about garments and how to obtain.    Rehab Potential  Good    Clinical Impairments Affecting Rehab Potential  hx of radiation in 2016    PT Frequency  2x / week    PT Duration  4 weeks    PT Treatment/Interventions  ADLs/Self Care Home Management;Therapeutic activities;Therapeutic  exercise;Patient/family education;Manual lymph drainage;Manual techniques;Orthotic Fit/Training;Compression bandaging;Scar mobilization;Passive range of motion;Vasopneumatic Device    PT Next Visit Plan  Have patient perform left UE manual lymph drainage and give handout; give dowel exercises.    Recommended Other Services  emailed [employer] today about measuring her for sleeve and gauntlet    Consulted and Agree with Plan of Care  Patient       Patient will benefit from skilled therapeutic intervention in order to improve the following deficits and impairments:  Increased edema, Decreased range of motion, Decreased strength, Postural dysfunction, Decreased scar mobility, Pain  Visit Diagnosis: Localized edema     Problem List Patient Active Problem List   Diagnosis Date Noted  . Acquired absence of bilateral breasts and nipples 03/17/2017  . Port catheter in place 09/01/2016  . Malignant neoplasm of upper-inner quadrant of left breast in female, estrogen receptor positive ([place]) 07/31/2016  . Breast cancer ([place]) 07/24/2016  . Genetic testing 11/27/2014  . Family history of breast cancer in female 11/02/2014  . Family history of colon cancer in mother 11/02/2014  . Breast cancer of upper-outer quadrant of right female breast ([place]) 04/26/2014    SALISBURY,DONNA 05/18/2017, 5:07 PM  [place], [place], [address] Phone: [phone]   Fax:  [phone]  Name: Takyia Sindt MRN: [id number] Date of Birth: 1965/07/03  Serafina Royals, PT 05/18/17 5:07 PM

## 2017-05-20 ENCOUNTER — Ambulatory Visit: Payer: 59 | Admitting: Physical Therapy

## 2017-05-20 DIAGNOSIS — R600 Localized edema: Secondary | ICD-10-CM | POA: Diagnosis not present

## 2017-05-20 NOTE — Patient Instructions (Signed)
Deep Effective Breath   Standing, sitting, or laying down, place both hands on the belly. Take a deep breath IN, expanding the belly; then breath OUT, contracting the belly. Repeat __5__ times. Do __2-3__ sessions per day and before your self massage.  http://gt2.exer.us/866   Copyright  VHI. All rights reserved.  Axilla to Axilla - Sweep   On uninvolved side make 5 circles in the armpit, then pump _5__ times from involved armpit across chest to uninvolved armpit, making a pathway. Do _1__ time per day.  Copyright  VHI. All rights reserved.  Axilla to Inguinal Nodes - Pump   On involved side, make 5 circles at groin at panty line, then pump _5__ times from armpit along side of trunk to outer hip, making your other pathway. Do __1_ time per day.  Copyright  VHI. All rights reserved.  Arm Posterior: Elbow to Shoulder - Pump   Pump _5__ times from back of elbow to top of shoulder. Then inner to outer upper arm _5_ times, then outer arm again _5_ times. Then back to the pathways _2-3_ times. Do _1__ time per day.  Copyright  VHI. All rights reserved.  ARM: [place] Wrist to Elbow - Sweep   Pump or stationary circles _5__ times from wrist to elbow making sure to do both sides of the forearm. Then retrace your steps to the outer arm, and the pathways _2-3_ times each. Do _1__ time per day.  Copyright  VHI. All rights reserved.  ARM: Dorsum of Hand to Shoulder - Sweep   Pump or stationary circles _5__ times on back of hand including knuckle spaces and individual fingers if needed working up towards the wrist, then retrace all your steps working back up the forearm, doing both sides; upper outer arm and back to your pathways _2-3_ times each. Then do 5 circles again at uninvolved armpit and involved groin where you started! Good job!! Do __1_ time per day.  Copyright  VHI. All rights reserved.

## 2017-05-20 NOTE — Therapy (Addendum)
[place], [place], [address] Phone: [phone]   Fax:  [phone]  Physical Therapy Treatment  Patient Details  Name: [name] [name] MRN: [id number] Date of Birth: 1966/01/28 Referring Provider: Dr. Marla Roe   Encounter Date: 05/20/2017  PT End of Session - 05/20/17 1632    Visit Number  3    Number of Visits  9    Date for PT Re-Evaluation  06/11/17    PT Start Time  0315    PT Stop Time  1616    PT Time Calculation (min)  45 min    Activity Tolerance  Patient tolerated treatment well    Behavior During Therapy  [facility] for tasks assessed/performed       Past Medical History:  Diagnosis Date  . Breast cancer ([place]) 2016   right breast cancer, 2018- Left  . Diabetes mellitus without complication ([place])    TYpe II  . History of radiation therapy 2016  . Pneumonia 04/2016    Past Surgical History:  Procedure Laterality Date  . AXILLARY SENTINEL NODE BIOPSY Right 06/23/2014   Procedure: AXILLARY SENTINEL NODE BIOPSY;  Surgeon: Autumn Messing III, MD;  Location: [facility];  Service: General;  Laterality: Right;  . BREAST LUMPECTOMY WITH NEEDLE LOCALIZATION Right 06/05/2014   Procedure: RIGHT BREAST LUMPECTOMY WITH NEEDLE LOCALIZATION;  Surgeon: Autumn Messing III, MD;  Location: [facility];  Service: General;  Laterality: Right;  . BREAST RECONSTRUCTION WITH PLACEMENT OF TISSUE EXPANDER AND FLEX HD (ACELLULAR HYDRATED DERMIS) Bilateral 07/24/2016   Procedure: IMMEDIATE BREAST RECONSTRUCTION WITH PLACEMENT OF TISSUE EXPANDER AND FLEX HD (ACELLULAR HYDRATED DERMIS);  Surgeon: Wallace Going, DO;  Location: [facility];  Service: Plastics;  Laterality: Bilateral;  . BREAST SURGERY  03/17/2017   LATISSIMUS FLAP TO RIGHT CHEST [place] (Right Chest)  . LATISSIMUS FLAP TO BREAST Right 03/17/2017   Procedure: LATISSIMUS FLAP TO RIGHT CHEST [place];  Surgeon: Wallace Going,  DO;  Location: [facility];  Service: Plastics;  Laterality: Right;  . MASTECTOMY W/ SENTINEL NODE BIOPSY Bilateral 07/24/2016   Procedure: LEFT MASTECTOMY WITH LEFT SENTINEL LYMPH NODE BIOPSY, RIGHT PROPHYLACTIC MASTECTOMY;  Surgeon: Jovita Kussmaul, MD;  Location: [facility];  Service: General;  Laterality: Bilateral;  . PORT-A-CATH REMOVAL N/A 11/10/2016   Procedure: REMOVAL PORT-A-CATH;  Surgeon: Jovita Kussmaul, MD;  Location: [facility];  Service: General;  Laterality: N/A;  . PORTACATH PLACEMENT N/A 08/21/2016   Procedure: INSERTION PORT-A-CATH WITH [place];  Surgeon: Jovita Kussmaul, MD;  Location: [facility];  Service: General;  Laterality: N/A;  . RE-EXCISION OF BREAST LUMPECTOMY Right 06/23/2014   Procedure: RE-EXCISION OF RIGHT BREAST INFERIOR MEDIAL MARGIN AND SENTINEL LYMPH NODE BIOPSY;  Surgeon: Autumn Messing III, MD;  Location: [place];  Service: General;  Laterality: Right;    There were no vitals filed for this visit.  Subjective Assessment - 05/20/17 1535    Subjective  "Shawn, the PA at Dr. Eusebio Friendly said that this left breast is now filled, and the swelling might be because of that." Got the prescription signed by [name].    Pertinent History  diabetes, R breast cancer ER/PR positive, Her 2 negative, right breast lumpectomy with SLNB July 2016 completed radiation, July 2018 bilateral mastectomy and completed chemotherapy with expanders placed then underwent a latissiums dorsi flap on 03/17/17, pt currently takng anastrozole    Currently in Pain?  Yes  Pain Score  5     Pain Location  Arm    Pain Orientation  Left;Anterior    Pain Descriptors / Indicators  Tightness    Pain Relieving Factors  wearing the TG soft                  Outpatient Rehab from [facility]14/2017 [facility]  Lymphedema Life Impact Scale Total Score  30.88 %           [facility] Adult PT Treatment/Exercise - 05/20/17 0001      Self-Care   Other  Self-Care Comments   Pt. brought in prescription; discussed process for getting measured for sleeve and gauntlet. Faxed prescription to [facility] and had patient keep a copy for herself.      Manual Therapy   Manual Lymphatic Drainage (MLD)  Had patient perform manual lymph drainage as follows in supine:  short neck, had patient take 5 diaphragmatic breaths with hands resting on her abdomen, left groin and axillo-inguinal anastomosis, and left UE from proximal to distal, then retracing steps. Then therapist performed the same but with fewer repetitions, for reinforcing to patient how it should feel.             PT Education - 05/20/17 1631    Education provided  Yes    Education Details  self-manual lymph drainage for left UE, directing towards left inguinal nodes    Person(s) Educated  Patient    Methods  Explanation;Demonstration;Tactile cues;Verbal cues;Handout    Comprehension  Verbalized understanding;Returned demonstration          PT Long Term Goals - 05/14/17 1656      PT LONG TERM GOAL #1   Title  Pt will be independent in a home exercise program for continued strengthening and stretching    Time  4    Period  Weeks    Status  New    Target Date  06/11/17      PT LONG TERM GOAL #2   Title  Pt will report a 75% improvement in pain in left forearm due to swelling to allow improved comfort    Time  4    Period  Weeks    Status  New    Target Date  06/11/17      PT LONG TERM GOAL #3   Title  Pt will report a 75% improvement in swelling in left forearm to decrease risk of infection    Time  4    Period  Weeks    Status  New    Target Date  06/11/17      PT LONG TERM GOAL #4   Title  Pt will receive appropriate compression garments from DME provider for long term management of edema    Time  4    Period  Weeks    Status  New    Target Date  06/11/17      PT LONG TERM GOAL #5   Title  Pt will be independent in self MLD for LUE to allow pt to manage her  swelling independently    Time  4    Period  Weeks    Status  New    Target Date  06/11/17      Additional Long Term Goals   Additional Long Term Goals  Yes      PT LONG TERM GOAL #6   Title  Pt to demosntrate 145 degrees of bilateral shoulder abduction to  allow pt to reach out to sides    Baseline  R 125 L 135    Time  4    Period  Weeks    Status  New    Target Date  06/11/17      PT LONG TERM GOAL #7   Title  Pt will demonstrate 165 degrees of right shoulder flexion to allow her to reach items overhead    Baseline  158    Time  4    Period  Weeks    Status  New    Target Date  06/11/17            Plan - 05/20/17 1632    Clinical Impression Statement  Pt. learned self-manual lymph drainage today, but will benefit from reinforcing concepts.    Rehab Potential  Good    Clinical Impairments Affecting Rehab Potential  hx of radiation in 2016    PT Frequency  2x / week    PT Duration  4 weeks    PT Treatment/Interventions  ADLs/Self Care Home Management;Therapeutic activities;Therapeutic exercise;Patient/family education;Manual lymph drainage;Manual techniques;Orthotic Fit/Training;Compression bandaging;Scar mobilization;Passive range of motion;Vasopneumatic Device    PT Next Visit Plan  Review manual lymph drainage with patient, either with therapist or patient performing it. Give dowel exercises. Check goals.    Recommended Other Services  Faxed sleeve and gauntlet prescription to [facility] [employer] today; face sheet has already been faxed and an email sent on 05/18/17    Consulted and Agree with Plan of Care  Patient       Patient will benefit from skilled therapeutic intervention in order to improve the following deficits and impairments:  Increased edema, Decreased range of motion, Decreased strength, Postural dysfunction, Decreased scar mobility, Pain  Visit Diagnosis: Localized edema     Problem List Patient Active Problem List   Diagnosis Date Noted  . Acquired  absence of bilateral breasts and nipples 03/17/2017  . Port catheter in place 09/01/2016  . Malignant neoplasm of upper-inner quadrant of left breast in female, estrogen receptor positive ([place]) 07/31/2016  . Breast cancer ([place]) 07/24/2016  . Genetic testing 11/27/2014  . Family history of breast cancer in female 11/02/2014  . Family history of colon cancer in mother 11/02/2014  . Breast cancer of upper-outer quadrant of right female breast ([place]) 04/26/2014    Jatara Huettner 05/20/2017, 4:34 PM  [place], [place], [address] Phone: [phone]   Fax:  [phone]  Name: Neftali Thurow MRN: [id number] Date of Birth: 29-May-1965  Serafina Royals, PT 05/20/17 4:34 PM  PHYSICAL THERAPY DISCHARGE SUMMARY  Visits from Start of Care: 3  Current functional level related to goals / functional outcomes: Goal for self-manual lymph drainage partially met. Other goals not met, as patient did not complete her course of treatment.   Remaining deficits: Unknown; patient did not return for planned follow-up of 6 more visits after this one.   Education / Equipment: Self-manual lymph drainage; how and where to obtain a compression garment.  Plan: Patient agrees to discharge.  Patient goals were not met. Patient is being discharged due to not returning since the last visit.  ?????    Serafina Royals, PT 10/27/17 11:54 AM

## 2017-05-25 ENCOUNTER — Encounter: Payer: 59 | Admitting: Physical Therapy

## 2017-05-25 DIAGNOSIS — C50912 Malignant neoplasm of unspecified site of left female breast: Secondary | ICD-10-CM | POA: Diagnosis not present

## 2017-05-26 ENCOUNTER — Ambulatory Visit: Payer: 59

## 2017-05-27 ENCOUNTER — Encounter: Payer: 59 | Admitting: Physical Therapy

## 2017-06-01 ENCOUNTER — Encounter: Payer: 59 | Admitting: Physical Therapy

## 2017-06-03 ENCOUNTER — Encounter: Payer: 59 | Admitting: Physical Therapy

## 2017-06-08 ENCOUNTER — Ambulatory Visit: Payer: 59 | Admitting: Physical Therapy

## 2017-06-10 ENCOUNTER — Encounter: Payer: 59 | Admitting: Physical Therapy

## 2017-06-12 ENCOUNTER — Ambulatory Visit: Payer: Self-pay | Admitting: Plastic Surgery

## 2017-06-22 ENCOUNTER — Other Ambulatory Visit: Payer: Self-pay

## 2017-06-22 ENCOUNTER — Encounter (HOSPITAL_BASED_OUTPATIENT_CLINIC_OR_DEPARTMENT_OTHER): Payer: Self-pay | Admitting: *Deleted

## 2017-06-24 ENCOUNTER — Encounter (HOSPITAL_BASED_OUTPATIENT_CLINIC_OR_DEPARTMENT_OTHER)
Admission: RE | Admit: 2017-06-24 | Discharge: 2017-06-24 | Disposition: A | Discharge status: Home / Self Care | Payer: 59 | Source: Ambulatory Visit | Attending: Plastic Surgery | Admitting: Plastic Surgery

## 2017-06-24 DIAGNOSIS — N6032 Fibrosclerosis of left breast: Secondary | ICD-10-CM | POA: Diagnosis not present

## 2017-06-24 DIAGNOSIS — Z79811 Long term (current) use of aromatase inhibitors: Secondary | ICD-10-CM | POA: Diagnosis not present

## 2017-06-24 DIAGNOSIS — E669 Obesity, unspecified: Secondary | ICD-10-CM | POA: Diagnosis not present

## 2017-06-24 DIAGNOSIS — Z7984 Long term (current) use of oral hypoglycemic drugs: Secondary | ICD-10-CM | POA: Diagnosis not present

## 2017-06-24 DIAGNOSIS — Z923 Personal history of irradiation: Secondary | ICD-10-CM | POA: Diagnosis not present

## 2017-06-24 DIAGNOSIS — E119 Type 2 diabetes mellitus without complications: Secondary | ICD-10-CM | POA: Diagnosis not present

## 2017-06-24 DIAGNOSIS — Z853 Personal history of malignant neoplasm of breast: Secondary | ICD-10-CM | POA: Diagnosis not present

## 2017-06-24 DIAGNOSIS — Z421 Encounter for breast reconstruction following mastectomy: Secondary | ICD-10-CM | POA: Diagnosis present

## 2017-06-24 DIAGNOSIS — Z79899 Other long term (current) drug therapy: Secondary | ICD-10-CM | POA: Diagnosis not present

## 2017-06-24 DIAGNOSIS — Z9013 Acquired absence of bilateral breasts and nipples: Secondary | ICD-10-CM | POA: Diagnosis not present

## 2017-06-24 DIAGNOSIS — Z9221 Personal history of antineoplastic chemotherapy: Secondary | ICD-10-CM | POA: Diagnosis not present

## 2017-06-24 DIAGNOSIS — Z6839 Body mass index (BMI) 39.0-39.9, adult: Secondary | ICD-10-CM | POA: Diagnosis not present

## 2017-06-24 DIAGNOSIS — Z803 Family history of malignant neoplasm of breast: Secondary | ICD-10-CM | POA: Diagnosis not present

## 2017-06-24 LAB — BASIC METABOLIC PANEL
ANION GAP: 11 (ref 5–15)
BUN: 6 mg/dL (ref 6–20)
CHLORIDE: 103 mmol/L (ref 101–111)
CO2: 30 mmol/L (ref 22–32)
Calcium: 8.9 mg/dL (ref 8.9–10.3)
Creatinine, Ser: 0.78 mg/dL (ref 0.44–1.00)
GFR calc non Af Amer: >60 mL/min (ref >60)
GLUCOSE: 141 mg/dL — AB (ref 65–99)
Potassium: 3.8 mmol/L (ref 3.5–5.1)
Sodium: 144 mmol/L (ref 135–145)

## 2017-06-25 ENCOUNTER — Ambulatory Visit (HOSPITAL_BASED_OUTPATIENT_CLINIC_OR_DEPARTMENT_OTHER): Payer: 59 | Admitting: Certified Registered"

## 2017-06-25 ENCOUNTER — Encounter (HOSPITAL_BASED_OUTPATIENT_CLINIC_OR_DEPARTMENT_OTHER): Payer: Self-pay | Admitting: *Deleted

## 2017-06-25 ENCOUNTER — Encounter (HOSPITAL_BASED_OUTPATIENT_CLINIC_OR_DEPARTMENT_OTHER)
Admission: RE | Disposition: A | Discharge status: Home / Self Care | Payer: Self-pay | Source: Ambulatory Visit | Attending: Plastic Surgery

## 2017-06-25 ENCOUNTER — Ambulatory Visit (HOSPITAL_BASED_OUTPATIENT_CLINIC_OR_DEPARTMENT_OTHER)
Admission: RE | Admit: 2017-06-25 | Discharge: 2017-06-25 | Disposition: A | Discharge status: Home / Self Care | Payer: 59 | Source: Ambulatory Visit | Attending: Plastic Surgery | Admitting: Plastic Surgery

## 2017-06-25 ENCOUNTER — Other Ambulatory Visit: Payer: Self-pay

## 2017-06-25 DIAGNOSIS — E119 Type 2 diabetes mellitus without complications: Secondary | ICD-10-CM | POA: Insufficient documentation

## 2017-06-25 DIAGNOSIS — Z7984 Long term (current) use of oral hypoglycemic drugs: Secondary | ICD-10-CM | POA: Insufficient documentation

## 2017-06-25 DIAGNOSIS — Z9013 Acquired absence of bilateral breasts and nipples: Secondary | ICD-10-CM | POA: Insufficient documentation

## 2017-06-25 DIAGNOSIS — Z421 Encounter for breast reconstruction following mastectomy: Secondary | ICD-10-CM | POA: Insufficient documentation

## 2017-06-25 DIAGNOSIS — Z803 Family history of malignant neoplasm of breast: Secondary | ICD-10-CM | POA: Insufficient documentation

## 2017-06-25 DIAGNOSIS — Z9221 Personal history of antineoplastic chemotherapy: Secondary | ICD-10-CM | POA: Insufficient documentation

## 2017-06-25 DIAGNOSIS — E669 Obesity, unspecified: Secondary | ICD-10-CM | POA: Insufficient documentation

## 2017-06-25 DIAGNOSIS — Z79899 Other long term (current) drug therapy: Secondary | ICD-10-CM | POA: Insufficient documentation

## 2017-06-25 DIAGNOSIS — Z9012 Acquired absence of left breast and nipple: Secondary | ICD-10-CM | POA: Diagnosis not present

## 2017-06-25 DIAGNOSIS — L905 Scar conditions and fibrosis of skin: Secondary | ICD-10-CM | POA: Diagnosis not present

## 2017-06-25 DIAGNOSIS — Z853 Personal history of malignant neoplasm of breast: Secondary | ICD-10-CM | POA: Diagnosis not present

## 2017-06-25 DIAGNOSIS — Z923 Personal history of irradiation: Secondary | ICD-10-CM | POA: Insufficient documentation

## 2017-06-25 DIAGNOSIS — Z79811 Long term (current) use of aromatase inhibitors: Secondary | ICD-10-CM | POA: Insufficient documentation

## 2017-06-25 DIAGNOSIS — Z6839 Body mass index (BMI) 39.0-39.9, adult: Secondary | ICD-10-CM | POA: Insufficient documentation

## 2017-06-25 DIAGNOSIS — N6032 Fibrosclerosis of left breast: Secondary | ICD-10-CM | POA: Insufficient documentation

## 2017-06-25 HISTORY — PX: REMOVAL OF BILATERAL TISSUE EXPANDERS WITH PLACEMENT OF BILATERAL BREAST IMPLANTS: SHX6431

## 2017-06-25 LAB — GLUCOSE, CAPILLARY: Glucose-Capillary: 94 mg/dL (ref 65–99)

## 2017-06-25 SURGERY — REMOVAL, TISSUE EXPANDER, BREAST, BILATERAL, WITH BILATERAL IMPLANT IMPLANT INSERTION
Anesthesia: General | Site: Breast | Laterality: Bilateral

## 2017-06-25 MED ORDER — MIDAZOLAM HCL 5 MG/5ML IJ SOLN
INTRAMUSCULAR | Status: DC | PRN
  Administered 2017-06-25: 2 mg via INTRAVENOUS

## 2017-06-25 MED ORDER — MIDAZOLAM HCL 2 MG/2ML IJ SOLN
INTRAMUSCULAR | Status: AC
  Filled 2017-06-25: qty 2

## 2017-06-25 MED ORDER — BUPIVACAINE-EPINEPHRINE 0.25% -1:200000 IJ SOLN
INTRAMUSCULAR | Status: DC | PRN
  Administered 2017-06-25: 5 mL

## 2017-06-25 MED ORDER — SODIUM CHLORIDE 0.9% FLUSH: 3.0000 mL | INTRAVENOUS | Status: DC | PRN

## 2017-06-25 MED ORDER — FENTANYL CITRATE (PF) 100 MCG/2ML IJ SOLN
INTRAMUSCULAR | Status: AC
  Filled 2017-06-25: qty 2

## 2017-06-25 MED ORDER — PROPOFOL 10 MG/ML IV BOLUS
INTRAVENOUS | Status: DC | PRN
  Administered 2017-06-25: 200 mg via INTRAVENOUS

## 2017-06-25 MED ORDER — ACETAMINOPHEN 650 MG RE SUPP
650.0000 mg | RECTAL | Status: DC | PRN
Start: 2017-06-25 — End: 2017-06-25

## 2017-06-25 MED ORDER — FENTANYL CITRATE (PF) 100 MCG/2ML IJ SOLN
INTRAMUSCULAR | Status: AC
Start: 2017-06-25 — End: ?
  Filled 2017-06-25: qty 2

## 2017-06-25 MED ORDER — LIDOCAINE HCL (CARDIAC) PF 100 MG/5ML IV SOSY
PREFILLED_SYRINGE | INTRAVENOUS | Status: DC | PRN
  Administered 2017-06-25: 80 mg via INTRAVENOUS

## 2017-06-25 MED ORDER — OXYCODONE HCL 5 MG PO TABS: 5.0000 mg | ORAL_TABLET | ORAL | Status: DC | PRN

## 2017-06-25 MED ORDER — CIPROFLOXACIN IN D5W 400 MG/200ML IV SOLN
INTRAVENOUS | Status: AC
  Filled 2017-06-25: qty 200

## 2017-06-25 MED ORDER — BUPIVACAINE-EPINEPHRINE (PF) 0.25% -1:200000 IJ SOLN
INTRAMUSCULAR | Status: AC
  Filled 2017-06-25: qty 30

## 2017-06-25 MED ORDER — SCOPOLAMINE 1 MG/3DAYS TD PT72: 1.0000 | MEDICATED_PATCH | Freq: Once | TRANSDERMAL | Status: DC

## 2017-06-25 MED ORDER — CIPROFLOXACIN IN D5W 400 MG/200ML IV SOLN: 400.0000 mg | INTRAVENOUS | Status: DC

## 2017-06-25 MED ORDER — FENTANYL CITRATE (PF) 100 MCG/2ML IJ SOLN: 50.0000 ug | INTRAMUSCULAR | Status: DC | PRN

## 2017-06-25 MED ORDER — FENTANYL CITRATE (PF) 100 MCG/2ML IJ SOLN
25.0000 ug | INTRAMUSCULAR | Status: DC | PRN
  Administered 2017-06-25: 50 ug via INTRAVENOUS

## 2017-06-25 MED ORDER — DEXAMETHASONE SODIUM PHOSPHATE 4 MG/ML IJ SOLN
INTRAMUSCULAR | Status: DC | PRN
  Administered 2017-06-25: 10 mg via INTRAVENOUS

## 2017-06-25 MED ORDER — MIDAZOLAM HCL 2 MG/2ML IJ SOLN: 1.0000 mg | INTRAMUSCULAR | Status: DC | PRN

## 2017-06-25 MED ORDER — LACTATED RINGERS IV SOLN
INTRAVENOUS | Status: DC
  Administered 2017-06-25 (×2): via INTRAVENOUS

## 2017-06-25 MED ORDER — SODIUM CHLORIDE 0.9 % IV SOLN
INTRAVENOUS | Status: DC | PRN
  Administered 2017-06-25: 500 mL

## 2017-06-25 MED ORDER — SODIUM CHLORIDE 0.9% FLUSH: 3.0000 mL | Freq: Two times a day (BID) | INTRAVENOUS | Status: DC

## 2017-06-25 MED ORDER — FENTANYL CITRATE (PF) 100 MCG/2ML IJ SOLN
INTRAMUSCULAR | Status: DC | PRN
  Administered 2017-06-25: 100 ug via INTRAVENOUS
  Administered 2017-06-25 (×4): 50 ug via INTRAVENOUS

## 2017-06-25 MED ORDER — PROMETHAZINE HCL 25 MG/ML IJ SOLN: 6.2500 mg | INTRAMUSCULAR | Status: DC | PRN

## 2017-06-25 MED ORDER — CIPROFLOXACIN IN D5W 400 MG/200ML IV SOLN
INTRAVENOUS | Status: DC | PRN
  Administered 2017-06-25: 400 mg via INTRAVENOUS

## 2017-06-25 MED ORDER — ONDANSETRON HCL 4 MG/2ML IJ SOLN
INTRAMUSCULAR | Status: DC | PRN
  Administered 2017-06-25: 4 mg via INTRAVENOUS

## 2017-06-25 MED ORDER — SODIUM CHLORIDE 0.9 % IV SOLN
250.0000 mL | INTRAVENOUS | Status: DC | PRN
Start: 2017-06-25 — End: 2017-06-25

## 2017-06-25 MED ORDER — SCOPOLAMINE 1 MG/3DAYS TD PT72: 1.0000 | MEDICATED_PATCH | Freq: Once | TRANSDERMAL | Status: DC | PRN

## 2017-06-25 MED ORDER — ACETAMINOPHEN 325 MG PO TABS: 650.0000 mg | ORAL_TABLET | ORAL | Status: DC | PRN

## 2017-06-25 SURGICAL SUPPLY — 46 items
ADH SKN CLS APL DERMABOND .7 (GAUZE/BANDAGES/DRESSINGS) ×1
BAG DECANTER FOR FLEXI CONT (MISCELLANEOUS) ×3 IMPLANT
BINDER BREAST XXLRG (GAUZE/BANDAGES/DRESSINGS) ×2 IMPLANT
BLADE HEX COATED 2.75 (ELECTRODE) ×3 IMPLANT
BLADE SURG 15 STRL LF DISP TIS (BLADE) ×2 IMPLANT
BLADE SURG 15 STRL SS (BLADE) ×3
CANISTER SUCT 1200ML W/VALVE (MISCELLANEOUS) ×3 IMPLANT
CHLORAPREP W/TINT 26ML (MISCELLANEOUS) ×3 IMPLANT
COVER BACK TABLE 60X90IN (DRAPES) ×3 IMPLANT
COVER MAYO STAND STRL (DRAPES) ×3 IMPLANT
DERMABOND ADVANCED (GAUZE/BANDAGES/DRESSINGS) ×2
DERMABOND ADVANCED .7 DNX12 (GAUZE/BANDAGES/DRESSINGS) IMPLANT
DRAPE LAPAROSCOPIC ABDOMINAL (DRAPES) ×3 IMPLANT
DRSG PAD ABDOMINAL 8X10 ST (GAUZE/BANDAGES/DRESSINGS) ×4 IMPLANT
ELECT BLADE 4.0 EZ CLEAN MEGAD (MISCELLANEOUS) ×3
ELECT REM PT RETURN 9FT ADLT (ELECTROSURGICAL) ×3
ELECTRODE BLDE 4.0 EZ CLN MEGD (MISCELLANEOUS) ×1 IMPLANT
ELECTRODE REM PT RTRN 9FT ADLT (ELECTROSURGICAL) ×1 IMPLANT
GLOVE BIO SURGEON STRL SZ 6.5 (GLOVE) ×4 IMPLANT
GLOVE BIO SURGEONS STRL SZ 6.5 (GLOVE) ×2
GOWN STRL REUS W/ TWL LRG LVL3 (GOWN DISPOSABLE) ×2 IMPLANT
GOWN STRL REUS W/TWL LRG LVL3 (GOWN DISPOSABLE) ×6
IMPL BREAST P6.5XULT HI 650 (Breast) IMPLANT
IMPL BRST P6.5XULT HI 650CC (Breast) ×1 IMPLANT
IMPLANT BREAST GEL 650CC (Breast) ×3 IMPLANT
NDL HYPO 25X1 1.5 SAFETY (NEEDLE) IMPLANT
NDL SAFETY ECLIPSE 18X1.5 (NEEDLE) ×1 IMPLANT
NEEDLE HYPO 18GX1.5 SHARP (NEEDLE) ×3
NEEDLE HYPO 25X1 1.5 SAFETY (NEEDLE) ×3 IMPLANT
PACK BASIN DAY SURGERY FS (CUSTOM PROCEDURE TRAY) ×3 IMPLANT
PENCIL BUTTON HOLSTER BLD 10FT (ELECTRODE) ×3 IMPLANT
SIZER BREAST REUSE 650CC (SIZER) ×3
SIZER BRST REUSE P6.4 650CC (SIZER) IMPLANT
SLEEVE SCD COMPRESS KNEE MED (MISCELLANEOUS) ×3 IMPLANT
SPONGE LAP 18X18 RF (DISPOSABLE) ×6 IMPLANT
SUT MNCRL AB 4-0 PS2 18 (SUTURE) ×7 IMPLANT
SUT MON AB 3-0 SH 27 (SUTURE) ×9
SUT MON AB 3-0 SH27 (SUTURE) ×1 IMPLANT
SUT MON AB 5-0 PS2 18 (SUTURE) ×7 IMPLANT
SYR BULB IRRIGATION 50ML (SYRINGE) ×3 IMPLANT
SYR CONTROL 10ML LL (SYRINGE) ×2 IMPLANT
TOWEL OR 17X24 6PK STRL BLUE (TOWEL DISPOSABLE) ×6 IMPLANT
TUBE CONNECTING 20'X1/4 (TUBING) ×1
TUBE CONNECTING 20X1/4 (TUBING) ×2 IMPLANT
UNDERPAD 30X30 (UNDERPADS AND DIAPERS) ×6 IMPLANT
YANKAUER SUCT BULB TIP NO VENT (SUCTIONS) ×3 IMPLANT

## 2017-06-25 NOTE — Anesthesia Procedure Notes (Signed)
Procedure Name: LMA Insertion Date/Time: 06/25/2017 12:37 PM Performed by: Maryella Shivers, CRNA Pre-anesthesia Checklist: Patient identified, Emergency Drugs available, Suction available and Patient being monitored Patient Re-evaluated:Patient Re-evaluated prior to induction Oxygen Delivery Method: Circle system utilized Preoxygenation: Pre-oxygenation with 100% oxygen Induction Type: IV induction Ventilation: Mask ventilation without difficulty LMA: LMA inserted LMA Size: 4.0 Number of attempts: 1 Airway Equipment and Method: Bite block Placement Confirmation: positive ETCO2 Tube secured with: Tape Dental Injury: Teeth and Oropharynx as per pre-operative assessment

## 2017-06-25 NOTE — Anesthesia Preprocedure Evaluation (Addendum)
Anesthesia Evaluation  Patient identified by MRN, date of birth, ID band Patient awake    Reviewed: Allergy & Precautions, NPO status , Patient's Chart, lab work & pertinent test results  Airway Mallampati: II  TM Distance: >3 FB Neck ROM: Full    Dental  (+) Teeth Intact, Dental Advisory Given   Pulmonary neg pulmonary ROS,    Pulmonary exam normal breath sounds clear to auscultation       Cardiovascular negative cardio ROS Normal cardiovascular exam Rhythm:Regular Rate:Normal     Neuro/Psych negative neurological ROS  negative psych ROS   GI/Hepatic negative GI ROS, Neg liver ROS,   Endo/Other  diabetes, Type 2, Oral Hypoglycemic AgentsObesity   Renal/GU negative Renal ROS     Musculoskeletal negative musculoskeletal ROS (+)   Abdominal   Peds  Hematology negative hematology ROS (+)   Anesthesia Other Findings Day of surgery medications reviewed with the patient.  History of right breast cancer, Acquired absence of bilateral breasts and nipples, Malignant neoplasm of upper-outer quadrant of left breast in female, estrogen receptor positive  Reproductive/Obstetrics                            Anesthesia Physical Anesthesia Plan  ASA: II  Anesthesia Plan: General   Post-op Pain Management:    Induction: Intravenous  PONV Risk Score and Plan: 3 and Midazolam, Dexamethasone and Ondansetron  Airway Management Planned: Oral ETT  Additional Equipment:   Intra-op Plan:   Post-operative Plan: Extubation in OR  Informed Consent: I have reviewed the patients History and Physical, chart, labs and discussed the procedure including the risks, benefits and alternatives for the proposed anesthesia with the patient or authorized representative who has indicated his/her understanding and acceptance.   Dental advisory given  Plan Discussed with: CRNA  Anesthesia Plan Comments:          Anesthesia Quick Evaluation

## 2017-06-25 NOTE — Transfer of Care (Signed)
Immediate Anesthesia Transfer of Care Note  Patient: [name] [name]  Procedure(s) Performed: REMOVAL OF BILATERAL TISSUE EXPANDERS WITH PLACEMENT OF BILATERAL SILICONE BREAST IMPLANTS (Bilateral Breast)  Patient Location: PACU  Anesthesia Type:General  Level of Consciousness: sedated  Airway & Oxygen Therapy: Patient Spontanous Breathing and Patient connected to face mask oxygen  Post-op Assessment: Report given to RN and Post -op Vital signs reviewed and stable  Post vital signs: Reviewed and stable  Last Vitals:  Vitals Value Taken Time  BP 164/105 06/25/2017  1:45 PM  Temp    Pulse 98 06/25/2017  1:46 PM  Resp 17 06/25/2017  1:46 PM  SpO2 100 % 06/25/2017  1:46 PM  Vitals shown include unvalidated device data.  Last Pain:  Vitals:   06/25/17 1117  TempSrc:   PainSc: 0-No pain      Patients Stated Pain Goal: 0 (70/96/28 3662)  Complications: No apparent anesthesia complications

## 2017-06-25 NOTE — Anesthesia Postprocedure Evaluation (Signed)
Anesthesia Post Note  Patient: [name] [name]  Procedure(s) Performed: REMOVAL OF BILATERAL TISSUE EXPANDERS WITH PLACEMENT OF BILATERAL SILICONE BREAST IMPLANTS (Bilateral Breast)     Patient location during evaluation: PACU Anesthesia Type: General Level of consciousness: awake Pain management: pain level controlled Vital Signs Assessment: post-procedure vital signs reviewed and stable Respiratory status: spontaneous breathing Postop Assessment: no apparent nausea or vomiting Anesthetic complications: no    Last Vitals:  Vitals:   06/25/17 1415 06/25/17 1430  BP: (!) 188/104 138/85  Pulse: 87   Resp: 16   Temp:    SpO2: 97% 100%    Last Pain:  Vitals:   06/25/17 1430  TempSrc:   PainSc: 4    Pain Goal: Patients Stated Pain Goal: 0 (06/25/17 1117)               [place]

## 2017-06-25 NOTE — Op Note (Signed)
First Assist Op Note: [facility]  I assisted the Surgeon(s) _____Dr. Lyndee Leo Dillingham___ on the procedure(s): ___Removal of left breast tissue expander and placement of left breast silicone implant_______on Date ___5/16/19______  I provided my assistance on this case as follows:  I was present and acted as first Environmental consultant during this operation. I was present during the patient transport into the operative suite and assisted the OR staff with transferring and positioning of the patient. All extremities were checked and properly cushioned and safety straps in place. I was involved in the prepping and placement of sterile drapes. A time out was performed and all information confirmed to be correct.  I first assisted during the case including retraction for exposure, assisting with closure of surgical wounds and application of sterile dressings. I provided assistance with application of post operative garments/splinting and assisted with patient transfer back to the stretcher as needed.   Silvie Obremski,PA-C Plastic Surgery [phone]

## 2017-06-25 NOTE — Op Note (Signed)
Op report Unilateral Breast Exchange   DATE OF OPERATION:  06/25/2017  LOCATION: [place]  SURGICAL DIVISION: Plastic Surgery  PREOPERATIVE DIAGNOSES:  1. History of breast cancer.  2. Acquired absence of left breast.   POSTOPERATIVE DIAGNOSES:  1. History of breast cancer.  2. Acquired absence of left breast.   PROCEDURE:  1. Exchange of left tissue expander for implant. 2. Capsulotomies for implant respositioning.  SURGEON: Shilynn Hoch Sanger Delila Kuklinski, DO  ASSISTANT: [name], PA  ANESTHESIA:  General.   COMPLICATIONS: None.   IMPLANTS: [place] Smooth Round Ultra High Profile Gel 650cc. Ref #[id number].  Serial Number [id number]  INDICATIONS FOR PROCEDURE:  The patient, [name] [name], is a 52 y.o. female born on 03-24-1965, is here for treatment for further treatment after a mastectomy and placement of bilateral tissue expanders.  She now presents for exchange of her expanders for an implants.  She requires capsulotomies to better position the implant.  She was noted to have redness with mild inflammation on the right breast.  To be cautious we made the decision to wait on the right side. MRN: [id number]  CONSENT:  Informed consent was obtained directly from the patient. Risks, benefits and alternatives were fully discussed. Specific risks including but not limited to bleeding, infection, hematoma, seroma, scarring, pain, implant infection, implant extrusion, capsular contracture, asymmetry, wound healing problems, and need for further surgery were all discussed. The patient did have an ample opportunity to have her questions answered to her satisfaction.   DESCRIPTION OF PROCEDURE:  The patient was taken to the operating room. SCDs were placed and IV antibiotics were given. The patient's chest was prepped and draped in a sterile fashion. A time out was performed and the implants to be used were identified.  One percent Xylocaine with epinephrine was  used to infiltrate the area.   LEFT: The old mastectomy scar was opened and superior mastectomy and inferior mastectomy flaps were re-raised over the pectoralis major muscle. The pectoralis was split to expose the tissue expander which was removed. Inspection of the pocket showed a normal healthy capsule and good integration of the biologic matrix.   Circumferential capsulotomies were performed to allow for breast pocket expansion.  Measurements were made to confirm adequate pocket size for the implant dimensions.  Hemostasis was ensured with electrocautery.  The pocket was irrigated with antibiotic solution.  New gloves were placed.  The implant was placed in the pocket and oriented appropriately. The pectoralis major muscle and capsule on the anterior surface were re-closed with a 3-0 running Monocryl suture. The remaining skin was closed with 4-0 Monocryl deep dermal and 5-0 Monocryl subcuticular stitches.  Dermabond was applied.  A breast binder and ABD was applied.  The patient was allowed to wake from anesthesia and taken to the recovery room in satisfactory condition.

## 2017-06-25 NOTE — Discharge Instructions (Signed)
May shower tomorrow or Saturday. No heavy lifting. Follow up in one week. Up walking and moving legs. Continue breast binder or sports bra.    Post Anesthesia Home Care Instructions  Activity: Get plenty of rest for the remainder of the day. A responsible individual must stay with you for 24 hours following the procedure.  For the next 24 hours, DO NOT: -Drive a car -Paediatric nurse -Drink alcoholic beverages -Take any medication unless instructed by your physician -Make any legal decisions or sign important papers.  Meals: Start with liquid foods such as gelatin or soup. Progress to regular foods as tolerated. Avoid greasy, spicy, heavy foods. If nausea and/or vomiting occur, drink only clear liquids until the nausea and/or vomiting subsides. Call your physician if vomiting continues.  Special Instructions/Symptoms: Your throat may feel dry or sore from the anesthesia or the breathing tube placed in your throat during surgery. If this causes discomfort, gargle with warm salt water. The discomfort should disappear within 24 hours.  If you had a scopolamine patch placed behind your ear for the management of post- operative nausea and/or vomiting:  1. The medication in the patch is effective for 72 hours, after which it should be removed.  Wrap patch in a tissue and discard in the trash. Wash hands thoroughly with soap and water. 2. You may remove the patch earlier than 72 hours if you experience unpleasant side effects which may include dry mouth, dizziness or visual disturbances. 3. Avoid touching the patch. Wash your hands with soap and water after contact with the patch.

## 2017-06-25 NOTE — H&P (Signed)
[name] [name] is an 52 y.o. female.   Chief Complaint: acquired absence of breasts HPI: [name] [name] a 52 yo female here for pre operative history and physical prior to exchange surgery with removal of bilateral tissue expanders and placement of bilateral silicone implants . She underwent right breast reconstruction with latissimus dorsi flap and tissue expander placement on 03/17/17. EXPANDER was 535 cc . She developed a leak in the left breast expander several weeks ago and has required weekly fills to keep expanded. She had undergone bilateral mastectomies with immediate reconstruction with TE/ADM but did not expand well on the right side due to her previous radiation therapy and required revision to a latissimus flap in order to expand adequately. She completed chemotherapy (Taxotere and Cytoxan) in Sept. 2018. She is on antiestrogen therapy now, anastrozole.   History: RIGHT upper-outer quadrant breast cancer, ER/PR positive, Her 2 negative. She underwent right breast lumpectomy with radiation (08/2014) and tamoxifen therapy.Mammogram was negative but MRI showed a 6.9 cm area of enhancement. Biopsy = invasive lobular cancer and on 05/20/16 the LEFT breast invasive lobular cancer with LCIS, Ki-67 20%, HER-2 negative. The Tamoxifen was stopped 4/26 and switched to anastrozole daily.   Past Medical History:  Diagnosis Date  . Breast cancer ([place]) 2016   right breast cancer, 2018- Left  . Diabetes mellitus without complication ([place])    TYpe II  . History of radiation therapy 2016  . Pneumonia 04/2016    Past Surgical History:  Procedure Laterality Date  . AXILLARY SENTINEL NODE BIOPSY Right 06/23/2014   Procedure: AXILLARY SENTINEL NODE BIOPSY;  Surgeon: Autumn Messing III, MD;  Location: [place];  Service: General;  Laterality: Right;  . BREAST LUMPECTOMY WITH NEEDLE LOCALIZATION Right 06/05/2014   Procedure: RIGHT BREAST LUMPECTOMY WITH NEEDLE LOCALIZATION;  Surgeon: Autumn Messing III,  MD;  Location: [place];  Service: General;  Laterality: Right;  . BREAST RECONSTRUCTION WITH PLACEMENT OF TISSUE EXPANDER AND FLEX HD (ACELLULAR HYDRATED DERMIS) Bilateral 07/24/2016   Procedure: IMMEDIATE BREAST RECONSTRUCTION WITH PLACEMENT OF TISSUE EXPANDER AND FLEX HD (ACELLULAR HYDRATED DERMIS);  Surgeon: Wallace Going, DO;  Location: [place];  Service: Plastics;  Laterality: Bilateral;  . BREAST SURGERY  03/17/2017   LATISSIMUS FLAP TO RIGHT CHEST [place] (Right Chest)  . LATISSIMUS FLAP TO BREAST Right 03/17/2017   Procedure: LATISSIMUS FLAP TO RIGHT CHEST [place];  Surgeon: Wallace Going, DO;  Location: [place];  Service: Plastics;  Laterality: Right;  . MASTECTOMY W/ SENTINEL NODE BIOPSY Bilateral 07/24/2016   Procedure: LEFT MASTECTOMY WITH LEFT SENTINEL LYMPH NODE BIOPSY, RIGHT PROPHYLACTIC MASTECTOMY;  Surgeon: Jovita Kussmaul, MD;  Location: [place];  Service: General;  Laterality: Bilateral;  . PORT-A-CATH REMOVAL N/A 11/10/2016   Procedure: REMOVAL PORT-A-CATH;  Surgeon: Jovita Kussmaul, MD;  Location: [place];  Service: General;  Laterality: N/A;  . PORTACATH PLACEMENT N/A 08/21/2016   Procedure: INSERTION PORT-A-CATH WITH [place];  Surgeon: Jovita Kussmaul, MD;  Location: [place];  Service: General;  Laterality: N/A;  . RE-EXCISION OF BREAST LUMPECTOMY Right 06/23/2014   Procedure: RE-EXCISION OF RIGHT BREAST INFERIOR MEDIAL MARGIN AND SENTINEL LYMPH NODE BIOPSY;  Surgeon: Autumn Messing III, MD;  Location: [place];  Service: General;  Laterality: Right;    Family History  Problem Relation Age of Onset  . Colon cancer Mother        dx. late 71s  . Congestive Heart Failure Father  smoker  . Cancer Maternal Aunt        unspecified type; dx. 36s  . Heart attack Maternal Uncle   . Breast cancer Paternal Aunt        dx. 31s  . Heart attack Paternal Uncle   . Heart attack Paternal Grandmother   . Heart attack  Paternal Grandfather   . Congestive Heart Failure Paternal Aunt   . Cancer Cousin        unspecified type/unspecified type  . Breast cancer Cousin 52   Social History:  reports that she has never smoked. She has never used smokeless tobacco. She reports that she drinks alcohol. She reports that she does not use drugs.  Allergies:  Allergies  Allergen Reactions  . Penicillins Hives, Swelling and Other (See Comments)     PATIENT HAS HAD A PCN REACTION WITH IMMEDIATE RASH, FACIAL/TONGUE/THROAT SWELLING, SOB, OR LIGHTHEADEDNESS WITH HYPOTENSION:  #  #  #  YES  #  #  #   Has patient had a PCN reaction causing severe rash involving mucus membranes or skin necrosis: No Has patient had a PCN reaction that required hospitalization: No Has patient had a PCN reaction occurring within the last 10 years: No If all of the above answers are "NO", then may proceed with Cephalosporin use.   . Shellfish Allergy Hives and Swelling  . Tetracyclines & Related Hives    Medications Prior to Admission  Medication Sig Dispense Refill  . anastrozole (ARIMIDEX) 1 MG tablet Take 1 mg by mouth daily.    Marland Kitchen atorvastatin (LIPITOR) 10 MG tablet Take 1 tablet (10 mg total) by mouth daily.    Marland Kitchen glimepiride (AMARYL) 4 MG tablet Take 1 tablet (4 mg total) by mouth daily with breakfast.    . metFORMIN (GLUCOPHAGE) 1000 MG tablet Take 1,000 mg by mouth 2 (two) times daily with a meal.    . diazepam (VALIUM) 2 MG tablet Take 2 mg by mouth every 8 (eight) hours as needed for anxiety.  0  . methocarbamol (ROBAXIN) 500 MG tablet Take 1 tablet (500 mg total) by mouth every 6 (six) hours as needed for muscle spasms. 30 tablet 1  . ondansetron (ZOFRAN) 4 MG tablet Take 4 mg by mouth every 8 (eight) hours as needed.  0  . polyethylene glycol (MIRALAX / GLYCOLAX) packet Take 17 g by mouth daily as needed for mild constipation. 14 each 0  . senna (SENOKOT) 8.6 MG TABS tablet Take 1 tablet (8.6 mg total) by mouth 2 (two) times  daily. 120 each 0    Results for orders placed or performed during the hospital encounter of 06/25/17 (from the past 48 hour(s))  Basic metabolic panel     Status: Abnormal   Collection Time: 06/24/17  5:07 PM  Result Value Ref Range   Sodium 144 135 - 145 mmol/L   Potassium 3.8 3.5 - 5.1 mmol/L   Chloride 103 101 - 111 mmol/L   CO2 30 22 - 32 mmol/L   Glucose, Bld 141 (H) 65 - 99 mg/dL   BUN 6 6 - 20 mg/dL   Creatinine, Ser 0.78 0.44 - 1.00 mg/dL   Calcium 8.9 8.9 - 10.3 mg/dL   GFR calc non Af Amer >60 >60 mL/min   GFR calc Af Amer >60 >60 mL/min    Comment: (NOTE) The eGFR has been calculated using the CKD EPI equation. This calculation has not been validated in all clinical situations. eGFR's persistently <60 mL/min signify possible  Chronic Kidney Disease.    Anion gap 11 5 - 15    Comment: Performed at [facility] [address], [place], [place] [address]  Glucose, capillary     Status: None   Collection Time: 06/25/17 11:26 AM  Result Value Ref Range   Glucose-Capillary 94 65 - 99 mg/dL   No results found.  Review of Systems  Constitutional: Negative.   HENT: Negative.   Eyes: Negative.   Respiratory: Negative.   Cardiovascular: Negative.   Gastrointestinal: Negative.   Genitourinary: Negative.   Musculoskeletal: Negative.   Skin: Negative.   Neurological: Negative.   Psychiatric/Behavioral: Negative.     Blood pressure 138/83, pulse 96, temperature 98.5 F (36.9 C), temperature source Oral, resp. rate 18, height '5\' 7"'  (1.702 m), weight 112.2 kg (247 lb 6.4 oz), last menstrual period 10/06/2014, SpO2 100 %. Physical Exam  Constitutional: She is oriented to person, place, and time. She appears well-developed and well-nourished.  HENT:  Head: Normocephalic and atraumatic.  Eyes: Pupils are equal, round, and reactive to light. EOM are normal.  Cardiovascular: Normal rate.  Respiratory: Effort normal.  GI: Soft.  Neurological: She is alert and  oriented to person, place, and time.  Skin: Skin is warm. No erythema.  Psychiatric: She has a normal mood and affect. Her behavior is normal. Judgment and thought content normal.     Assessment/Plan    The left expander was filled with 350 cc of sterile injectable saline under sterile techniques and the patient tolerated the procedure well.  Prior to deflation the left expander had 630/300cc The right expander has 500/535 cc . The right back fluid collection was aspirated under sterile techniques for a total of 200cc of clear serous fluid and the patient tolerated the procedure well.  The patient is ready to proceed with exchange surgery with removal of bilateral tissue expanders and placement of bilateral silicone implants.  The risks that can be encountered with and after placement of a breast expander placement were discussed and include the following but not limited to these: bleeding, infection, delayed healing, anesthesia risks, skin sensation changes, injury to structures including nerves, blood vessels, and muscles which may be temporary or permanent, allergies to tape, suture materials and glues, blood products, topical preparations or injected agents, skin contour irregularities, skin discoloration and swelling, deep vein thrombosis, cardiac and pulmonary complications, pain, which may persist, fluid accumulation, wrinkling of the skin over the implant, changes in nipple or breast sensation, implant leakage or rupture, faulty position of the implant, persistent pain, formation of tight scar tissue around the implant (capsular contracture), possible need for revisional surgery or staged procedures. The patient's questions were answered and she desires to proceed and consent was obtained.  Prescriptions for Norco, Valium, Cipro and Phenergan were all printed per patient request as she is changing pharmacies and does not know which pharmacy she will be using  Wallace Going,  DO 06/25/2017, 12:20 PM

## 2017-06-26 ENCOUNTER — Encounter (HOSPITAL_BASED_OUTPATIENT_CLINIC_OR_DEPARTMENT_OTHER): Payer: Self-pay | Admitting: Plastic Surgery

## 2017-06-27 ENCOUNTER — Other Ambulatory Visit: Payer: Self-pay | Admitting: Hematology and Oncology

## 2017-06-29 DIAGNOSIS — T50905A Adverse effect of unspecified drugs, medicaments and biological substances, initial encounter: Secondary | ICD-10-CM | POA: Diagnosis not present

## 2017-06-29 DIAGNOSIS — L650 Telogen effluvium: Secondary | ICD-10-CM | POA: Diagnosis not present

## 2017-07-31 ENCOUNTER — Telehealth: Payer: Self-pay | Admitting: Adult Health

## 2017-07-31 ENCOUNTER — Encounter: Payer: Self-pay | Admitting: Adult Health

## 2017-07-31 ENCOUNTER — Inpatient Hospital Stay: Payer: 59 | Attending: Hematology and Oncology | Admitting: Adult Health

## 2017-07-31 VITALS — BP 122/70 | HR 99 | Temp 98.0°F | Resp 20 | Ht 67.0 in | Wt 249.4 lb

## 2017-07-31 DIAGNOSIS — Z170 Estrogen receptor positive status [ER+]: Secondary | ICD-10-CM | POA: Diagnosis not present

## 2017-07-31 DIAGNOSIS — C50411 Malignant neoplasm of upper-outer quadrant of right female breast: Secondary | ICD-10-CM

## 2017-07-31 DIAGNOSIS — Z853 Personal history of malignant neoplasm of breast: Secondary | ICD-10-CM | POA: Diagnosis not present

## 2017-07-31 DIAGNOSIS — Z923 Personal history of irradiation: Secondary | ICD-10-CM | POA: Diagnosis not present

## 2017-07-31 DIAGNOSIS — Z9013 Acquired absence of bilateral breasts and nipples: Secondary | ICD-10-CM

## 2017-07-31 DIAGNOSIS — Z800 Family history of malignant neoplasm of digestive organs: Secondary | ICD-10-CM | POA: Diagnosis not present

## 2017-07-31 DIAGNOSIS — Z79811 Long term (current) use of aromatase inhibitors: Secondary | ICD-10-CM

## 2017-07-31 DIAGNOSIS — Z803 Family history of malignant neoplasm of breast: Secondary | ICD-10-CM | POA: Diagnosis not present

## 2017-07-31 DIAGNOSIS — C50212 Malignant neoplasm of upper-inner quadrant of left female breast: Secondary | ICD-10-CM

## 2017-07-31 DIAGNOSIS — Z9221 Personal history of antineoplastic chemotherapy: Secondary | ICD-10-CM

## 2017-07-31 DIAGNOSIS — E2839 Other primary ovarian failure: Secondary | ICD-10-CM

## 2017-07-31 MED ORDER — ANASTROZOLE 1 MG PO TABS
1.0000 mg | ORAL_TABLET | Freq: Every day | ORAL | 4 refills | Status: DC
Start: 2017-07-31 — End: 2018-02-17

## 2017-07-31 NOTE — Telephone Encounter (Signed)
Gave patient avs and calendar of upcoming December appointments.  °

## 2017-07-31 NOTE — Assessment & Plan Note (Signed)
Bilateral mastectomies 07/24/2016  Left mastectomy: ILC grade 2, 1.4 cm, margins negative, 0/2 lymph nodes negative, T1 CN 0 stage IA, ER 95%, PR 0%, HER-2 negative, Ki-67 20% Right mastectomy: Benign  Oncotype DX score 26:17% risk of distant recurrence  Recommendation: 1.Adjuvant chemotherapy with Taxotere and Cytoxan every 3 weeks 4 cycles started 08/25/2016-10/27/2016 2. followed by anastrozole 1 mg daily with ovarian suppression (labs on 08/04/2016 showed FSH21.7 and estradiol less than 2.5)  -------------------------------------------------------------------------- Current treatment: Anastrozole 1 mg daily  Anastrozole toxicities:  [name] [name] is doing well today.  She is tolerating anastrozole well and will continue this.  I sent in refills today.  She has no clinical sign of recurrence.  We reviewed health maintenance, healthy diet, exercise regularly for wellness.  I ordered a bone density test and gave her a handout on bone health and encouraged continued calcium, vitamin d and weight bearing exercises.    Since Dr. Lindi Adie not in office today, patient would like 6 month f/u to see him.  Return to clinic in 6 months for follow-up

## 2017-07-31 NOTE — Progress Notes (Signed)
**Note De-Identified [name] Obfuscation** [name] Cancer Follow up:    [name], [name] Bihari, MD [place] [address]   DIAGNOSIS: Cancer Staging Breast cancer of upper-outer quadrant of right female breast [facility]) Staging form: Breast, AJCC 7th Edition - Clinical stage from 04/26/2014: Stage 0 (Tis (DCIS), N0, M0) - Unsigned Staging comments: Staged at breast conference 3.16.16   SUMMARY OF ONCOLOGIC HISTORY:   Breast cancer of upper-outer quadrant of right female breast ([place])   04/07/2014 Mammogram    Right breast: calcifications       04/14/2014 Initial Biopsy    Right breast needle biopsy: Mammary carcinoma in situ with calcifications at 9 to 10:00 position ER+ (95%), PR+ (66%), DCIS plus LCIS, heterogeneous somewhat E Cadherin positive some negative      04/26/2014 Breast MRI    Biopsy-proven DCIS is identified in the outer quadrant; ~ 3 cm AP diameter linear clumped NME extends from the medial biopsy cavity posteriorly in the central breast which is suspicious for disease extension      04/26/2014 Clinical Stage    Stage 0: Tis Nx      06/05/2014 Definitive Surgery    Right lumpectomy [name]): ILC, grade 2/3, 0.6 cm, LCIS invasive ca focally present at inferior margin (rt Medial); ILC, grade 2-3, 1.7 cm with neg margins with LCIS (Rt Lateral) ER 100%, PR 99%, Ki 67 6%, HER2/neu neg (ratio 1.14)      06/05/2014 Oncotype testing    Score: 8 (ROR 6%)      06/05/2014 Pathologic Stage    Stage IA: mpT1c pNx      06/23/2014 Surgery    Right breast excision: Benign, 0/3 lymph nodes negative      07/24/2014 - 09/08/2014 Radiation Therapy    Adjuvant RT completed [name]): Right breast 45 Gy over 25 fractions.  Right breast boost 16 Gy over 8 fractions.  Total dose: 60 Gy.        09/25/2014 - 07/24/2016 Anti-estrogen oral therapy    Tamoxifen 20 mg daily ([place]).      11/02/2014 Procedure    Breast/Ovarian panel revealed no clinically significant variant at ATM, BARD1, BRCA1, BRCA2, BRIP1,  CDH1, CHEK2, FANCC, MLH1, MSH2, MSH6, NBN, PALB2, PMS2, PTEN, RAD51C, RAD51D, TP53, and XRCC2      11/03/2014 Survivorship    Survivorship visit completed and copy of survivorship care plan provided to patient.      05/07/2016 Breast MRI    Left breast non-mass enhancement spanning 6.9 cm. Small enhancing mass 5.6 cm, no lymph nodes. Right breast normal      05/20/2016 Pathology Results    Left breast biopsy posterior outer: Invasive lobular cancer with LCIS, ER 95%, PR 0%, Ki-67 20%, HER-2 negative ratio 1.25      07/24/2016 Surgery    Left mastectomy: ILC grade 2, 1.4 cm, margins negative, 0/2 lymph nodes negative, T1 CN 0 stage IA, ER 95%, PR 0%, HER-2 negative, Ki-67 20% Right mastectomy: Benign      08/11/2016 Oncotype testing    Oncotype score 26:17% risk of recurrence, high intermediate risk      08/25/2016 - 10/27/2016 Chemotherapy    Taxotere and Cytoxan 4 cycles       11/10/2016 -  Anti-estrogen oral therapy    Anastrozole 1 mg daily       CURRENT THERAPY: Anastrozole  INTERVAL HISTORY: [name] [name] 52 y.o. female returns for evaluation of her h/o breast cancer.  She continues on Anastrozole.  She still has an expander  in her right breast, and reconstruction with Dr. Marla Roe has not yet been scheduled.  She has not undergone bone density testing.    [name] tolerates her Anastrozole well.  She is up to date with her PCP f/u and cancer screenings.     Patient Active Problem List   Diagnosis Date Noted  . Acquired absence of bilateral breasts and nipples 03/17/2017  . Port catheter in place 09/01/2016  . Malignant neoplasm of upper-inner quadrant of left breast in female, estrogen receptor positive ([place]) 07/31/2016  . Breast cancer ([place]) 07/24/2016  . Genetic testing 11/27/2014  . Family history of breast cancer in female 11/02/2014  . Family history of colon cancer in mother 11/02/2014  . Breast cancer of upper-outer quadrant of right female breast ([place]) 04/26/2014     is allergic to penicillins; shellfish allergy; and tetracyclines & related.  MEDICAL HISTORY: Past Medical History:  Diagnosis Date  . Breast cancer ([place]) 2016   right breast cancer, 2018- Left  . Diabetes mellitus without complication ([place])    TYpe II  . History of radiation therapy 2016  . Pneumonia 04/2016    SURGICAL HISTORY: Past Surgical History:  Procedure Laterality Date  . AXILLARY SENTINEL NODE BIOPSY Right 06/23/2014   Procedure: AXILLARY SENTINEL NODE BIOPSY;  Surgeon: Autumn Messing III, MD;  Location: [place];  Service: General;  Laterality: Right;  . BREAST LUMPECTOMY WITH NEEDLE LOCALIZATION Right 06/05/2014   Procedure: RIGHT BREAST LUMPECTOMY WITH NEEDLE LOCALIZATION;  Surgeon: Autumn Messing III, MD;  Location: [place];  Service: General;  Laterality: Right;  . BREAST RECONSTRUCTION WITH PLACEMENT OF TISSUE EXPANDER AND FLEX HD (ACELLULAR HYDRATED DERMIS) Bilateral 07/24/2016   Procedure: IMMEDIATE BREAST RECONSTRUCTION WITH PLACEMENT OF TISSUE EXPANDER AND FLEX HD (ACELLULAR HYDRATED DERMIS);  Surgeon: Wallace Going, DO;  Location: [place];  Service: Plastics;  Laterality: Bilateral;  . BREAST SURGERY  03/17/2017   LATISSIMUS FLAP TO RIGHT CHEST [place] (Right Chest)  . LATISSIMUS FLAP TO BREAST Right 03/17/2017   Procedure: LATISSIMUS FLAP TO RIGHT CHEST [place];  Surgeon: Wallace Going, DO;  Location: [place];  Service: Plastics;  Laterality: Right;  . MASTECTOMY W/ SENTINEL NODE BIOPSY Bilateral 07/24/2016   Procedure: LEFT MASTECTOMY WITH LEFT SENTINEL LYMPH NODE BIOPSY, RIGHT PROPHYLACTIC MASTECTOMY;  Surgeon: Jovita Kussmaul, MD;  Location: [place];  Service: General;  Laterality: Bilateral;  . PORT-A-CATH REMOVAL N/A 11/10/2016   Procedure: REMOVAL PORT-A-CATH;  Surgeon: Jovita Kussmaul, MD;  Location: [place];  Service: General;  Laterality: N/A;  . PORTACATH PLACEMENT N/A 08/21/2016   Procedure:  INSERTION PORT-A-CATH WITH [place];  Surgeon: Jovita Kussmaul, MD;  Location: [place];  Service: General;  Laterality: N/A;  . RE-EXCISION OF BREAST LUMPECTOMY Right 06/23/2014   Procedure: RE-EXCISION OF RIGHT BREAST INFERIOR MEDIAL MARGIN AND SENTINEL LYMPH NODE BIOPSY;  Surgeon: Autumn Messing III, MD;  Location: [place];  Service: General;  Laterality: Right;  . REMOVAL OF BILATERAL TISSUE EXPANDERS WITH PLACEMENT OF BILATERAL BREAST IMPLANTS Bilateral 06/25/2017   Procedure: REMOVAL OF BILATERAL TISSUE EXPANDERS WITH PLACEMENT OF BILATERAL SILICONE BREAST IMPLANTS;  Surgeon: Wallace Going, DO;  Location: [place];  Service: Plastics;  Laterality: Bilateral;    SOCIAL HISTORY: Social History   Socioeconomic History  . Marital status: Married    Spouse name: Not on file  . Number of children: 2  . Years of education: Not on file  .  Highest education level: Not on file  Occupational History  . Not on file  Social Needs  . Financial resource strain: Not on file  . Food insecurity:    Worry: Not on file    Inability: Not on file  . Transportation needs:    Medical: Not on file    Non-medical: Not on file  Tobacco Use  . Smoking status: Never Smoker  . Smokeless tobacco: Never Used  Substance and Sexual Activity  . Alcohol use: Yes    Alcohol/week: 0.0 oz    Comment: maybe occasionally on holidays  . Drug use: No  . Sexual activity: Not on file  Lifestyle  . Physical activity:    Days per week: Not on file    Minutes per session: Not on file  . Stress: Not on file  Relationships  . Social connections:    Talks on phone: Not on file    Gets together: Not on file    Attends religious service: Not on file    Active member of club or organization: Not on file    Attends meetings of clubs or organizations: Not on file    Relationship status: Not on file  . Intimate partner violence:    Fear of current or ex partner: Not on file    Emotionally  abused: Not on file    Physically abused: Not on file    Forced sexual activity: Not on file  Other Topics Concern  . Not on file  Social History Narrative  . Not on file    FAMILY HISTORY: Family History  Problem Relation Age of Onset  . Colon cancer Mother        dx. late 17s  . Congestive Heart Failure Father        smoker  . Cancer Maternal Aunt        unspecified type; dx. 21s  . Heart attack Maternal Uncle   . Breast cancer Paternal Aunt        dx. 58s  . Heart attack Paternal Uncle   . Heart attack Paternal Grandmother   . Heart attack Paternal Grandfather   . Congestive Heart Failure Paternal Aunt   . Cancer Cousin        unspecified type/unspecified type  . Breast cancer Cousin 52    Review of Systems  Constitutional: Positive for fatigue (getting better, but still present sometimes). Negative for appetite change, chills and unexpected weight change.  HENT:   Negative for hearing loss, lump/mass, sore throat and trouble swallowing.   Eyes: Negative for eye problems and icterus.  Respiratory: Negative for chest tightness, cough and shortness of breath.   Cardiovascular: Negative for leg swelling and palpitations.  Gastrointestinal: Negative for abdominal distention, abdominal pain, constipation, diarrhea, nausea and vomiting.  Endocrine: Negative for hot flashes.  Musculoskeletal: Negative for arthralgias.  Skin: Negative for itching and rash.  Neurological: Negative for dizziness, extremity weakness and headaches.  Psychiatric/Behavioral: Negative for depression. The patient is not nervous/anxious.       PHYSICAL EXAMINATION  ECOG PERFORMANCE STATUS: 1 - Symptomatic but completely ambulatory  Vitals:   07/31/17 1154  BP: 122/70  Pulse: 99  Resp: 20  Temp: 98 F (36.7 C)  SpO2: 100%    Physical Exam  Constitutional: She appears well-developed and well-nourished.  HENT:  Head: Normocephalic and atraumatic.  Mouth/Throat: Oropharynx is clear and  moist. No oropharyngeal exudate.  Eyes: Pupils are equal, round, and reactive to light. No scleral icterus.  Neck: Neck supple.  Pulmonary/Chest: Effort normal and breath sounds normal.  Right breast s/p mastectomy and reconstruction with expander in place, skin is hyperpigmented and has radiation changes, however no nodules or masses are noted, left breast s/p mastectomy and reconstruction with implant in place, no nodules, masses noted.  Abdominal: Soft. Bowel sounds are normal.  Lymphadenopathy:    She has no cervical adenopathy.    LABORATORY DATA:  CBC    Component Value Date/Time   WBC 9.3 03/18/2017 0621   RBC 3.58 (L) 03/18/2017 0621   HGB 9.1 (L) 03/18/2017 0621   HGB 10.4 (L) 01/26/2017 1030   HCT 29.7 (L) 03/18/2017 0621   HCT 33.8 (L) 01/26/2017 1030   PLT 268 03/18/2017 0621   PLT 268 01/26/2017 1030   MCV 83.0 03/18/2017 0621   MCV 85.8 01/26/2017 1030   MCH 25.4 (L) 03/18/2017 0621   MCHC 30.6 03/18/2017 0621   RDW 17.0 (H) 03/18/2017 0621   RDW 16.2 (H) 01/26/2017 1030   LYMPHSABS 0.9 01/26/2017 1030   MONOABS 0.5 01/26/2017 1030   EOSABS 0.2 01/26/2017 1030   BASOSABS 0.0 01/26/2017 1030    CMP     Component Value Date/Time   NA 144 06/24/2017 1707   NA 143 01/26/2017 1030   K 3.8 06/24/2017 1707   K 4.1 01/26/2017 1030   CL 103 06/24/2017 1707   CO2 30 06/24/2017 1707   CO2 27 01/26/2017 1030   GLUCOSE 141 (H) 06/24/2017 1707   GLUCOSE 128 01/26/2017 1030   BUN 6 06/24/2017 1707   BUN 16.7 01/26/2017 1030   CREATININE 0.78 06/24/2017 1707   CREATININE 0.9 01/26/2017 1030   CALCIUM 8.9 06/24/2017 1707   CALCIUM 9.5 01/26/2017 1030   PROT 7.4 01/26/2017 1030   ALBUMIN 3.9 01/26/2017 1030   AST 11 01/26/2017 1030   ALT 14 01/26/2017 1030   ALKPHOS 104 01/26/2017 1030   BILITOT 0.22 01/26/2017 1030   GFRNONAA >60 06/24/2017 1707   GFRAA >60 06/24/2017 1707     ASSESSMENT and PLAN:   Breast cancer of upper-outer quadrant of right female  breast Bilateral mastectomies 07/24/2016  Left mastectomy: ILC grade 2, 1.4 cm, margins negative, 0/2 lymph nodes negative, T1 CN 0 stage IA, ER 95%, PR 0%, HER-2 negative, Ki-67 20% Right mastectomy: Benign  Oncotype DX score 26:17% risk of distant recurrence  Recommendation: 1.Adjuvant chemotherapy with Taxotere and Cytoxan every 3 weeks 4 cycles started 08/25/2016-10/27/2016 2. followed by anastrozole 1 mg daily with ovarian suppression (labs on 08/04/2016 showed FSH21.7 and estradiol less than 2.5)  -------------------------------------------------------------------------- Current treatment: Anastrozole 1 mg daily  Anastrozole toxicities:  Shiva is doing well today.  She is tolerating anastrozole well and will continue this.  I sent in refills today.  She has no clinical sign of recurrence.  We reviewed health maintenance, healthy diet, exercise regularly for wellness.  I ordered a bone density test and gave her a handout on bone health and encouraged continued calcium, vitamin d and weight bearing exercises.    Since Dr. Lindi Adie not in office today, patient would like 6 month f/u to see him.  Return to clinic in 6 months for follow-up   Orders Placed This Encounter  Procedures  . DG Bone Density    Standing Status:   Future    Standing Expiration Date:   07/31/2018    Order Specific Question:   Reason for Exam (SYMPTOM  OR DIAGNOSIS REQUIRED)    Answer:   estrogen  deficiency    Order Specific Question:   Preferred imaging location?    Answer:   [facility]    Order Specific Question:   Is the patient pregnant?    Answer:   No    All questions were answered. The patient knows to call the clinic with any problems, questions or concerns. We can certainly see the patient much sooner if necessary.  A total of (20) minutes of face-to-face time was spent with this patient with greater than 50% of that time in counseling and care-coordination.  This note was electronically  signed. Scot Dock, NP 07/31/2017

## 2017-07-31 NOTE — Patient Instructions (Signed)
Bone Health Bones protect organs, store calcium, and anchor muscles. Good health habits, such as eating nutritious foods and exercising regularly, are important for maintaining healthy bones. They can also help to prevent a condition that causes bones to lose density and become weak and brittle (osteoporosis). Why is bone mass important? Bone mass refers to the amount of bone tissue that you have. The higher your bone mass, the stronger your bones. An important step toward having healthy bones throughout life is to have strong and dense bones during childhood. A young adult who has a high bone mass is more likely to have a high bone mass later in life. Bone mass at its greatest it is called peak bone mass. A large decline in bone mass occurs in older adults. In women, it occurs about the time of menopause. During this time, it is important to practice good health habits, because if more bone is lost than what is replaced, the bones will become less healthy and more likely to break (fracture). If you find that you have a low bone mass, you may be able to prevent osteoporosis or further bone loss by changing your diet and lifestyle. How can I find out if my bone mass is low? Bone mass can be measured with an X-ray test that is called a bone mineral density (BMD) test. This test is recommended for all women who are age 65 or older. It may also be recommended for men who are age 70 or older, or for people who are more likely to develop osteoporosis due to:  Having bones that break easily.  Having a long-term disease that weakens bones, such as kidney disease or rheumatoid arthritis.  Having menopause earlier than normal.  Taking medicine that weakens bones, such as steroids, thyroid hormones, or hormone treatment for breast cancer or prostate cancer.  Smoking.  Drinking three or more alcoholic drinks each day.  What are the nutritional recommendations for healthy bones? To have healthy bones, you  need to get enough of the right minerals and vitamins. Most nutrition experts recommend getting these nutrients from the foods that you eat. Nutritional recommendations vary from person to person. Ask your health care provider what is healthy for you. Here are some general guidelines. Calcium Recommendations Calcium is the most important (essential) mineral for bone health. Most people can get enough calcium from their diet, but supplements may be recommended for people who are at risk for osteoporosis. Good sources of calcium include:  Dairy products, such as low-fat or nonfat milk, cheese, and yogurt.  Dark green leafy vegetables, such as bok choy and broccoli.  Calcium-fortified foods, such as orange juice, cereal, bread, soy beverages, and tofu products.  Nuts, such as almonds.  Follow these recommended amounts for daily calcium intake:  Children, age 1?3: 700 mg.  Children, age 4?8: 1,000 mg.  Children, age 9?13: 1,300 mg.  Teens, age 14?18: 1,300 mg.  Adults, age 19?50: 1,000 mg.  Adults, age 51?70: ? Men: 1,000 mg. ? Women: 1,200 mg.  Adults, age 71 or older: 1,200 mg.  Pregnant and breastfeeding females: ? Teens: 1,300 mg. ? Adults: 1,000 mg.  Vitamin D Recommendations Vitamin D is the most essential vitamin for bone health. It helps the body to absorb calcium. Sunlight stimulates the skin to make vitamin D, so be sure to get enough sunlight. If you live in a cold climate or you do not get outside often, your health care provider may recommend that you take vitamin   D supplements. Good sources of vitamin D in your diet include:  Egg yolks.  Saltwater fish.  Milk and cereal fortified with vitamin D.  Follow these recommended amounts for daily vitamin D intake:  Children and teens, age 1?18: 600 international units.  Adults, age 50 or younger: 400-800 international units.  Adults, age 51 or older: 800-1,000 international units.  Other Nutrients Other nutrients  for bone health include:  Phosphorus. This mineral is found in meat, poultry, dairy foods, nuts, and legumes. The recommended daily intake for adult men and adult women is 700 mg.  Magnesium. This mineral is found in seeds, nuts, dark green vegetables, and legumes. The recommended daily intake for adult men is 400?420 mg. For adult women, it is 310?320 mg.  Vitamin K. This vitamin is found in green leafy vegetables. The recommended daily intake is 120 mg for adult men and 90 mg for adult women.  What type of physical activity is best for building and maintaining healthy bones? Weight-bearing and strength-building activities are important for building and maintaining peak bone mass. Weight-bearing activities cause muscles and bones to work against gravity. Strength-building activities increases muscle strength that supports bones. Weight-bearing and muscle-building activities include:  Walking and hiking.  Jogging and running.  Dancing.  Gym exercises.  Lifting weights.  Tennis and racquetball.  Climbing stairs.  Aerobics.  Adults should get at least 30 minutes of moderate physical activity on most days. Children should get at least 60 minutes of moderate physical activity on most days. Ask your health care provide what type of exercise is best for you. Where can I find more information? For more information, check out the following websites:  National Osteoporosis Foundation: http://nof.org/learn/basics  National Institutes of Health: http://www.niams.nih.gov/Health_Info/Bone/Bone_Health/bone_health_for_life.asp  This information is not intended to replace advice given to you by your health care provider. Make sure you discuss any questions you have with your health care provider. Document Released: 04/19/2003 Document Revised: 08/17/2015 Document Reviewed: 02/01/2014 Elsevier Interactive Patient Education  2018 [employer]  

## 2017-09-04 ENCOUNTER — Ambulatory Visit: Payer: Self-pay | Admitting: Plastic Surgery

## 2017-09-04 DIAGNOSIS — Z9011 Acquired absence of right breast and nipple: Secondary | ICD-10-CM

## 2017-09-04 NOTE — H&P (View-Only) (Signed)
[name] [name] is an 52 y.o. female.   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 She had exchange surgery with removal of left tissue expander and placement of left silicone implant 3/82/50. The right LD flap was erythematous on the day of exchange surgery and the erythema improved following peri operative antibiotics and Dr. Marla Roe felt it was safer to delay exchange surgery on the right side at that time. She also has T2DM.  At today's visit, she reports no problems with the right breast/LD flap and no pain over the area. She is ready for exchange surgery on the right now.   History: RIGHT upper-outer quadrant breast cancer, ER/PR positive, Her 2 negative. She underwent right breast lumpectomy with radiation (08/2014) and tamoxifen therapy.Mammogram was negative but MRI showed a 6.9 cm area of enhancement. Biopsy = invasive lobular cancer and on 05/20/16 the LEFT breast invasive lobular cancer with LCIS, Ki-67 20%, HER-2 negative. The Tamoxifen was stopped 4/26 and switched to anastrozole daily. She completed chemotherapy (Taxotere and Cytoxan) in Sept. 2018.   Past Medical History:  Diagnosis Date  . Breast cancer ([place]) 2016   right breast cancer, 2018- Left  . Diabetes mellitus without complication ([employer])    TYpe II  . History of radiation therapy 2016  . Pneumonia 04/2016    Past Surgical History:  Procedure Laterality Date  . AXILLARY SENTINEL NODE BIOPSY Right 06/23/2014   Procedure: AXILLARY SENTINEL NODE BIOPSY;  Surgeon: Autumn Messing III, MD;  Location: [facility];  Service: General;  Laterality: Right;  . BREAST LUMPECTOMY WITH NEEDLE LOCALIZATION Right 06/05/2014   Procedure: RIGHT BREAST LUMPECTOMY WITH NEEDLE LOCALIZATION;  Surgeon: Autumn Messing III, MD;  Location: [facility];  Service: General;  Laterality: Right;  . BREAST RECONSTRUCTION WITH  PLACEMENT OF TISSUE EXPANDER AND FLEX HD (ACELLULAR HYDRATED DERMIS) Bilateral 07/24/2016   Procedure: IMMEDIATE BREAST RECONSTRUCTION WITH PLACEMENT OF TISSUE EXPANDER AND FLEX HD (ACELLULAR HYDRATED DERMIS);  Surgeon: Wallace Going, DO;  Location: [facility] [place];  Service: Plastics;  Laterality: Bilateral;  . BREAST SURGERY  03/17/2017   LATISSIMUS FLAP TO RIGHT CHEST [place] (Right Chest)  . LATISSIMUS FLAP TO BREAST Right 03/17/2017   Procedure: LATISSIMUS FLAP TO RIGHT CHEST [place];  Surgeon: Wallace Going, DO;  Location: [facility];  Service: Plastics;  Laterality: Right;  . MASTECTOMY W/ SENTINEL NODE BIOPSY Bilateral 07/24/2016   Procedure: LEFT MASTECTOMY WITH LEFT SENTINEL LYMPH NODE BIOPSY, RIGHT PROPHYLACTIC MASTECTOMY;  Surgeon: Jovita Kussmaul, MD;  Location: [facility];  Service: General;  Laterality: Bilateral;  . PORT-A-CATH REMOVAL N/A 11/10/2016   Procedure: REMOVAL PORT-A-CATH;  Surgeon: Jovita Kussmaul, MD;  Location: [facility];  Service: General;  Laterality: N/A;  . PORTACATH PLACEMENT N/A 08/21/2016   Procedure: INSERTION PORT-A-CATH WITH [place];  Surgeon: Jovita Kussmaul, MD;  Location: [facility];  Service: General;  Laterality: N/A;  . RE-EXCISION OF BREAST LUMPECTOMY Right 06/23/2014   Procedure: RE-EXCISION OF RIGHT BREAST INFERIOR MEDIAL MARGIN AND SENTINEL LYMPH NODE BIOPSY;  Surgeon: Autumn Messing III, MD;  Location: [place];  Service: General;  Laterality: Right;  . REMOVAL OF BILATERAL TISSUE EXPANDERS WITH PLACEMENT OF BILATERAL BREAST IMPLANTS Bilateral 06/25/2017   Procedure: REMOVAL OF BILATERAL TISSUE EXPANDERS WITH PLACEMENT OF BILATERAL SILICONE BREAST IMPLANTS;  Surgeon: Wallace Going, DO;  Location: [facility];  Service: Plastics;  Laterality: Bilateral;    Family History  Problem Relation Age of Onset  . Colon cancer Mother        dx. late 84s  . Congestive Heart Failure Father        smoker  . Cancer  Maternal Aunt        unspecified type; dx. 37s  . Heart attack Maternal Uncle   . Breast cancer Paternal Aunt        dx. 61s  . Heart attack Paternal Uncle   . Heart attack Paternal Grandmother   . Heart attack Paternal Grandfather   . Congestive Heart Failure Paternal Aunt   . Cancer Cousin        unspecified type/unspecified type  . Breast cancer Cousin 22   Social History:  reports that she has never smoked. She has never used smokeless tobacco. She reports that she drinks alcohol. She reports that she does not use drugs.  Allergies:  Allergies  Allergen Reactions  . Penicillins Hives, Swelling and Other (See Comments)     PATIENT HAS HAD A PCN REACTION WITH IMMEDIATE RASH, FACIAL/TONGUE/THROAT SWELLING, SOB, OR LIGHTHEADEDNESS WITH HYPOTENSION:  #  #  #  YES  #  #  #   Has patient had a PCN reaction causing severe rash involving mucus membranes or skin necrosis: No Has patient had a PCN reaction that required hospitalization: No Has patient had a PCN reaction occurring within the last 10 years: No If all of the above answers are "NO", then may proceed with Cephalosporin use.   . Shellfish Allergy Hives and Swelling  . Tetracyclines & Related Hives     (Not in a hospital admission)  No results found for this or any previous visit (from the past 48 hour(s)). No results found.  Review of Systems  Constitutional: Negative.   HENT: Negative.   Eyes: Negative.   Cardiovascular: Negative.   Gastrointestinal: Negative.   Genitourinary: Negative.   Musculoskeletal: Negative.   Skin: Negative.   Neurological: Negative.   Psychiatric/Behavioral: Negative.     Last menstrual period 10/06/2014. Physical Exam  Constitutional: She is oriented to person, place, and time. She appears well-developed and well-nourished.  HENT:  Head: Normocephalic and atraumatic.  Eyes: Pupils are equal, round, and reactive to light. Conjunctivae and EOM are normal.  Cardiovascular: Normal  rate.  Respiratory: Effort normal.  GI: Soft.  Neurological: She is alert and oriented to person, place, and time.  Skin: Skin is warm.  Psychiatric: She has a normal mood and affect. Her behavior is normal.     Assessment/Plan Plan for exchange surgery on the right breast..    [place], DO 09/04/2017, 5:38 PM

## 2017-09-04 NOTE — H&P (Signed)
[name] [name] is an 52 y.o. female.   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 She had exchange surgery with removal of left tissue expander and placement of left silicone implant 3/47/42. The right LD flap was erythematous on the day of exchange surgery and the erythema improved following peri operative antibiotics and Dr. Marla Roe felt it was safer to delay exchange surgery on the right side at that time. She also has T2DM.  At today's visit, she reports no problems with the right breast/LD flap and no pain over the area. She is ready for exchange surgery on the right now.   History: RIGHT upper-outer quadrant breast cancer, ER/PR positive, Her 2 negative. She underwent right breast lumpectomy with radiation (08/2014) and tamoxifen therapy.Mammogram was negative but MRI showed a 6.9 cm area of enhancement. Biopsy = invasive lobular cancer and on 05/20/16 the LEFT breast invasive lobular cancer with LCIS, Ki-67 20%, HER-2 negative. The Tamoxifen was stopped 4/26 and switched to anastrozole daily. She completed chemotherapy (Taxotere and Cytoxan) in Sept. 2018.   Past Medical History:  Diagnosis Date  . Breast cancer ([place]) 2016   right breast cancer, 2018- Left  . Diabetes mellitus without complication ([place])    TYpe II  . History of radiation therapy 2016  . Pneumonia 04/2016    Past Surgical History:  Procedure Laterality Date  . AXILLARY SENTINEL NODE BIOPSY Right 06/23/2014   Procedure: AXILLARY SENTINEL NODE BIOPSY;  Surgeon: Autumn Messing III, MD;  Location: [facility];  Service: General;  Laterality: Right;  . BREAST LUMPECTOMY WITH NEEDLE LOCALIZATION Right 06/05/2014   Procedure: RIGHT BREAST LUMPECTOMY WITH NEEDLE LOCALIZATION;  Surgeon: Autumn Messing III, MD;  Location: [facility];  Service: General;  Laterality: Right;  . BREAST RECONSTRUCTION WITH  PLACEMENT OF TISSUE EXPANDER AND FLEX HD (ACELLULAR HYDRATED DERMIS) Bilateral 07/24/2016   Procedure: IMMEDIATE BREAST RECONSTRUCTION WITH PLACEMENT OF TISSUE EXPANDER AND FLEX HD (ACELLULAR HYDRATED DERMIS);  Surgeon: Wallace Going, DO;  Location: [facility];  Service: Plastics;  Laterality: Bilateral;  . BREAST SURGERY  03/17/2017   LATISSIMUS FLAP TO RIGHT CHEST [place] (Right Chest)  . LATISSIMUS FLAP TO BREAST Right 03/17/2017   Procedure: LATISSIMUS FLAP TO RIGHT CHEST [place];  Surgeon: Wallace Going, DO;  Location: [facility];  Service: Plastics;  Laterality: Right;  . MASTECTOMY W/ SENTINEL NODE BIOPSY Bilateral 07/24/2016   Procedure: LEFT MASTECTOMY WITH LEFT SENTINEL LYMPH NODE BIOPSY, RIGHT PROPHYLACTIC MASTECTOMY;  Surgeon: Jovita Kussmaul, MD;  Location: [facility];  Service: General;  Laterality: Bilateral;  . PORT-A-CATH REMOVAL N/A 11/10/2016   Procedure: REMOVAL PORT-A-CATH;  Surgeon: Jovita Kussmaul, MD;  Location: [facility];  Service: General;  Laterality: N/A;  . PORTACATH PLACEMENT N/A 08/21/2016   Procedure: INSERTION PORT-A-CATH WITH [place];  Surgeon: Jovita Kussmaul, MD;  Location: [facility];  Service: General;  Laterality: N/A;  . RE-EXCISION OF BREAST LUMPECTOMY Right 06/23/2014   Procedure: RE-EXCISION OF RIGHT BREAST INFERIOR MEDIAL MARGIN AND SENTINEL LYMPH NODE BIOPSY;  Surgeon: Autumn Messing III, MD;  Location: [facility];  Service: General;  Laterality: Right;  . REMOVAL OF BILATERAL TISSUE EXPANDERS WITH PLACEMENT OF BILATERAL BREAST IMPLANTS Bilateral 06/25/2017   Procedure: REMOVAL OF BILATERAL TISSUE EXPANDERS WITH PLACEMENT OF BILATERAL SILICONE BREAST IMPLANTS;  Surgeon: Wallace Going, DO;  Location: [facility];  Service: Plastics;  Laterality: Bilateral;    Family History  Problem Relation Age of Onset  . Colon cancer Mother        dx. late 37s  . Congestive Heart Failure Father        smoker  . Cancer  Maternal Aunt        unspecified type; dx. 73s  . Heart attack Maternal Uncle   . Breast cancer Paternal Aunt        dx. 29s  . Heart attack Paternal Uncle   . Heart attack Paternal Grandmother   . Heart attack Paternal Grandfather   . Congestive Heart Failure Paternal Aunt   . Cancer Cousin        unspecified type/unspecified type  . Breast cancer Cousin 64   Social History:  reports that she has never smoked. She has never used smokeless tobacco. She reports that she drinks alcohol. She reports that she does not use drugs.  Allergies:  Allergies  Allergen Reactions  . Penicillins Hives, Swelling and Other (See Comments)     PATIENT HAS HAD A PCN REACTION WITH IMMEDIATE RASH, FACIAL/TONGUE/THROAT SWELLING, SOB, OR LIGHTHEADEDNESS WITH HYPOTENSION:  #  #  #  YES  #  #  #   Has patient had a PCN reaction causing severe rash involving mucus membranes or skin necrosis: No Has patient had a PCN reaction that required hospitalization: No Has patient had a PCN reaction occurring within the last 10 years: No If all of the above answers are "NO", then may proceed with Cephalosporin use.   . Shellfish Allergy Hives and Swelling  . Tetracyclines & Related Hives     (Not in a hospital admission)  No results found for this or any previous visit (from the past 48 hour(s)). No results found.  Review of Systems  Constitutional: Negative.   HENT: Negative.   Eyes: Negative.   Cardiovascular: Negative.   Gastrointestinal: Negative.   Genitourinary: Negative.   Musculoskeletal: Negative.   Skin: Negative.   Neurological: Negative.   Psychiatric/Behavioral: Negative.     Last menstrual period 10/06/2014. Physical Exam  Constitutional: She is oriented to person, place, and time. She appears well-developed and well-nourished.  HENT:  Head: Normocephalic and atraumatic.  Eyes: Pupils are equal, round, and reactive to light. Conjunctivae and EOM are normal.  Cardiovascular: Normal  rate.  Respiratory: Effort normal.  GI: Soft.  Neurological: She is alert and oriented to person, place, and time.  Skin: Skin is warm.  Psychiatric: She has a normal mood and affect. Her behavior is normal.     Assessment/Plan Plan for exchange surgery on the right breast..    Craig, DO 09/04/2017, 5:38 PM

## 2017-09-08 NOTE — Pre-Procedure Instructions (Addendum)
[name] [name]  09/08/2017      [id number] - [place], [place] - [address] [place] [address] Phone: [phone] Fax: [phone]  [id number] - [place], [place] AT [address] [place] [address] Phone: [phone] Fax: [phone]  [id number] - [place] [address] Phone: [phone] Fax: [phone]  [place] [place] [facility] [address] [place] [address] [place] [address] Phone: [phone] Fax: [phone]    Your procedure is scheduled on  Thursday 09/10/17  Report to [facility] at 530 A.M.  Call this number if you have problems the morning of surgery:  [phone]   Remember:  Do not eat or drink after midnight.     Take these medicines the morning of surgery with A SIP OF WATER - ANASTROZOLE, CLARITIN  7 days prior to surgery STOP taking any Aspirin(unless otherwise instructed by your surgeon), Aleve, Naproxen, Ibuprofen, Motrin, Advil, Goody's, BC's, all herbal medications, fish oil, and all vitamins     How to Manage Your Diabetes Before and After Surgery  Why is it important to control my blood sugar before and after surgery? . Improving blood sugar levels before and after surgery helps healing and can limit problems. . A way of improving blood sugar control is eating a healthy diet by: o  Eating less sugar and carbohydrates o  Increasing activity/exercise o  Talking with your doctor about reaching your blood sugar goals . High blood sugars (greater than 180 mg/dL) can raise your risk of infections and slow your recovery, so you will need to focus on controlling your diabetes during the weeks before surgery. . Make sure that the doctor who takes care of your diabetes knows about your  planned surgery including the date and location.  How do I manage my blood sugar before surgery? . Check your blood sugar at least 4 times a day, starting 2 days before surgery, to make sure that the level is not too high or low. o Check your blood sugar the morning of your surgery when you wake up and every 2 hours until you get to the Short Stay unit. . If your blood sugar is less than 70 mg/dL, you will need to treat for low blood sugar: o Do not take insulin. o Treat a low blood sugar (less than 70 mg/dL) with  cup of clear juice (cranberry or apple), 4 glucose tablets, OR glucose gel. Recheck blood sugar in 15 minutes after treatment (to make sure it is greater than 70 mg/dL). If your blood sugar is not greater than 70 mg/dL on recheck, call [phone] o  for further instructions. . Report your blood sugar to the short stay nurse when you get to Short Stay.  . If you are admitted to the hospital after surgery: o Your blood sugar will be checked by the staff and you will probably be given insulin after surgery (instead of oral diabetes medicines) to make sure you have good blood sugar levels. o The goal for blood sugar control after surgery is 80-180 mg/dL.       WHAT DO I DO ABOUT MY DIABETES MEDICATION?   Marland Kitchen Do not take oral diabetes medicines (pills) the  morning of surgery. .     Do not wear jewelry, make-up or nail polish.  Do not wear lotions, powders, or perfumes, or deodorant.  Do not shave 48 hours prior to surgery.  Men may shave face and neck.  Do not bring valuables to the hospital.  [facility] is not responsible for any belongings or valuables.  Contacts, dentures or bridgework may not be worn into surgery.  Leave your suitcase in the car.  After surgery it may be brought to your room.  For patients admitted to the hospital, discharge time will be determined by your treatment team.  Patients discharged the day of surgery will not be allowed to drive home.    Name and phone number of your driver:    Special instructions:  [facility] - Preparing for Surgery  Before surgery, you can play an important role.  Because skin is not sterile, your skin needs to be as free of germs as possible.  You can reduce the number of germs on you skin by washing with CHG (chlorahexidine gluconate) soap before surgery.  CHG is an antiseptic cleaner which kills germs and bonds with the skin to continue killing germs even after washing.  Oral Hygiene is also important in reducing the risk of infection.  Remember to brush your teeth with your regular toothpaste the morning of surgery.  Please DO NOT use if you have an allergy to CHG or antibacterial soaps.  If your skin becomes reddened/irritated stop using the CHG and inform your nurse when you arrive at Short Stay.  Do not shave (including legs and underarms) for at least 48 hours prior to the first CHG shower.  You may shave your face.  Please follow these instructions carefully:   1.  Shower with CHG Soap the night before surgery and the morning of Surgery.  2.  If you choose to wash your hair, wash your hair first as usual with your normal shampoo.  3.  After you shampoo, rinse your hair and body thoroughly to remove the shampoo. 4.  Use CHG as you would any other liquid soap.  You can apply chg directly to the skin and wash gently with a      scrungie or washcloth.           5.  Apply the CHG Soap to your body ONLY FROM THE NECK DOWN.   Do not use on open wounds or open sores. Avoid contact with your eyes, ears, mouth and genitals (private parts).  Wash genitals (private parts) with your normal soap.  6.  Wash thoroughly, paying special attention to the area where your surgery will be performed.  7.  Thoroughly rinse your body with warm water from the neck down.  8.  DO NOT shower/wash with your normal soap after using and rinsing off the CHG Soap.  9.  Pat yourself dry with a clean towel.            10.  Wear  clean pajamas.            11.  Place clean sheets on your bed the night of your first shower and do not sleep with pets.  Day of Surgery  Do not apply any lotions/deoderants the morning of surgery.   Please wear clean clothes to the hospital/surgery center. Remember to brush your teeth with toothpaste.     Please read over the following fact sheets that you were given. Pain Booklet

## 2017-09-09 ENCOUNTER — Encounter (HOSPITAL_COMMUNITY): Payer: Self-pay | Admitting: Anesthesiology

## 2017-09-09 ENCOUNTER — Encounter (HOSPITAL_COMMUNITY)
Admission: RE | Admit: 2017-09-09 | Discharge: 2017-09-09 | Disposition: A | Discharge status: Home / Self Care | Payer: 59 | Source: Ambulatory Visit | Attending: Plastic Surgery | Admitting: Plastic Surgery

## 2017-09-09 ENCOUNTER — Encounter (HOSPITAL_COMMUNITY): Payer: Self-pay

## 2017-09-09 ENCOUNTER — Other Ambulatory Visit: Payer: Self-pay

## 2017-09-09 DIAGNOSIS — Z6838 Body mass index (BMI) 38.0-38.9, adult: Secondary | ICD-10-CM | POA: Diagnosis not present

## 2017-09-09 DIAGNOSIS — Z7984 Long term (current) use of oral hypoglycemic drugs: Secondary | ICD-10-CM | POA: Diagnosis not present

## 2017-09-09 DIAGNOSIS — E119 Type 2 diabetes mellitus without complications: Secondary | ICD-10-CM | POA: Diagnosis not present

## 2017-09-09 DIAGNOSIS — Z853 Personal history of malignant neoplasm of breast: Secondary | ICD-10-CM | POA: Diagnosis present

## 2017-09-09 LAB — CBC
HCT: 33.5 % — ABNORMAL LOW (ref 36.0–46.0)
HEMOGLOBIN: 10 g/dL — AB (ref 12.0–15.0)
MCH: 24.5 pg — ABNORMAL LOW (ref 26.0–34.0)
MCHC: 29.9 g/dL — ABNORMAL LOW (ref 30.0–36.0)
MCV: 82.1 fL (ref 78.0–100.0)
PLATELETS: 324 10*3/uL (ref 150–400)
RBC: 4.08 MIL/uL (ref 3.87–5.11)
RDW: 18.5 % — ABNORMAL HIGH (ref 11.5–15.5)
WBC: 5.5 10*3/uL (ref 4.0–10.5)

## 2017-09-09 LAB — HEMOGLOBIN A1C
Hgb A1c MFr Bld: 7.3 % — ABNORMAL HIGH (ref 4.8–5.6)
MEAN PLASMA GLUCOSE: 162.81 mg/dL

## 2017-09-09 LAB — BASIC METABOLIC PANEL
ANION GAP: 8 (ref 5–15)
BUN: 14 mg/dL (ref 6–20)
CALCIUM: 9.2 mg/dL (ref 8.9–10.3)
CHLORIDE: 105 mmol/L (ref 98–111)
CO2: 30 mmol/L (ref 22–32)
Creatinine, Ser: 0.81 mg/dL (ref 0.44–1.00)
GFR calc non Af Amer: >60 mL/min (ref >60)
GLUCOSE: 125 mg/dL — AB (ref 70–99)
POTASSIUM: 4 mmol/L (ref 3.5–5.1)
Sodium: 143 mmol/L (ref 135–145)

## 2017-09-09 LAB — GLUCOSE, CAPILLARY: Glucose-Capillary: 119 mg/dL — ABNORMAL HIGH (ref 70–99)

## 2017-09-09 NOTE — Anesthesia Preprocedure Evaluation (Addendum)
Anesthesia Evaluation  Patient identified by MRN, date of birth, ID band Patient awake    Reviewed: Allergy & Precautions, NPO status , Patient's Chart, lab work & pertinent test results  Airway Mallampati: I       Dental no notable dental hx. (+) Teeth Intact   Pulmonary    Pulmonary exam normal breath sounds clear to auscultation       Cardiovascular negative cardio ROS Normal cardiovascular exam Rhythm:Regular Rate:Normal     Neuro/Psych negative neurological ROS     GI/Hepatic negative GI ROS, Neg liver ROS,   Endo/Other  diabetes, Oral Hypoglycemic AgentsMorbid obesity  Renal/GU negative Renal ROS  negative genitourinary   Musculoskeletal negative musculoskeletal ROS (+)   Abdominal (+) + obese,   Peds  Hematology negative hematology ROS (+)   Anesthesia Other Findings   Reproductive/Obstetrics                            Anesthesia Physical Anesthesia Plan  ASA: III  Anesthesia Plan: General   Post-op Pain Management:    Induction: Intravenous  PONV Risk Score and Plan: 4 or greater  Airway Management Planned: LMA  Additional Equipment:   Intra-op Plan:   Post-operative Plan:   Informed Consent: I have reviewed the patients History and Physical, chart, labs and discussed the procedure including the risks, benefits and alternatives for the proposed anesthesia with the patient or authorized representative who has indicated his/her understanding and acceptance.   Dental advisory given  Plan Discussed with: CRNA and Surgeon  Anesthesia Plan Comments:        Anesthesia Quick Evaluation

## 2017-09-10 ENCOUNTER — Ambulatory Visit (HOSPITAL_COMMUNITY): Payer: 59 | Admitting: Anesthesiology

## 2017-09-10 ENCOUNTER — Encounter (HOSPITAL_COMMUNITY)
Admission: RE | Disposition: A | Discharge status: Home / Self Care | Payer: Self-pay | Source: Ambulatory Visit | Attending: Plastic Surgery

## 2017-09-10 ENCOUNTER — Encounter (HOSPITAL_COMMUNITY): Payer: Self-pay | Admitting: *Deleted

## 2017-09-10 ENCOUNTER — Ambulatory Visit (HOSPITAL_COMMUNITY)
Admission: RE | Admit: 2017-09-10 | Discharge: 2017-09-10 | Disposition: A | Discharge status: Home / Self Care | Payer: 59 | Source: Ambulatory Visit | Attending: Plastic Surgery | Admitting: Plastic Surgery

## 2017-09-10 DIAGNOSIS — E119 Type 2 diabetes mellitus without complications: Secondary | ICD-10-CM | POA: Diagnosis not present

## 2017-09-10 DIAGNOSIS — Z9011 Acquired absence of right breast and nipple: Secondary | ICD-10-CM

## 2017-09-10 DIAGNOSIS — Z853 Personal history of malignant neoplasm of breast: Secondary | ICD-10-CM | POA: Insufficient documentation

## 2017-09-10 DIAGNOSIS — Z421 Encounter for breast reconstruction following mastectomy: Secondary | ICD-10-CM | POA: Diagnosis not present

## 2017-09-10 DIAGNOSIS — Z6838 Body mass index (BMI) 38.0-38.9, adult: Secondary | ICD-10-CM | POA: Insufficient documentation

## 2017-09-10 DIAGNOSIS — L905 Scar conditions and fibrosis of skin: Secondary | ICD-10-CM | POA: Diagnosis not present

## 2017-09-10 DIAGNOSIS — Z7984 Long term (current) use of oral hypoglycemic drugs: Secondary | ICD-10-CM | POA: Insufficient documentation

## 2017-09-10 HISTORY — PX: REMOVAL OF TISSUE EXPANDER AND PLACEMENT OF IMPLANT: SHX6457

## 2017-09-10 LAB — GLUCOSE, CAPILLARY
Glucose-Capillary: 124 mg/dL — ABNORMAL HIGH (ref 70–99)
Glucose-Capillary: 131 mg/dL — ABNORMAL HIGH (ref 70–99)

## 2017-09-10 SURGERY — REMOVAL, TISSUE EXPANDER, BREAST, WITH IMPLANT INSERTION
Anesthesia: General | Site: Breast | Laterality: Right

## 2017-09-10 MED ORDER — 0.9 % SODIUM CHLORIDE (POUR BTL) OPTIME
TOPICAL | Status: DC | PRN
  Administered 2017-09-10 (×2): 1000 mL

## 2017-09-10 MED ORDER — BUPIVACAINE-EPINEPHRINE (PF) 0.25% -1:200000 IJ SOLN
INTRAMUSCULAR | Status: DC | PRN
  Administered 2017-09-10: 6 mL

## 2017-09-10 MED ORDER — SODIUM CHLORIDE 0.9% FLUSH: 3.0000 mL | Freq: Two times a day (BID) | INTRAVENOUS | Status: DC

## 2017-09-10 MED ORDER — CIPROFLOXACIN IN D5W 400 MG/200ML IV SOLN
INTRAVENOUS | Status: AC
  Filled 2017-09-10: qty 200

## 2017-09-10 MED ORDER — PHENYLEPHRINE HCL 10 MG/ML IJ SOLN
INTRAMUSCULAR | Status: DC | PRN
  Administered 2017-09-10 (×3): 80 ug via INTRAVENOUS
  Administered 2017-09-10: 120 ug via INTRAVENOUS
  Administered 2017-09-10: 80 ug via INTRAVENOUS

## 2017-09-10 MED ORDER — MIDAZOLAM HCL 5 MG/5ML IJ SOLN
INTRAMUSCULAR | Status: DC | PRN
  Administered 2017-09-10: 2 mg via INTRAVENOUS

## 2017-09-10 MED ORDER — CIPROFLOXACIN IN D5W 400 MG/200ML IV SOLN
400.0000 mg | INTRAVENOUS | Status: AC
  Administered 2017-09-10: 400 mg via INTRAVENOUS

## 2017-09-10 MED ORDER — PROPOFOL 10 MG/ML IV BOLUS
INTRAVENOUS | Status: DC | PRN
  Administered 2017-09-10: 150 mg via INTRAVENOUS

## 2017-09-10 MED ORDER — KETOROLAC TROMETHAMINE 30 MG/ML IJ SOLN: 30.0000 mg | Freq: Once | INTRAMUSCULAR | Status: DC | PRN

## 2017-09-10 MED ORDER — PROPOFOL 10 MG/ML IV BOLUS
INTRAVENOUS | Status: AC
  Filled 2017-09-10: qty 20

## 2017-09-10 MED ORDER — FENTANYL CITRATE (PF) 100 MCG/2ML IJ SOLN
INTRAMUSCULAR | Status: AC
  Filled 2017-09-10: qty 2

## 2017-09-10 MED ORDER — IBUPROFEN 200 MG PO TABS
200.0000 mg | ORAL_TABLET | Freq: Four times a day (QID) | ORAL | Status: DC | PRN
  Filled 2017-09-10: qty 2

## 2017-09-10 MED ORDER — SODIUM CHLORIDE 0.9 % IV SOLN
INTRAVENOUS | Status: DC | PRN
  Administered 2017-09-10: 500 mL

## 2017-09-10 MED ORDER — SODIUM CHLORIDE 0.9 % IV SOLN: 250.0000 mL | INTRAVENOUS | Status: DC | PRN

## 2017-09-10 MED ORDER — DEXAMETHASONE SODIUM PHOSPHATE 10 MG/ML IJ SOLN
INTRAMUSCULAR | Status: DC | PRN
  Administered 2017-09-10: 10 mg via INTRAVENOUS

## 2017-09-10 MED ORDER — LACTATED RINGERS IV SOLN
INTRAVENOUS | Status: DC | PRN
  Administered 2017-09-10 (×2): via INTRAVENOUS

## 2017-09-10 MED ORDER — ONDANSETRON HCL 4 MG/2ML IJ SOLN: 4.0000 mg | Freq: Once | INTRAMUSCULAR | Status: DC | PRN

## 2017-09-10 MED ORDER — SODIUM CHLORIDE 0.9% FLUSH: 3.0000 mL | INTRAVENOUS | Status: DC | PRN

## 2017-09-10 MED ORDER — OXYCODONE HCL 5 MG/5ML PO SOLN: 5.0000 mg | Freq: Once | ORAL | Status: AC | PRN

## 2017-09-10 MED ORDER — LIDOCAINE HCL (CARDIAC) PF 100 MG/5ML IV SOSY
PREFILLED_SYRINGE | INTRAVENOUS | Status: DC | PRN
  Administered 2017-09-10: 100 mg via INTRAVENOUS

## 2017-09-10 MED ORDER — OXYCODONE HCL 5 MG PO TABS: 5.0000 mg | ORAL_TABLET | ORAL | Status: DC | PRN

## 2017-09-10 MED ORDER — ACETAMINOPHEN 650 MG RE SUPP: 650.0000 mg | RECTAL | Status: DC | PRN

## 2017-09-10 MED ORDER — ONDANSETRON HCL 4 MG/2ML IJ SOLN
INTRAMUSCULAR | Status: DC | PRN
  Administered 2017-09-10: 4 mg via INTRAVENOUS

## 2017-09-10 MED ORDER — FENTANYL CITRATE (PF) 100 MCG/2ML IJ SOLN
INTRAMUSCULAR | Status: DC | PRN
  Administered 2017-09-10 (×2): 25 ug via INTRAVENOUS
  Administered 2017-09-10: 100 ug via INTRAVENOUS

## 2017-09-10 MED ORDER — IBUPROFEN 100 MG/5ML PO SUSP
200.0000 mg | Freq: Four times a day (QID) | ORAL | Status: DC | PRN
  Filled 2017-09-10: qty 20

## 2017-09-10 MED ORDER — FENTANYL CITRATE (PF) 250 MCG/5ML IJ SOLN
INTRAMUSCULAR | Status: AC
Start: 2017-09-10 — End: ?
  Filled 2017-09-10: qty 5

## 2017-09-10 MED ORDER — OXYCODONE HCL 5 MG PO TABS
ORAL_TABLET | ORAL | Status: AC
  Filled 2017-09-10: qty 1

## 2017-09-10 MED ORDER — FENTANYL CITRATE (PF) 100 MCG/2ML IJ SOLN: 25.0000 ug | INTRAMUSCULAR | Status: DC | PRN

## 2017-09-10 MED ORDER — ACETAMINOPHEN 325 MG PO TABS
650.0000 mg | ORAL_TABLET | ORAL | Status: DC | PRN
Start: 2017-09-10 — End: 2017-09-10

## 2017-09-10 MED ORDER — MIDAZOLAM HCL 2 MG/2ML IJ SOLN
INTRAMUSCULAR | Status: AC
  Filled 2017-09-10: qty 2

## 2017-09-10 MED ORDER — BUPIVACAINE-EPINEPHRINE (PF) 0.25% -1:200000 IJ SOLN
INTRAMUSCULAR | Status: AC
  Filled 2017-09-10: qty 30

## 2017-09-10 MED ORDER — OXYCODONE HCL 5 MG PO TABS
5.0000 mg | ORAL_TABLET | Freq: Once | ORAL | Status: AC | PRN
  Administered 2017-09-10: 5 mg via ORAL

## 2017-09-10 MED ORDER — MEPERIDINE HCL 50 MG/ML IJ SOLN: 6.2500 mg | INTRAMUSCULAR | Status: DC | PRN

## 2017-09-10 SURGICAL SUPPLY — 56 items
ADH SKN CLS APL DERMABOND .7 (GAUZE/BANDAGES/DRESSINGS) ×1
BAG DECANTER FOR FLEXI CONT (MISCELLANEOUS) ×2 IMPLANT
BINDER BREAST LRG (GAUZE/BANDAGES/DRESSINGS) IMPLANT
BINDER BREAST XLRG (GAUZE/BANDAGES/DRESSINGS) IMPLANT
BINDER BREAST XXLRG (GAUZE/BANDAGES/DRESSINGS) ×1 IMPLANT
BIOPATCH RED 1 DISK 7.0 (GAUZE/BANDAGES/DRESSINGS) ×2 IMPLANT
BLADE 10 SAFETY STRL DISP (BLADE) ×2 IMPLANT
CANISTER SUCT 3000ML PPV (MISCELLANEOUS) ×2 IMPLANT
CHLORAPREP W/TINT 26ML (MISCELLANEOUS) ×2 IMPLANT
COVER SURGICAL LIGHT HANDLE (MISCELLANEOUS) ×2 IMPLANT
DERMABOND ADVANCED (GAUZE/BANDAGES/DRESSINGS) ×1
DERMABOND ADVANCED .7 DNX12 (GAUZE/BANDAGES/DRESSINGS) ×1 IMPLANT
DRAIN CHANNEL 19F RND (DRAIN) ×2 IMPLANT
DRAPE HALF SHEET 40X57 (DRAPES) ×4 IMPLANT
DRAPE ORTHO SPLIT 77X108 STRL (DRAPES) ×4
DRAPE SURG 17X23 STRL (DRAPES) ×8 IMPLANT
DRAPE SURG ORHT 6 SPLT 77X108 (DRAPES) ×2 IMPLANT
DRAPE WARM FLUID 44X44 (DRAPE) ×2 IMPLANT
DRSG PAD ABDOMINAL 8X10 ST (GAUZE/BANDAGES/DRESSINGS) ×4 IMPLANT
ELECT BLADE 4.0 EZ CLEAN MEGAD (MISCELLANEOUS) ×2
ELECT REM PT RETURN 9FT ADLT (ELECTROSURGICAL) ×2
ELECTRODE BLDE 4.0 EZ CLN MEGD (MISCELLANEOUS) ×1 IMPLANT
ELECTRODE REM PT RTRN 9FT ADLT (ELECTROSURGICAL) ×1 IMPLANT
EVACUATOR SILICONE 100CC (DRAIN) ×2 IMPLANT
GAUZE SPONGE 4X4 12PLY STRL (GAUZE/BANDAGES/DRESSINGS) ×2 IMPLANT
GLOVE BIO SURGEON STRL SZ 6.5 (GLOVE) ×4 IMPLANT
GOWN STRL REUS W/ TWL LRG LVL3 (GOWN DISPOSABLE) ×2 IMPLANT
GOWN STRL REUS W/TWL LRG LVL3 (GOWN DISPOSABLE) ×4
IMPL BREAST P5.3XHI PRFL RND (Breast) IMPLANT
IMPL BRST P5.3XHI PRFL RND (Breast) ×1 IMPLANT
IMPLANT SILICONE 475CC (Breast) ×2 IMPLANT
KIT BASIN OR (CUSTOM PROCEDURE TRAY) ×2 IMPLANT
KIT TURNOVER KIT B (KITS) ×2 IMPLANT
NS IRRIG 1000ML POUR BTL (IV SOLUTION) ×4 IMPLANT
PACK GENERAL/GYN (CUSTOM PROCEDURE TRAY) ×2 IMPLANT
PAD ABD 8X10 STRL (GAUZE/BANDAGES/DRESSINGS) ×1 IMPLANT
PAD ARMBOARD 7.5X6 YLW CONV (MISCELLANEOUS) ×2 IMPLANT
PIN SAFETY STERILE (MISCELLANEOUS) ×2 IMPLANT
SET ASEPTIC TRANSFER (MISCELLANEOUS) ×1 IMPLANT
SIZER BREAST GEL REUSE 475CC (SIZER) ×2
SIZER BREAST GEL REUSE 480CC (SIZER) ×2
SIZER BRST GEL REUSE 475CC (SIZER) IMPLANT
SIZER BRST GEL REUSE 480CC (SIZER) IMPLANT
STAPLER VISISTAT 35W (STAPLE) IMPLANT
SUT MNCRL AB 3-0 PS2 18 (SUTURE) ×1 IMPLANT
SUT MNCRL AB 4-0 PS2 18 (SUTURE) ×1 IMPLANT
SUT MON AB 3-0 SH 27 (SUTURE) ×2
SUT MON AB 3-0 SH27 (SUTURE) IMPLANT
SUT MON AB 5-0 PS2 18 (SUTURE) ×4 IMPLANT
SUT PDS AB 2-0 CT1 27 (SUTURE) IMPLANT
SUT SILK 3 0 SH 30 (SUTURE) ×2 IMPLANT
SUT VIC AB 3-0 SH 27 (SUTURE) ×6
SUT VIC AB 3-0 SH 27X BRD (SUTURE) ×3 IMPLANT
SUT VICRYL 4-0 PS2 18IN ABS (SUTURE) ×4 IMPLANT
TOWEL OR 17X26 10 PK STRL BLUE (TOWEL DISPOSABLE) ×2 IMPLANT
TRAY FOLEY MTR SLVR 14FR STAT (SET/KITS/TRAYS/PACK) IMPLANT

## 2017-09-10 NOTE — Op Note (Signed)
Op report Unilateral Breast Exchange   DATE OF OPERATION:  09/10/2017  LOCATION: [facility]  SURGICAL DIVISION: Plastic Surgery  PREOPERATIVE DIAGNOSES:  1. History of right breast cancer.  2. Acquired absence of right breast.   POSTOPERATIVE DIAGNOSES:  1. History of right breast cancer.  2. Acquired absence of right breast.   PROCEDURE:  1. Exchange of right tissue expander for implant. 2. Capsulotomies for implant respositioning.  SURGEON: Claire Sanger Dillingham, DO  ANESTHESIA:  General.   COMPLICATIONS: None.   IMPLANTS: [place] Smooth Round High Profile Gel 475cc. Ref #[id number].  Serial Number [id number]  INDICATIONS FOR PROCEDURE:  The patient, [name] [name], is a 52 y.o. female born on [birth year], is here for treatment for further treatment after a mastectomy and placement of a tissue expander. She now presents for exchange of her expander for an implant.  She requires capsulotomies to better position the implant. MRN: [id number]  CONSENT:  Informed consent was obtained directly from the patient. Risks, benefits and alternatives were fully discussed. Specific risks including but not limited to bleeding, infection, hematoma, seroma, scarring, pain, implant infection, implant extrusion, capsular contracture, asymmetry, wound healing problems, and need for further surgery were all discussed. The patient did have an ample opportunity to have her questions answered to her satisfaction.   DESCRIPTION OF PROCEDURE:  The patient was taken to the operating room. SCDs were placed and IV antibiotics were given. The patient's chest was prepped and draped in a sterile fashion. A time out was performed and the implants to be used were identified.  Local with epinephrine was used to infiltrate the area.   The old mastectomy scar at the inferior portion of the latissimus flap was excised and sent to pathology.  The bovie was used to dissect to the muscle and  split the muscle.  Attention was given not to undermine the flap and skin paddle. The expander was located and drained.  The expander was removed and there was a normal healthy capsule.  The pocket was irrigated with antibiotic solution.  Circumferential capsulotomies were performed to allow for breast pocket expansion.  Measurements were made to confirm adequate pocket size for the implant dimensions.  Hemostasis was ensured with electrocautery.  The pocket was irrigated with antibiotic solution.  New gloves were placed.  The implant was placed in the pocket and oriented appropriately. The muscle and capsule on the anterior surface were re-closed with a 3-0 running Monocryl suture. The remaining skin was closed with 4-0 Monocryl deep dermal and 5-0 Monocryl subcuticular stitches.  Dermabond was applied.  A breast binder and ABD was applied.  The patient was allowed to wake from anesthesia and taken to the recovery room in satisfactory condition.

## 2017-09-10 NOTE — Transfer of Care (Signed)
Immediate Anesthesia Transfer of Care Note  Patient: [name] [name]  Procedure(s) Performed: REMOVAL OF TISSUE EXPANDER AND PLACEMENT OF IMPLANT (Right Breast)  Patient Location: PACU  Anesthesia Type:General  Level of Consciousness: awake, alert  and oriented  Airway & Oxygen Therapy: Patient Spontanous Breathing and Patient connected to nasal cannula oxygen  Post-op Assessment: Report given to RN, Post -op Vital signs reviewed and stable and Patient moving all extremities  Post vital signs: Reviewed and stable  Last Vitals:  Vitals Value Taken Time  BP 158/105 09/10/2017  9:07 AM  Temp    Pulse 80 09/10/2017  9:09 AM  Resp 17 09/10/2017  9:09 AM  SpO2 100 % 09/10/2017  9:09 AM  Vitals shown include unvalidated device data.  Last Pain:  Vitals:   09/10/17 0608  TempSrc: Oral  PainSc:          Complications: No apparent anesthesia complications

## 2017-09-10 NOTE — Anesthesia Postprocedure Evaluation (Signed)
Anesthesia Post Note  Patient: [name] [name]  Procedure(s) Performed: REMOVAL OF TISSUE EXPANDER AND PLACEMENT OF IMPLANT (Right Breast)     Patient location during evaluation: PACU Anesthesia Type: General Level of consciousness: awake Pain management: pain level controlled Vital Signs Assessment: post-procedure vital signs reviewed and stable Respiratory status: spontaneous breathing Cardiovascular status: stable Postop Assessment: no apparent nausea or vomiting Anesthetic complications: no    Last Vitals:  Vitals:   09/10/17 1008 09/10/17 1015  BP: (!) 146/92 (!) 147/93  Pulse: 80   Resp: (!) 25   Temp:    SpO2: 100% 95%    Last Pain:  Vitals:   09/10/17 1015  TempSrc:   PainSc: 4    Pain Goal:                 [name] [name] JR,JOHN Bearl Talarico

## 2017-09-10 NOTE — Discharge Instructions (Signed)
May shower tomorrow. No heavy lifting. Continue binder or sports bra.

## 2017-09-10 NOTE — Anesthesia Procedure Notes (Signed)
Procedure Name: LMA Insertion Date/Time: 09/10/2017 7:38 AM Performed by: Judiann Celia T, CRNA Pre-anesthesia Checklist: Patient identified, Emergency Drugs available, Suction available and Patient being monitored Patient Re-evaluated:Patient Re-evaluated prior to induction Oxygen Delivery Method: Circle system utilized Preoxygenation: Pre-oxygenation with 100% oxygen Induction Type: IV induction Ventilation: Mask ventilation without difficulty LMA: LMA inserted LMA Size: 5.0 Number of attempts: 1 Airway Equipment and Method: Patient positioned with wedge pillow Placement Confirmation: positive ETCO2 and breath sounds checked- equal and bilateral Tube secured with: Tape Dental Injury: Teeth and Oropharynx as per pre-operative assessment

## 2017-09-10 NOTE — Interval H&P Note (Signed)
History and Physical Interval Note:  09/10/2017 7:14 AM  [name] [name]  has presented today for surgery, with the diagnosis of Acquired absence of bilateral breasts and nipples, History of right breast cancer  The various methods of treatment have been discussed with the patient and family. After consideration of risks, benefits and other options for treatment, the patient has consented to  Procedure(s): REMOVAL OF TISSUE EXPANDER AND PLACEMENT OF IMPLANT (Right) as a surgical intervention .  The patient's history has been reviewed, patient examined, no change in status, stable for surgery.  I have reviewed the patient's chart and labs.  Questions were answered to the patient's satisfaction.     Loel Lofty [name] [name]

## 2017-09-15 ENCOUNTER — Encounter (HOSPITAL_COMMUNITY): Payer: Self-pay | Admitting: Plastic Surgery

## 2017-09-22 DIAGNOSIS — E6609 Other obesity due to excess calories: Secondary | ICD-10-CM | POA: Diagnosis not present

## 2017-09-22 DIAGNOSIS — E119 Type 2 diabetes mellitus without complications: Secondary | ICD-10-CM | POA: Diagnosis not present

## 2017-09-22 DIAGNOSIS — Z853 Personal history of malignant neoplasm of breast: Secondary | ICD-10-CM | POA: Diagnosis not present

## 2017-11-02 DIAGNOSIS — Z1211 Encounter for screening for malignant neoplasm of colon: Secondary | ICD-10-CM | POA: Diagnosis not present

## 2017-11-12 DIAGNOSIS — H16292 Other keratoconjunctivitis, left eye: Secondary | ICD-10-CM | POA: Diagnosis not present

## 2017-11-13 ENCOUNTER — Ambulatory Visit (INDEPENDENT_AMBULATORY_CARE_PROVIDER_SITE_OTHER): Payer: 59 | Admitting: Plastic Surgery

## 2017-11-13 ENCOUNTER — Encounter: Payer: Self-pay | Admitting: Plastic Surgery

## 2017-11-13 VITALS — BP 129/70 | HR 94 | Resp 14 | Ht 67.0 in | Wt 250.0 lb

## 2017-11-13 DIAGNOSIS — C50411 Malignant neoplasm of upper-outer quadrant of right female breast: Secondary | ICD-10-CM

## 2017-11-13 DIAGNOSIS — Z170 Estrogen receptor positive status [ER+]: Secondary | ICD-10-CM

## 2017-11-13 DIAGNOSIS — Z803 Family history of malignant neoplasm of breast: Secondary | ICD-10-CM | POA: Diagnosis not present

## 2017-11-13 DIAGNOSIS — Z9013 Acquired absence of bilateral breasts and nipples: Secondary | ICD-10-CM | POA: Diagnosis not present

## 2017-11-13 NOTE — Progress Notes (Signed)
   Subjective:    Patient ID: [name] [name], female    DOB: [birth year], 52 y.o.   MRN: [id number]  The patient is a 52 yrs old bf here for follow up on her bilateral breast reconstruction.  She has implants on both sides and a right latissimus muscle flap.  She is overall pleased with the results.  She was wanting more cleavage but understands there are limitation due to the radiation.  There is some bulge on the medial and lateral left breast and lateral right breast.  She has some volume loss on the upper left breast.  She is interested in symmetry surgery revision.  This is reasonable.       Review of Systems  Constitutional: Negative.  Negative for activity change and appetite change.  HENT: Negative.   Eyes: Negative.   Respiratory: Negative.  Negative for chest tightness and shortness of breath.   Cardiovascular: Negative.   Gastrointestinal: Negative.   Endocrine: Negative.   Genitourinary: Negative.   Musculoskeletal: Negative.   Skin: Negative.  Negative for color change and wound.  Psychiatric/Behavioral: Negative.        Objective:   Physical Exam  Constitutional: She is oriented to person, place, and time. She appears well-developed and well-nourished.  HENT:  Head: Normocephalic and atraumatic.  Eyes: Pupils are equal, round, and reactive to light. EOM are normal.  Cardiovascular: Normal rate.  Pulmonary/Chest: Effort normal. No respiratory distress.  Neurological: She is alert and oriented to person, place, and time.  Skin: Skin is warm. No rash noted. No erythema.      Assessment & Plan:  Family history of breast cancer in female  Malignant neoplasm of upper-outer quadrant of right breast in female, estrogen receptor positive ([place])  Acquired absence of bilateral breasts and nipples  We discussed options for revision and together have decided for fat grafting to the upper left breast, liposuction to the lateral breast and medial inferior left breast.  She  is aware to get a spanx and bring it to surgery for postop use.

## 2017-11-18 DIAGNOSIS — H16292 Other keratoconjunctivitis, left eye: Secondary | ICD-10-CM | POA: Diagnosis not present

## 2017-12-11 DIAGNOSIS — K640 First degree hemorrhoids: Secondary | ICD-10-CM | POA: Diagnosis not present

## 2017-12-11 DIAGNOSIS — Z800 Family history of malignant neoplasm of digestive organs: Secondary | ICD-10-CM | POA: Diagnosis not present

## 2017-12-11 DIAGNOSIS — Z1211 Encounter for screening for malignant neoplasm of colon: Secondary | ICD-10-CM | POA: Diagnosis not present

## 2018-01-13 ENCOUNTER — Other Ambulatory Visit: Payer: Self-pay

## 2018-01-13 ENCOUNTER — Encounter (HOSPITAL_BASED_OUTPATIENT_CLINIC_OR_DEPARTMENT_OTHER): Payer: Self-pay | Admitting: *Deleted

## 2018-01-15 ENCOUNTER — Ambulatory Visit (INDEPENDENT_AMBULATORY_CARE_PROVIDER_SITE_OTHER): Payer: Self-pay | Admitting: Plastic Surgery

## 2018-01-15 ENCOUNTER — Encounter (HOSPITAL_BASED_OUTPATIENT_CLINIC_OR_DEPARTMENT_OTHER)
Admission: RE | Admit: 2018-01-15 | Discharge: 2018-01-15 | Disposition: A | Discharge status: Home / Self Care | Payer: 59 | Source: Ambulatory Visit | Attending: Plastic Surgery | Admitting: Plastic Surgery

## 2018-01-15 ENCOUNTER — Encounter: Payer: Self-pay | Admitting: Plastic Surgery

## 2018-01-15 VITALS — BP 135/82 | HR 100 | Resp 16 | Ht 67.5 in | Wt 250.0 lb

## 2018-01-15 DIAGNOSIS — Z9013 Acquired absence of bilateral breasts and nipples: Secondary | ICD-10-CM | POA: Diagnosis not present

## 2018-01-15 DIAGNOSIS — E119 Type 2 diabetes mellitus without complications: Secondary | ICD-10-CM | POA: Diagnosis not present

## 2018-01-15 DIAGNOSIS — N651 Disproportion of reconstructed breast: Secondary | ICD-10-CM | POA: Insufficient documentation

## 2018-01-15 DIAGNOSIS — N6489 Other specified disorders of breast: Secondary | ICD-10-CM | POA: Diagnosis not present

## 2018-01-15 DIAGNOSIS — Z853 Personal history of malignant neoplasm of breast: Secondary | ICD-10-CM | POA: Diagnosis not present

## 2018-01-15 DIAGNOSIS — Z79899 Other long term (current) drug therapy: Secondary | ICD-10-CM | POA: Diagnosis not present

## 2018-01-15 DIAGNOSIS — Z923 Personal history of irradiation: Secondary | ICD-10-CM | POA: Diagnosis not present

## 2018-01-15 DIAGNOSIS — Z7984 Long term (current) use of oral hypoglycemic drugs: Secondary | ICD-10-CM | POA: Diagnosis not present

## 2018-01-15 LAB — BASIC METABOLIC PANEL
Anion gap: 11 (ref 5–15)
BUN: 10 mg/dL (ref 6–20)
CALCIUM: 9.1 mg/dL (ref 8.9–10.3)
CO2: 25 mmol/L (ref 22–32)
Chloride: 103 mmol/L (ref 98–111)
Creatinine, Ser: 0.92 mg/dL (ref 0.44–1.00)
GFR calc Af Amer: >60 mL/min (ref >60)
GFR calc non Af Amer: >60 mL/min (ref >60)
Glucose, Bld: 138 mg/dL — ABNORMAL HIGH (ref 70–99)
Potassium: 3.9 mmol/L (ref 3.5–5.1)
Sodium: 139 mmol/L (ref 135–145)

## 2018-01-15 MED ORDER — HYDROCODONE-ACETAMINOPHEN 5-325 MG PO TABS: 1.0000 | ORAL_TABLET | Freq: Four times a day (QID) | ORAL | 0 refills | Status: AC | PRN

## 2018-01-15 MED ORDER — CIPROFLOXACIN HCL 500 MG PO TABS: 500.0000 mg | ORAL_TABLET | Freq: Two times a day (BID) | ORAL | 0 refills | Status: AC

## 2018-01-15 MED ORDER — ONDANSETRON HCL 4 MG PO TABS: 4.0000 mg | ORAL_TABLET | Freq: Three times a day (TID) | ORAL | 0 refills | Status: AC | PRN

## 2018-01-15 NOTE — Progress Notes (Signed)
Patient ID: [name] [name], female    DOB: Apr 13, 1965, 52 y.o.   MRN: [id number]   Chief Complaint  Patient presents with  . Breast Problem    [name] [name] is a 52 year old black female here for her history and physical for breast symmetry surgery.  She underwent bilateral mastectomies with reconstruction using expanders and a latissimus on the right.  Her implants are in place and doing well.  She has excellent symmetry and is very happy.  There is some volume loss in the superior medial aspect of both breasts.  She would like that filled for better contour and symmetry.  She has not had any recent illnesses and is doing quite well.  There is no sign of infection and all incisions are well-healed.   Review of Systems  Constitutional: Negative.  Negative for activity change and appetite change.  HENT: Negative.   Eyes: Negative.   Respiratory: Negative.   Gastrointestinal: Negative.   Endocrine: Negative.   Genitourinary: Negative.   Musculoskeletal: Negative.   Skin: Negative.  Negative for color change and wound.  Neurological: Negative.   Psychiatric/Behavioral: Negative.     Past Medical History:  Diagnosis Date  . Breast cancer ([place]) 2016   right breast cancer, 2018- Left  . Diabetes mellitus without complication ([place])    TYpe II  . History of radiation therapy 2016  . Pneumonia 04/2016    Past Surgical History:  Procedure Laterality Date  . AXILLARY SENTINEL NODE BIOPSY Right 06/23/2014   Procedure: AXILLARY SENTINEL NODE BIOPSY;  Surgeon: Autumn Messing III, MD;  Location: [facility];  Service: General;  Laterality: Right;  . BREAST LUMPECTOMY WITH NEEDLE LOCALIZATION Right 06/05/2014   Procedure: RIGHT BREAST LUMPECTOMY WITH NEEDLE LOCALIZATION;  Surgeon: Autumn Messing III, MD;  Location: [facility];  Service: General;  Laterality: Right;  . BREAST RECONSTRUCTION WITH PLACEMENT OF TISSUE EXPANDER AND FLEX HD (ACELLULAR HYDRATED DERMIS) Bilateral  07/24/2016   Procedure: IMMEDIATE BREAST RECONSTRUCTION WITH PLACEMENT OF TISSUE EXPANDER AND FLEX HD (ACELLULAR HYDRATED DERMIS);  Surgeon: Wallace Going, DO;  Location: [facility];  Service: Plastics;  Laterality: Bilateral;  . BREAST SURGERY  03/17/2017   LATISSIMUS FLAP TO RIGHT CHEST [place] (Right Chest)  . LATISSIMUS FLAP TO BREAST Right 03/17/2017   Procedure: LATISSIMUS FLAP TO RIGHT CHEST [place];  Surgeon: Wallace Going, DO;  Location: [facility];  Service: Plastics;  Laterality: Right;  . MASTECTOMY W/ SENTINEL NODE BIOPSY Bilateral 07/24/2016   Procedure: LEFT MASTECTOMY WITH LEFT SENTINEL LYMPH NODE BIOPSY, RIGHT PROPHYLACTIC MASTECTOMY;  Surgeon: Jovita Kussmaul, MD;  Location: [facility];  Service: General;  Laterality: Bilateral;  . PORT-A-CATH REMOVAL N/A 11/10/2016   Procedure: REMOVAL PORT-A-CATH;  Surgeon: Jovita Kussmaul, MD;  Location: [facility] [place];  Service: General;  Laterality: N/A;  . PORTACATH PLACEMENT N/A 08/21/2016   Procedure: INSERTION PORT-A-CATH WITH [place];  Surgeon: Jovita Kussmaul, MD;  Location: [place] [place];  Service: General;  Laterality: N/A;  . RE-EXCISION OF BREAST LUMPECTOMY Right 06/23/2014   Procedure: RE-EXCISION OF RIGHT BREAST INFERIOR MEDIAL MARGIN AND SENTINEL LYMPH NODE BIOPSY;  Surgeon: Autumn Messing III, MD;  Location: [facility];  Service: General;  Laterality: Right;  . REMOVAL OF BILATERAL TISSUE EXPANDERS WITH PLACEMENT OF BILATERAL BREAST IMPLANTS Bilateral 06/25/2017   Procedure: REMOVAL OF LEFT TISSUE EXPANDERS WITH PLACEMENT OF LEFT SILICONE BREAST IMPLANTS;  Surgeon: Wallace Going, DO;  Location[facility];  Service: Plastics;  Laterality: Bilateral;  . REMOVAL OF TISSUE EXPANDER AND PLACEMENT OF IMPLANT Right 09/10/2017   Procedure: REMOVAL OF TISSUE EXPANDER AND PLACEMENT OF IMPLANT;  Surgeon: Wallace Going, DO;  Location: [facility];  Service: Plastics;  Laterality: Right;      Current  Outpatient Medications:  .  anastrozole (ARIMIDEX) 1 MG tablet, Take 1 tablet (1 mg total) by mouth daily., Disp: 90 tablet, Rfl: 4 .  atorvastatin (LIPITOR) 10 MG tablet, Take 1 tablet (10 mg total) by mouth daily., Disp: , Rfl:  .  ciprofloxacin (CIPRO) 500 MG tablet, Take 1 tablet (500 mg total) by mouth 2 (two) times daily for 3 days., Disp: 6 tablet, Rfl: 0 .  glimepiride (AMARYL) 4 MG tablet, Take 1 tablet (4 mg total) by mouth daily with breakfast., Disp: , Rfl:  .  HYDROcodone-acetaminophen (NORCO) 5-325 MG tablet, Take 1 tablet by mouth every 6 (six) hours as needed for up to 5 days for moderate pain., Disp: 6 tablet, Rfl: 0 .  metFORMIN (GLUCOPHAGE) 1000 MG tablet, Take 1,000 mg by mouth daily. , Disp: , Rfl:  .  methocarbamol (ROBAXIN) 500 MG tablet, Take 1 tablet (500 mg total) by mouth every 6 (six) hours as needed for muscle spasms. (Patient not taking: Reported on 09/02/2017), Disp: 30 tablet, Rfl: 1 .  ondansetron (ZOFRAN) 4 MG tablet, Take 1 tablet (4 mg total) by mouth every 8 (eight) hours as needed for up to 5 days for nausea or vomiting., Disp: 15 tablet, Rfl: 0   Objective:   Vitals:   01/15/18 1538  BP: 135/82  Pulse: 100  Resp: 16  SpO2: 96%    Physical Exam  Constitutional: She is oriented to person, place, and time. She appears well-developed and well-nourished.  HENT:  Head: Normocephalic and atraumatic.  Eyes: Pupils are equal, round, and reactive to light. EOM are normal.  Cardiovascular: Normal rate.  Pulmonary/Chest: Effort normal.  Abdominal: Soft. She exhibits no distension.  Neurological: She is alert and oriented to person, place, and time.  Skin: Skin is warm.    Assessment & Plan:  Acquired absence of bilateral breasts and nipples  Breast asymmetry following reconstructive surgery  Plan for lipophilic bilateral breasts. The risks that can be encountered with and after surgery were discussed and include the following but not limited to  these: bleeding, infection, delayed healing, anesthesia risks, skin sensation changes, injury to structures including nerves, blood vessels, and muscles which may be temporary or permanent, allergies to tape, suture materials and glues, blood products, topical preparations or injected agents, skin contour irregularities, skin discoloration and swelling, deep vein thrombosis, cardiac and pulmonary complications, pain, which may persist, persistent pain, recurrence of the lesion, poor healing of the incision, possible need for revisional surgery or staged procedures.   [place], DO

## 2018-01-15 NOTE — H&P (View-Only) (Signed)
Patient ID: [name] [name], female    DOB: 03/26/1965, 52 y.o.   MRN: [id number]   Chief Complaint  Patient presents with  . Breast Problem    [name] [name] is a 52 year old black female here for her history and physical for breast symmetry surgery.  She underwent bilateral mastectomies with reconstruction using expanders and a latissimus on the right.  Her implants are in place and doing well.  She has excellent symmetry and is very happy.  There is some volume loss in the superior medial aspect of both breasts.  She would like that filled for better contour and symmetry.  She has not had any recent illnesses and is doing quite well.  There is no sign of infection and all incisions are well-healed.   Review of Systems  Constitutional: Negative.  Negative for activity change and appetite change.  HENT: Negative.   Eyes: Negative.   Respiratory: Negative.   Gastrointestinal: Negative.   Endocrine: Negative.   Genitourinary: Negative.   Musculoskeletal: Negative.   Skin: Negative.  Negative for color change and wound.  Neurological: Negative.   Psychiatric/Behavioral: Negative.     Past Medical History:  Diagnosis Date  . Breast cancer ([place]) 2016   right breast cancer, 2018- Left  . Diabetes mellitus without complication ([place])    TYpe II  . History of radiation therapy 2016  . Pneumonia 04/2016    Past Surgical History:  Procedure Laterality Date  . AXILLARY SENTINEL NODE BIOPSY Right 06/23/2014   Procedure: AXILLARY SENTINEL NODE BIOPSY;  Surgeon: Autumn Messing III, MD;  Location: [facility];  Service: General;  Laterality: Right;  . BREAST LUMPECTOMY WITH NEEDLE LOCALIZATION Right 06/05/2014   Procedure: RIGHT BREAST LUMPECTOMY WITH NEEDLE LOCALIZATION;  Surgeon: Autumn Messing III, MD;  Location: [facility];  Service: General;  Laterality: Right;  . BREAST RECONSTRUCTION WITH PLACEMENT OF TISSUE EXPANDER AND FLEX HD (ACELLULAR HYDRATED DERMIS) Bilateral  07/24/2016   Procedure: IMMEDIATE BREAST RECONSTRUCTION WITH PLACEMENT OF TISSUE EXPANDER AND FLEX HD (ACELLULAR HYDRATED DERMIS);  Surgeon: Wallace Going, DO;  Location: [facility];  Service: Plastics;  Laterality: Bilateral;  . BREAST SURGERY  03/17/2017   LATISSIMUS FLAP TO RIGHT CHEST [place] (Right Chest)  . LATISSIMUS FLAP TO BREAST Right 03/17/2017   Procedure: LATISSIMUS FLAP TO RIGHT CHEST [place];  Surgeon: Wallace Going, DO;  Location: [facility];  Service: Plastics;  Laterality: Right;  . MASTECTOMY W/ SENTINEL NODE BIOPSY Bilateral 07/24/2016   Procedure: LEFT MASTECTOMY WITH LEFT SENTINEL LYMPH NODE BIOPSY, RIGHT PROPHYLACTIC MASTECTOMY;  Surgeon: Jovita Kussmaul, MD;  Location: [facility];  Service: General;  Laterality: Bilateral;  . PORT-A-CATH REMOVAL N/A 11/10/2016   Procedure: REMOVAL PORT-A-CATH;  Surgeon: Jovita Kussmaul, MD;  Location: [facility];  Service: General;  Laterality: N/A;  . PORTACATH PLACEMENT N/A 08/21/2016   Procedure: INSERTION PORT-A-CATH WITH [place];  Surgeon: Jovita Kussmaul, MD;  Location: [facility];  Service: General;  Laterality: N/A;  . RE-EXCISION OF BREAST LUMPECTOMY Right 06/23/2014   Procedure: RE-EXCISION OF RIGHT BREAST INFERIOR MEDIAL MARGIN AND SENTINEL LYMPH NODE BIOPSY;  Surgeon: Autumn Messing III, MD;  Location: [facility];  Service: General;  Laterality: Right;  . REMOVAL OF BILATERAL TISSUE EXPANDERS WITH PLACEMENT OF BILATERAL BREAST IMPLANTS Bilateral 06/25/2017   Procedure: REMOVAL OF LEFT TISSUE EXPANDERS WITH PLACEMENT OF LEFT SILICONE BREAST IMPLANTS;  Surgeon: Wallace Going, DO;  Location[facility];  Service: Plastics;  Laterality: Bilateral;  . REMOVAL OF TISSUE EXPANDER AND PLACEMENT OF IMPLANT Right 09/10/2017   Procedure: REMOVAL OF TISSUE EXPANDER AND PLACEMENT OF IMPLANT;  Surgeon: Wallace Going, DO;  Location: [facility];  Service: Plastics;  Laterality: Right;      Current  Outpatient Medications:  .  anastrozole (ARIMIDEX) 1 MG tablet, Take 1 tablet (1 mg total) by mouth daily., Disp: 90 tablet, Rfl: 4 .  atorvastatin (LIPITOR) 10 MG tablet, Take 1 tablet (10 mg total) by mouth daily., Disp: , Rfl:  .  ciprofloxacin (CIPRO) 500 MG tablet, Take 1 tablet (500 mg total) by mouth 2 (two) times daily for 3 days., Disp: 6 tablet, Rfl: 0 .  glimepiride (AMARYL) 4 MG tablet, Take 1 tablet (4 mg total) by mouth daily with breakfast., Disp: , Rfl:  .  HYDROcodone-acetaminophen (NORCO) 5-325 MG tablet, Take 1 tablet by mouth every 6 (six) hours as needed for up to 5 days for moderate pain., Disp: 6 tablet, Rfl: 0 .  metFORMIN (GLUCOPHAGE) 1000 MG tablet, Take 1,000 mg by mouth daily. , Disp: , Rfl:  .  methocarbamol (ROBAXIN) 500 MG tablet, Take 1 tablet (500 mg total) by mouth every 6 (six) hours as needed for muscle spasms. (Patient not taking: Reported on 09/02/2017), Disp: 30 tablet, Rfl: 1 .  ondansetron (ZOFRAN) 4 MG tablet, Take 1 tablet (4 mg total) by mouth every 8 (eight) hours as needed for up to 5 days for nausea or vomiting., Disp: 15 tablet, Rfl: 0   Objective:   Vitals:   01/15/18 1538  BP: 135/82  Pulse: 100  Resp: 16  SpO2: 96%    Physical Exam  Constitutional: She is oriented to person, place, and time. She appears well-developed and well-nourished.  HENT:  Head: Normocephalic and atraumatic.  Eyes: Pupils are equal, round, and reactive to light. EOM are normal.  Cardiovascular: Normal rate.  Pulmonary/Chest: Effort normal.  Abdominal: Soft. She exhibits no distension.  Neurological: She is alert and oriented to person, place, and time.  Skin: Skin is warm.    Assessment & Plan:  Acquired absence of bilateral breasts and nipples  Breast asymmetry following reconstructive surgery  Plan for lipophilic bilateral breasts. The risks that can be encountered with and after surgery were discussed and include the following but not limited to  these: bleeding, infection, delayed healing, anesthesia risks, skin sensation changes, injury to structures including nerves, blood vessels, and muscles which may be temporary or permanent, allergies to tape, suture materials and glues, blood products, topical preparations or injected agents, skin contour irregularities, skin discoloration and swelling, deep vein thrombosis, cardiac and pulmonary complications, pain, which may persist, persistent pain, recurrence of the lesion, poor healing of the incision, possible need for revisional surgery or staged procedures.   [place], DO

## 2018-01-21 ENCOUNTER — Ambulatory Visit (HOSPITAL_BASED_OUTPATIENT_CLINIC_OR_DEPARTMENT_OTHER)
Admission: RE | Admit: 2018-01-21 | Discharge: 2018-01-21 | Disposition: A | Discharge status: Home / Self Care | Payer: 59 | Attending: Plastic Surgery | Admitting: Plastic Surgery

## 2018-01-21 ENCOUNTER — Ambulatory Visit (HOSPITAL_BASED_OUTPATIENT_CLINIC_OR_DEPARTMENT_OTHER): Payer: 59 | Admitting: Anesthesiology

## 2018-01-21 ENCOUNTER — Encounter (HOSPITAL_BASED_OUTPATIENT_CLINIC_OR_DEPARTMENT_OTHER): Payer: Self-pay | Admitting: Anesthesiology

## 2018-01-21 ENCOUNTER — Encounter (HOSPITAL_BASED_OUTPATIENT_CLINIC_OR_DEPARTMENT_OTHER)
Admission: RE | Disposition: A | Discharge status: Home / Self Care | Payer: Self-pay | Source: Home / Self Care | Attending: Plastic Surgery

## 2018-01-21 ENCOUNTER — Other Ambulatory Visit: Payer: Self-pay

## 2018-01-21 DIAGNOSIS — Z79899 Other long term (current) drug therapy: Secondary | ICD-10-CM | POA: Insufficient documentation

## 2018-01-21 DIAGNOSIS — N650 Deformity of reconstructed breast: Secondary | ICD-10-CM | POA: Diagnosis not present

## 2018-01-21 DIAGNOSIS — Z9013 Acquired absence of bilateral breasts and nipples: Secondary | ICD-10-CM

## 2018-01-21 DIAGNOSIS — N6489 Other specified disorders of breast: Secondary | ICD-10-CM | POA: Insufficient documentation

## 2018-01-21 DIAGNOSIS — Z421 Encounter for breast reconstruction following mastectomy: Secondary | ICD-10-CM | POA: Diagnosis not present

## 2018-01-21 DIAGNOSIS — E119 Type 2 diabetes mellitus without complications: Secondary | ICD-10-CM | POA: Diagnosis not present

## 2018-01-21 DIAGNOSIS — Z7984 Long term (current) use of oral hypoglycemic drugs: Secondary | ICD-10-CM | POA: Insufficient documentation

## 2018-01-21 DIAGNOSIS — N651 Disproportion of reconstructed breast: Secondary | ICD-10-CM | POA: Diagnosis not present

## 2018-01-21 DIAGNOSIS — Z853 Personal history of malignant neoplasm of breast: Secondary | ICD-10-CM | POA: Insufficient documentation

## 2018-01-21 DIAGNOSIS — Z923 Personal history of irradiation: Secondary | ICD-10-CM | POA: Insufficient documentation

## 2018-01-21 HISTORY — PX: LIPOSUCTION WITH LIPOFILLING: SHX6436

## 2018-01-21 LAB — GLUCOSE, CAPILLARY
GLUCOSE-CAPILLARY: 111 mg/dL — AB (ref 70–99)
GLUCOSE-CAPILLARY: 165 mg/dL — AB (ref 70–99)

## 2018-01-21 SURGERY — LIPOSUCTION, WITH FAT TRANSFER
Anesthesia: General | Laterality: Bilateral

## 2018-01-21 MED ORDER — DEXAMETHASONE SODIUM PHOSPHATE 4 MG/ML IJ SOLN
INTRAMUSCULAR | Status: DC | PRN
  Administered 2018-01-21: 5 mg via INTRAVENOUS

## 2018-01-21 MED ORDER — LIDOCAINE HCL (PF) 1 % IJ SOLN
INTRAMUSCULAR | Status: AC
  Filled 2018-01-21: qty 60

## 2018-01-21 MED ORDER — LACTATED RINGERS IV SOLN
INTRAVENOUS | Status: DC
  Administered 2018-01-21 (×2): via INTRAVENOUS

## 2018-01-21 MED ORDER — ROCURONIUM BROMIDE 100 MG/10ML IV SOLN
INTRAVENOUS | Status: DC | PRN
  Administered 2018-01-21: 50 mg via INTRAVENOUS

## 2018-01-21 MED ORDER — MIDAZOLAM HCL 2 MG/2ML IJ SOLN
1.0000 mg | INTRAMUSCULAR | Status: DC | PRN
  Administered 2018-01-21: 2 mg via INTRAVENOUS

## 2018-01-21 MED ORDER — FENTANYL CITRATE (PF) 100 MCG/2ML IJ SOLN
50.0000 ug | INTRAMUSCULAR | Status: AC | PRN
  Administered 2018-01-21 (×4): 50 ug via INTRAVENOUS

## 2018-01-21 MED ORDER — EPHEDRINE 5 MG/ML INJ
INTRAVENOUS | Status: AC
  Filled 2018-01-21: qty 10

## 2018-01-21 MED ORDER — SUCCINYLCHOLINE CHLORIDE 200 MG/10ML IV SOSY
PREFILLED_SYRINGE | INTRAVENOUS | Status: AC
  Filled 2018-01-21: qty 10

## 2018-01-21 MED ORDER — ONDANSETRON HCL 4 MG/2ML IJ SOLN
INTRAMUSCULAR | Status: AC
  Filled 2018-01-21: qty 2

## 2018-01-21 MED ORDER — PHENYLEPHRINE HCL 10 MG/ML IJ SOLN
INTRAMUSCULAR | Status: DC | PRN
  Administered 2018-01-21: 80 ug via INTRAVENOUS
  Administered 2018-01-21: 40 ug via INTRAVENOUS

## 2018-01-21 MED ORDER — OXYCODONE HCL 5 MG PO TABS: 5.0000 mg | ORAL_TABLET | ORAL | Status: DC | PRN

## 2018-01-21 MED ORDER — ROCURONIUM BROMIDE 50 MG/5ML IV SOSY
PREFILLED_SYRINGE | INTRAVENOUS | Status: AC
  Filled 2018-01-21: qty 5

## 2018-01-21 MED ORDER — LIDOCAINE-EPINEPHRINE 1 %-1:100000 IJ SOLN
INTRAMUSCULAR | Status: AC
  Filled 2018-01-21: qty 1

## 2018-01-21 MED ORDER — SODIUM CHLORIDE 0.9 % IV SOLN: 250.0000 mL | INTRAVENOUS | Status: DC | PRN

## 2018-01-21 MED ORDER — OXYCODONE HCL 5 MG PO TABS
ORAL_TABLET | ORAL | Status: AC
  Filled 2018-01-21: qty 1

## 2018-01-21 MED ORDER — OXYCODONE HCL 5 MG PO TABS
5.0000 mg | ORAL_TABLET | Freq: Once | ORAL | Status: AC | PRN
  Administered 2018-01-21: 5 mg via ORAL

## 2018-01-21 MED ORDER — FENTANYL CITRATE (PF) 100 MCG/2ML IJ SOLN
INTRAMUSCULAR | Status: AC
  Filled 2018-01-21: qty 2

## 2018-01-21 MED ORDER — FENTANYL CITRATE (PF) 100 MCG/2ML IJ SOLN
25.0000 ug | INTRAMUSCULAR | Status: DC | PRN
  Administered 2018-01-21: 25 ug via INTRAVENOUS
  Administered 2018-01-21: 50 ug via INTRAVENOUS

## 2018-01-21 MED ORDER — LIDOCAINE HCL 1 % IJ SOLN
INTRAVENOUS | Status: DC | PRN
  Administered 2018-01-21: 1000 mL

## 2018-01-21 MED ORDER — LIDOCAINE-EPINEPHRINE (PF) 1 %-1:200000 IJ SOLN
INTRAMUSCULAR | Status: AC
  Filled 2018-01-21: qty 60

## 2018-01-21 MED ORDER — DEXAMETHASONE SODIUM PHOSPHATE 10 MG/ML IJ SOLN
INTRAMUSCULAR | Status: AC
  Filled 2018-01-21: qty 1

## 2018-01-21 MED ORDER — ONDANSETRON HCL 4 MG/2ML IJ SOLN: 4.0000 mg | Freq: Four times a day (QID) | INTRAMUSCULAR | Status: DC | PRN

## 2018-01-21 MED ORDER — SUGAMMADEX SODIUM 500 MG/5ML IV SOLN
INTRAVENOUS | Status: AC
  Filled 2018-01-21: qty 5

## 2018-01-21 MED ORDER — CIPROFLOXACIN IN D5W 400 MG/200ML IV SOLN
INTRAVENOUS | Status: AC
  Filled 2018-01-21: qty 200

## 2018-01-21 MED ORDER — SCOPOLAMINE 1 MG/3DAYS TD PT72: 1.0000 | MEDICATED_PATCH | Freq: Once | TRANSDERMAL | Status: DC | PRN

## 2018-01-21 MED ORDER — BUPIVACAINE-EPINEPHRINE (PF) 0.25% -1:200000 IJ SOLN
INTRAMUSCULAR | Status: AC
  Filled 2018-01-21: qty 30

## 2018-01-21 MED ORDER — ONDANSETRON HCL 4 MG/2ML IJ SOLN
INTRAMUSCULAR | Status: DC | PRN
  Administered 2018-01-21: 4 mg via INTRAVENOUS

## 2018-01-21 MED ORDER — SODIUM CHLORIDE 0.9% FLUSH: 3.0000 mL | INTRAVENOUS | Status: DC | PRN

## 2018-01-21 MED ORDER — ACETAMINOPHEN 650 MG RE SUPP: 650.0000 mg | RECTAL | Status: DC | PRN

## 2018-01-21 MED ORDER — PHENYLEPHRINE 40 MCG/ML (10ML) SYRINGE FOR IV PUSH (FOR BLOOD PRESSURE SUPPORT)
PREFILLED_SYRINGE | INTRAVENOUS | Status: AC
  Filled 2018-01-21: qty 10

## 2018-01-21 MED ORDER — CIPROFLOXACIN IN D5W 400 MG/200ML IV SOLN
400.0000 mg | INTRAVENOUS | Status: AC
  Administered 2018-01-21: 400 mg via INTRAVENOUS

## 2018-01-21 MED ORDER — MIDAZOLAM HCL 2 MG/2ML IJ SOLN
INTRAMUSCULAR | Status: AC
  Filled 2018-01-21: qty 2

## 2018-01-21 MED ORDER — SUGAMMADEX SODIUM 200 MG/2ML IV SOLN
INTRAVENOUS | Status: DC | PRN
  Administered 2018-01-21: 300 mg via INTRAVENOUS

## 2018-01-21 MED ORDER — PROPOFOL 10 MG/ML IV BOLUS
INTRAVENOUS | Status: DC | PRN
  Administered 2018-01-21: 150 mg via INTRAVENOUS

## 2018-01-21 MED ORDER — LIDOCAINE HCL (CARDIAC) PF 100 MG/5ML IV SOSY
PREFILLED_SYRINGE | INTRAVENOUS | Status: DC | PRN
  Administered 2018-01-21: 50 mg via INTRAVENOUS

## 2018-01-21 MED ORDER — LIDOCAINE 2% (20 MG/ML) 5 ML SYRINGE
INTRAMUSCULAR | Status: AC
  Filled 2018-01-21: qty 5

## 2018-01-21 MED ORDER — ACETAMINOPHEN 325 MG PO TABS: 650.0000 mg | ORAL_TABLET | ORAL | Status: DC | PRN

## 2018-01-21 MED ORDER — LIDOCAINE-EPINEPHRINE (PF) 1 %-1:200000 IJ SOLN
INTRAMUSCULAR | Status: DC | PRN
  Administered 2018-01-21: 15 mL

## 2018-01-21 MED ORDER — SODIUM CHLORIDE 0.9% FLUSH: 3.0000 mL | Freq: Two times a day (BID) | INTRAVENOUS | Status: DC

## 2018-01-21 MED ORDER — EPINEPHRINE 30 MG/30ML IJ SOLN
INTRAMUSCULAR | Status: AC
  Filled 2018-01-21: qty 1

## 2018-01-21 MED ORDER — OXYCODONE HCL 5 MG/5ML PO SOLN: 5.0000 mg | Freq: Once | ORAL | Status: AC | PRN

## 2018-01-21 SURGICAL SUPPLY — 77 items
ADH SKN CLS APL DERMABOND .7 (GAUZE/BANDAGES/DRESSINGS) ×1
BAG DECANTER FOR FLEXI CONT (MISCELLANEOUS) ×1 IMPLANT
BINDER ABDOMINAL  9 SM 30-45 (SOFTGOODS)
BINDER ABDOMINAL 10 UNV 27-48 (MISCELLANEOUS) IMPLANT
BINDER ABDOMINAL 12 SM 30-45 (SOFTGOODS) ×1 IMPLANT
BINDER ABDOMINAL 9 SM 30-45 (SOFTGOODS) IMPLANT
BINDER BREAST LRG (GAUZE/BANDAGES/DRESSINGS) IMPLANT
BINDER BREAST MEDIUM (GAUZE/BANDAGES/DRESSINGS) IMPLANT
BINDER BREAST XLRG (GAUZE/BANDAGES/DRESSINGS) IMPLANT
BINDER BREAST XXLRG (GAUZE/BANDAGES/DRESSINGS) ×1 IMPLANT
BIOPATCH RED 1 DISK 7.0 (GAUZE/BANDAGES/DRESSINGS) IMPLANT
BLADE HEX COATED 2.75 (ELECTRODE) ×2 IMPLANT
BLADE KNIFE PERSONA 10 (BLADE) ×2 IMPLANT
BLADE SURG 15 STRL LF DISP TIS (BLADE) ×1 IMPLANT
BLADE SURG 15 STRL SS (BLADE) ×2
BNDG GAUZE ELAST 4 BULKY (GAUZE/BANDAGES/DRESSINGS) ×2 IMPLANT
CANISTER SUCT 1200ML W/VALVE (MISCELLANEOUS) ×2 IMPLANT
CHLORAPREP W/TINT 26ML (MISCELLANEOUS) ×5 IMPLANT
COVER BACK TABLE 60X90IN (DRAPES) ×2 IMPLANT
COVER MAYO STAND STRL (DRAPES) ×4 IMPLANT
COVER WAND RF STERILE (DRAPES) IMPLANT
DECANTER SPIKE VIAL GLASS SM (MISCELLANEOUS) IMPLANT
DERMABOND ADVANCED (GAUZE/BANDAGES/DRESSINGS) ×1
DERMABOND ADVANCED .7 DNX12 (GAUZE/BANDAGES/DRESSINGS) ×1 IMPLANT
DRAIN CHANNEL 19F RND (DRAIN) IMPLANT
DRAPE LAPAROSCOPIC ABDOMINAL (DRAPES) ×2 IMPLANT
DRSG PAD ABDOMINAL 8X10 ST (GAUZE/BANDAGES/DRESSINGS) ×6 IMPLANT
ELECT BLADE 4.0 EZ CLEAN MEGAD (MISCELLANEOUS)
ELECT REM PT RETURN 9FT ADLT (ELECTROSURGICAL) ×2
ELECTRODE BLDE 4.0 EZ CLN MEGD (MISCELLANEOUS) IMPLANT
ELECTRODE REM PT RTRN 9FT ADLT (ELECTROSURGICAL) ×1 IMPLANT
EVACUATOR SILICONE 100CC (DRAIN) IMPLANT
EXTRACTOR CANIST REVOLVE STRL (CANNISTER) ×2 IMPLANT
GLOVE BIO SURGEON STRL SZ 6.5 (GLOVE) ×7 IMPLANT
GLOVE BIOGEL PI IND STRL 7.0 (GLOVE) IMPLANT
GLOVE BIOGEL PI INDICATOR 7.0 (GLOVE) ×2
GLOVE ECLIPSE 6.5 STRL STRAW (GLOVE) ×1 IMPLANT
GOWN STRL REUS W/ TWL LRG LVL3 (GOWN DISPOSABLE) ×2 IMPLANT
GOWN STRL REUS W/TWL LRG LVL3 (GOWN DISPOSABLE) ×8
IV LACTATED RINGERS 1000ML (IV SOLUTION) ×5 IMPLANT
LINER CANISTER 1000CC FLEX (MISCELLANEOUS) ×4 IMPLANT
NDL HYPO 25X1 1.5 SAFETY (NEEDLE) ×1 IMPLANT
NDL SAFETY ECLIPSE 18X1.5 (NEEDLE) ×1 IMPLANT
NEEDLE HYPO 18GX1.5 SHARP (NEEDLE) ×2
NEEDLE HYPO 25X1 1.5 SAFETY (NEEDLE) ×2 IMPLANT
NS IRRIG 1000ML POUR BTL (IV SOLUTION) ×2 IMPLANT
PACK BASIN DAY SURGERY FS (CUSTOM PROCEDURE TRAY) ×2 IMPLANT
PAD ALCOHOL SWAB (MISCELLANEOUS) ×2 IMPLANT
PENCIL BUTTON HOLSTER BLD 10FT (ELECTRODE) ×2 IMPLANT
SLEEVE SCD COMPRESS KNEE MED (MISCELLANEOUS) ×2 IMPLANT
SPONGE LAP 18X18 RF (DISPOSABLE) ×4 IMPLANT
STRIP SUTURE WOUND CLOSURE 1/2 (SUTURE) ×2 IMPLANT
SUT MNCRL AB 4-0 PS2 18 (SUTURE) ×2 IMPLANT
SUT MON AB 3-0 SH 27 (SUTURE) ×2
SUT MON AB 3-0 SH27 (SUTURE) ×1 IMPLANT
SUT MON AB 5-0 PS2 18 (SUTURE) ×3 IMPLANT
SUT PDS 3-0 CT2 (SUTURE)
SUT PDS AB 2-0 CT2 27 (SUTURE) IMPLANT
SUT PDS II 3-0 CT2 27 ABS (SUTURE) IMPLANT
SUT SILK 3 0 PS 1 (SUTURE) IMPLANT
SUT VIC AB 3-0 SH 27 (SUTURE)
SUT VIC AB 3-0 SH 27X BRD (SUTURE) IMPLANT
SUT VICRYL 4-0 PS2 18IN ABS (SUTURE) IMPLANT
SYR 10ML LL (SYRINGE) ×9 IMPLANT
SYR 3ML 18GX1 1/2 (SYRINGE) IMPLANT
SYR 3ML 23GX1 SAFETY (SYRINGE) ×2 IMPLANT
SYR 50ML LL SCALE MARK (SYRINGE) ×3 IMPLANT
SYR BULB IRRIGATION 50ML (SYRINGE) ×2 IMPLANT
SYR CONTROL 10ML LL (SYRINGE) ×2 IMPLANT
SYR TOOMEY 50ML (SYRINGE) ×4 IMPLANT
TAPE MEASURE VINYL STERILE (MISCELLANEOUS) IMPLANT
TOWEL GREEN STERILE FF (TOWEL DISPOSABLE) ×4 IMPLANT
TUBE CONNECTING 20X1/4 (TUBING) ×2 IMPLANT
TUBING INFILTRATION IT-10001 (TUBING) ×1 IMPLANT
TUBING SET GRADUATE ASPIR 12FT (MISCELLANEOUS) ×2 IMPLANT
UNDERPAD 30X30 (UNDERPADS AND DIAPERS) ×4 IMPLANT
YANKAUER SUCT BULB TIP NO VENT (SUCTIONS) ×2 IMPLANT

## 2018-01-21 NOTE — Op Note (Signed)
DATE OF OPERATION: 01/21/2018  LOCATION: [facility]  PREOPERATIVE DIAGNOSIS: breast asymmetry and contour irregularity after breast reconstruction  POSTOPERATIVE DIAGNOSIS: Same  PROCEDURE: Lipofilling to bilateral breasts  SURGEON: Theodoro Kos Kayloni Rocco, DO  ASSISTANT: Asencion Partridge May, PA, [place] Cox, RNFA  EBL: none  CONDITION: Stable  COMPLICATIONS: None  INDICATION: The patient, [name] [name], is a 52 y.o. female born on September 08, 1965, is here for treatment for breast reconstruction symmetry surgery.  She underwent bilateral mastectomies with reconstruction and has some loss of volume.   PROCEDURE DETAILS:  The patient was seen prior to surgery and marked.  The IV antibiotics were given. The patient was taken to the operating room and given a general anesthetic. A standard time out was performed and all information was confirmed by those in the room. SCDs were placed.   The patients chest and abdomen were prepped and draped.  Local with epi was injected at the umbilical area and lower lateral bikini line.  The #15 blade was used to make a 5 mm incision.  The cannula was inserted and the tumescent (1 liter) infused into the fat.  After waiting for the local and tumescent to take effect the liposuction was used to remove the fat.  The fat was collected in the revolve system.  The fat was then prepared according to the instructions with washing and placing in the syringes. The #15 blade was used to make 4 mm incisions in the previous mastectomy scar for introduction of the cannula. The fat was then placed in the medial upper pole breast area bilaterally the right had 140 cc and the left 90 cc placed.  This showed improved contour and symmetry.  The incisions were closed with the 5-0 Monocryl.  The remaining fat was suctioned into the regular cannister for contouring of the abdomen for 700 cc. Special attention was placed on the right lateral latissimus scar for reduction of  fullness for the dog ear correction. The patient was allowed to wake up and taken to recovery room in stable condition at the end of the case. The family was notified at the end of the case.

## 2018-01-21 NOTE — Anesthesia Preprocedure Evaluation (Signed)
Anesthesia Evaluation  Patient identified by MRN, date of birth, ID band Patient awake    Reviewed: Allergy & Precautions, H&P , NPO status , Patient's Chart, lab work & pertinent test results  Airway Mallampati: II   Neck ROM: full    Dental   Pulmonary neg pulmonary ROS,    breath sounds clear to auscultation       Cardiovascular negative cardio ROS   Rhythm:regular Rate:Normal     Neuro/Psych    GI/Hepatic   Endo/Other  diabetes, Type 2obese  Renal/GU      Musculoskeletal   Abdominal   Peds  Hematology   Anesthesia Other Findings   Reproductive/Obstetrics H/o breast CA                             Anesthesia Physical Anesthesia Plan  ASA: II  Anesthesia Plan: General   Post-op Pain Management:    Induction: Intravenous  PONV Risk Score and Plan: 3 and Ondansetron, Dexamethasone, Midazolam and Treatment may vary due to age or medical condition  Airway Management Planned: Oral ETT  Additional Equipment:   Intra-op Plan:   Post-operative Plan: Extubation in OR  Informed Consent: I have reviewed the patients History and Physical, chart, labs and discussed the procedure including the risks, benefits and alternatives for the proposed anesthesia with the patient or authorized representative who has indicated his/her understanding and acceptance.     Plan Discussed with: CRNA, Anesthesiologist and Surgeon  Anesthesia Plan Comments:         Anesthesia Quick Evaluation

## 2018-01-21 NOTE — Transfer of Care (Signed)
Immediate Anesthesia Transfer of Care Note  Patient: [name] [name]  Procedure(s) Performed: Liposuction from abdomen to bilateral breasts (Bilateral )  Patient Location: PACU  Anesthesia Type:General  Level of Consciousness: awake, alert , oriented and drowsy  Airway & Oxygen Therapy: Patient Spontanous Breathing and Patient connected to face mask oxygen  Post-op Assessment: Report given to RN and Post -op Vital signs reviewed and stable  Post vital signs: Reviewed and stable  Last Vitals:  Vitals Value Taken Time  BP 131/83 01/21/2018  9:32 AM  Temp    Pulse 99 01/21/2018  9:32 AM  Resp 19 01/21/2018  9:32 AM  SpO2 99 % 01/21/2018  9:32 AM  Vitals shown include unvalidated device data.  Last Pain:  Vitals:   01/21/18 0701  TempSrc: Oral  PainSc: 0-No pain      Patients Stated Pain Goal: 2 (62/44/69 5072)  Complications: No apparent anesthesia complications

## 2018-01-21 NOTE — Discharge Instructions (Addendum)
*Took 5mg  Oxycodone at 11:38am*  INSTRUCTIONS FOR AFTER SURGERY   MUST DRINK 8 Oz of water every hour while awake.  This is essential because of the liposuction and fluid shifts your body will have to correct.  You are having surgery.  You will likely have some questions about what to expect following your operation.  The following information will help you and your family understand what to expect when you are discharged from the hospital.  Following these guidelines will help ensure a smooth recovery and reduce risks of complications.   Postoperative instructions include information on: diet, wound care, medications and physical activity.  AFTER SURGERY Expect to go home after the procedure.  In some cases, you may need to spend one night in the hospital for observation.  DIET This surgery does not require a specific diet.  However, I have to mention that the healthier you eat the better your body can start healing. It is important to increasing your protein intake.  This means limiting the foods with sugar and carbohydrates.  Focus on vegetables and some meat.  If you have any liposuction during your procedure be sure to drink water.  If your urine is bright yellow, then it is concentrated, and you need to drink more water.  As a general rule after surgery, you should have 8 ounces of water every hour while awake.  If you find you are persistently nauseated or unable to take in liquids let us know.  NO TOBACCO USE or EXPOSURE.  This will slow your healing process and increase the risk of a wound.  WOUND CARE You can shower the day after surgery if you don't have a drain.  Use fragrance free soap.  Dial, [place] and [place] are usually mild on the skin. If you have a drain clean with baby wipes until the drain is removed.  If you have steri-strips / tape directly attached to your skin leave them in place. It is OK to get these wet.  No baths, pools or hot tubs for two weeks. We close your incision to  leave the smallest and best-looking scar. No ointment or creams on your incisions until given the go ahead.  Especially not Neosporin (Too many skin reactions with this one).  A few weeks after surgery you can use Mederma and start massaging the scar. We ask you to wear your binder or sports bra for the first 6 weeks around the clock, including while sleeping. This provides added comfort and helps reduce the fluid accumulation at the surgery site.  ACTIVITY No heavy lifting until cleared by the doctor.  It is OK to walk and climb stairs. In fact, moving your legs is very important to decrease your risk of a blood clot.  It will also help keep you from getting deconditioned.  Every 1 to 2 hours get up and walk for 5 minutes. This will help with a quicker recovery back to normal.  Let pain be your guide so you don't do too much.  NO, you cannot do the spring cleaning and don't plan on taking care of anyone else.  This is your time for TLC.  You will be more comfortable if you sleep and rest with your head elevated either with a few pillows under you or in a recliner.  No stomach sleeping for a few months.  WORK Everyone returns to work at different times. As a rough guide, most people take at least 1 - 2 weeks off prior  to returning to work. If you need documentation for your job, bring the forms to your postoperative follow up visit.  DRIVING Arrange for someone to bring you home from the hospital.  You may be able to drive a few days after surgery but not while taking any narcotics or valium.  BOWEL MOVEMENTS Constipation can occur after anesthesia and while taking pain medication.  It is important to stay ahead for your comfort.  We recommend taking Milk of Magnesia (2 tablespoons; twice a day) while taking the pain pills.  SEROMA This is fluid your body tried to put in the surgical site.  This is normal but if it creates tight skinny skin let us know.  It usually decreases in a few weeks.  WHEN  TO CALL Call your surgeon's office if any of the following occur:  Fever 101 degrees F or greater  Excessive bleeding or fluid from the incision site.  Pain that increases over time without aid from the medications  Redness, warmth, or pus draining from incision sites  Persistent nausea or inability to take in liquids  Severe misshapen area that underwent the operation.       Post Anesthesia Home Care Instructions  Activity: Get plenty of rest for the remainder of the day. A responsible individual must stay with you for 24 hours following the procedure.  For the next 24 hours, DO NOT: -Drive a car -Paediatric nurse -Drink alcoholic beverages -Take any medication unless instructed by your physician -Make any legal decisions or sign important papers.  Meals: Start with liquid foods such as gelatin or soup. Progress to regular foods as tolerated. Avoid greasy, spicy, heavy foods. If nausea and/or vomiting occur, drink only clear liquids until the nausea and/or vomiting subsides. Call your physician if vomiting continues.  Special Instructions/Symptoms: Your throat may feel dry or sore from the anesthesia or the breathing tube placed in your throat during surgery. If this causes discomfort, gargle with warm salt water. The discomfort should disappear within 24 hours.  If you had a scopolamine patch placed behind your ear for the management of post- operative nausea and/or vomiting:  1. The medication in the patch is effective for 72 hours, after which it should be removed.  Wrap patch in a tissue and discard in the trash. Wash hands thoroughly with soap and water. 2. You may remove the patch earlier than 72 hours if you experience unpleasant side effects which may include dry mouth, dizziness or visual disturbances. 3. Avoid touching the patch. Wash your hands with soap and water after contact with the patch.

## 2018-01-21 NOTE — Interval H&P Note (Signed)
History and Physical Interval Note:  01/21/2018 7:01 AM  [name] [name]  has presented today for surgery, with the diagnosis of Family history of breast cancer  The various methods of treatment have been discussed with the patient and family. After consideration of risks, benefits and other options for treatment, the patient has consented to  Procedure(s): Liposuction to lateral breasts and medial inferior breast (Bilateral) Fat grafting to left breast (will need Revolve) (Left) as a surgical intervention .  The patient's history has been reviewed, patient examined, no change in status, stable for surgery.  I have reviewed the patient's chart and labs.  Questions were answered to the patient's satisfaction.     [name] [name]

## 2018-01-21 NOTE — Progress Notes (Signed)
Pt was placed on bedpan, voided, as soon as I took her off,she requested bedpan again. Placed back on bedpan. Pt c/o pain and dry mouth. Gave pt pain med and then started ice chips. Pt wanted me to remove bedpan second time, ready to remove and she do not take out I am peeing again. Pt wanted coke and saltine crackers. Offered coke - state " I want ice chips!"

## 2018-01-21 NOTE — Anesthesia Postprocedure Evaluation (Signed)
Anesthesia Post Note  Patient: [name] [name]  Procedure(s) Performed: Liposuction from abdomen to bilateral breasts (Bilateral )     Patient location during evaluation: PACU Anesthesia Type: General Level of consciousness: awake and alert Pain management: pain level controlled Vital Signs Assessment: post-procedure vital signs reviewed and stable Respiratory status: spontaneous breathing, nonlabored ventilation, respiratory function stable and patient connected to nasal cannula oxygen Cardiovascular status: blood pressure returned to baseline and stable Postop Assessment: no apparent nausea or vomiting Anesthetic complications: no    Last Vitals:  Vitals:   01/21/18 1015 01/21/18 1030  BP: (!) 141/88 (!) 141/96  Pulse: 86 83  Resp: 13 16  Temp:    SpO2: 95% 95%    Last Pain:  Vitals:   01/21/18 1030  TempSrc:   PainSc: 0-No pain                 Zafira Munos S

## 2018-01-21 NOTE — Anesthesia Procedure Notes (Signed)

## 2018-01-22 ENCOUNTER — Encounter (HOSPITAL_BASED_OUTPATIENT_CLINIC_OR_DEPARTMENT_OTHER): Payer: Self-pay | Admitting: Plastic Surgery

## 2018-01-25 NOTE — Addendum Note (Signed)
Addendum  created 01/25/18 0856 by Tawni Millers, CRNA   Charge Capture section accepted

## 2018-01-28 ENCOUNTER — Telehealth: Payer: Self-pay | Admitting: Hematology and Oncology

## 2018-01-28 ENCOUNTER — Inpatient Hospital Stay: Payer: 59 | Admitting: Hematology and Oncology

## 2018-01-28 NOTE — Telephone Encounter (Signed)
R/s appt per 12/19 sch message - pt is aware of appt date and time

## 2018-01-28 NOTE — Assessment & Plan Note (Signed)
Bilateral mastectomies 07/24/2016  Left mastectomy: ILC grade 2, 1.4 cm, margins negative, 0/2 lymph nodes negative, T1 CN 0 stage IA, ER 95%, PR 0%, HER-2 negative, Ki-67 20% Right mastectomy: Benign  Oncotype DX score 26:17% risk of distant recurrence  Recommendation: 1.Adjuvant chemotherapy with Taxotere and Cytoxan every 3 weeks 4 cycles started 08/25/2016-10/27/2016 2. followed by anastrozole 1 mg daily with ovarian suppression (labs on 08/04/2016 showed FSH21.7 and estradiol less than 2.5)  -------------------------------------------------------------------------- Current treatment: Anastrozole 1 mg daily  Anastrozole toxicities: Feeling of heat in the body but not hot flashes Denies any arthralgias or myalgias.  Breast cancer surveillance: No role of imaging studies because [name] [name] had bilateral mastectomies Recent lipophilic surgery was done  Return to clinic in 1 year for follow-up

## 2018-01-29 ENCOUNTER — Ambulatory Visit (INDEPENDENT_AMBULATORY_CARE_PROVIDER_SITE_OTHER): Payer: 59 | Admitting: Plastic Surgery

## 2018-01-29 ENCOUNTER — Encounter: Payer: Self-pay | Admitting: Plastic Surgery

## 2018-01-29 VITALS — BP 100/70 | HR 100 | Ht 67.5 in | Wt 260.0 lb

## 2018-01-29 DIAGNOSIS — C50912 Malignant neoplasm of unspecified site of left female breast: Secondary | ICD-10-CM | POA: Diagnosis not present

## 2018-01-29 DIAGNOSIS — Z9011 Acquired absence of right breast and nipple: Secondary | ICD-10-CM | POA: Diagnosis not present

## 2018-01-29 DIAGNOSIS — Z9013 Acquired absence of bilateral breasts and nipples: Secondary | ICD-10-CM

## 2018-01-29 DIAGNOSIS — N651 Disproportion of reconstructed breast: Secondary | ICD-10-CM

## 2018-01-29 DIAGNOSIS — Z9889 Other specified postprocedural states: Secondary | ICD-10-CM | POA: Insufficient documentation

## 2018-01-29 DIAGNOSIS — Z9882 Breast implant status: Secondary | ICD-10-CM

## 2018-01-29 NOTE — Progress Notes (Signed)
   Subjective:    Patient ID: [name] [name], female    DOB: [birth year], 52 y.o.   MRN: [id number]  [name] [name] is a 52 year old bf here for follow-up after breast reconstruction bilaterally.  She underwent liposuction and fat saline for improved symmetry and contour.  She had some excess liposuction.  Her abdomen is tender and bruised as expected.  She is very pleased with the feeling and she has in the upper and medial pole of both breasts.  There is no sign of infection.  There appears to be good symmetry.  She is wearing the breast binder and the abdominal binder with the spanks.  The swelling is as expected and will continue to improve over the next couple of weeks.   Review of Systems  Constitutional: Negative.   HENT: Negative.   Eyes: Negative.   Respiratory: Negative.   Cardiovascular: Negative.   Endocrine: Negative.   Genitourinary: Negative.   Skin: Positive for color change.       Objective:   Physical Exam Vitals signs and nursing note reviewed.  Constitutional:      Appearance: Normal appearance.  HENT:     Head: Normocephalic.  Cardiovascular:     Rate and Rhythm: Normal rate.  Abdominal:     Tenderness: There is abdominal tenderness.  Skin:    General: Skin is warm.  Neurological:     General: No focal deficit present.     Mental Status: She is alert.  Psychiatric:        Mood and Affect: Mood normal.        Thought Content: Thought content normal.        Assessment & Plan:  Acquired absence of bilateral breasts and nipples  Breast asymmetry following reconstructive surgery  S/P mastectomy, bilateral  S/P breast reconstruction  Continue abdominal compression with spanks.  She can wear the breast binder as it is comfortable.  For the breast continue with either the breast binder or a sports bra whenever is more comfortable.  Would like to see her back in 2 weeks for reevaluation.

## 2018-02-12 ENCOUNTER — Ambulatory Visit (INDEPENDENT_AMBULATORY_CARE_PROVIDER_SITE_OTHER): Payer: 59 | Admitting: Physician Assistant

## 2018-02-12 ENCOUNTER — Encounter: Payer: Self-pay | Admitting: Physician Assistant

## 2018-02-12 VITALS — BP 161/102 | HR 98 | Temp 98.2°F | Ht 67.5 in | Wt 260.0 lb

## 2018-02-12 DIAGNOSIS — Z9889 Other specified postprocedural states: Secondary | ICD-10-CM

## 2018-02-12 DIAGNOSIS — Z9013 Acquired absence of bilateral breasts and nipples: Secondary | ICD-10-CM

## 2018-02-12 DIAGNOSIS — Z9882 Breast implant status: Secondary | ICD-10-CM

## 2018-02-12 NOTE — Progress Notes (Signed)
  Subjective:     Patient ID: [name] [name], female   DOB: 1965/09/13, 53 y.o.   MRN: [id number]  HPI Very pleasant 53 year old female patient presents the clinic 2 weeks status post lipoma filling of bilateral breasts.  Patient reports overall she is doing well.  She reports just mild discomfort which has been well controlled with anti-inflammatories.  She denies erythema and fevers.  Patient also had some abdominal liposuction at the same time.  She continues to wear an abdominal binder.  She still has mild ecchymosis from such which is improving.  The pt is interested in moving forward with nipple areola tattooing.  Review of Systems  Constitutional: Negative.   Respiratory: Negative.   Cardiovascular: Negative.   Skin:       Mild ecchymosis in areas she received liposuction  Psychiatric/Behavioral: Negative.        Objective:   Physical Exam Constitutional:      Appearance: Normal appearance.  Pulmonary:     Effort: Pulmonary effort is normal.  Abdominal:     Palpations: Abdomen is soft.  Skin:    General: Skin is warm and dry.  Neurological:     Mental Status: She is alert.  Psychiatric:        Mood and Affect: Mood normal.        Behavior: Behavior normal.        Thought Content: Thought content normal.        Judgment: Judgment normal.    No edema or eythema noted No sign of infection Mild ecchymosis of abd because of liposuction    Assessment:     S/p Lipofilling of bilateral breasts and liposcuction of abd    Plan:     Pt may start walking for exercise.  She does still need to refrain from vigorous exercise Encouraged high protein diet Dr. Marla Roe approved the pt moving forward nipple areola tattooing. Bonita and the pt discussed

## 2018-02-16 NOTE — Progress Notes (Signed)
Patient Care Team: Via, Lennette Bihari, MD as PCP - General (Family Medicine) Jovita Kussmaul, MD as Consulting Physician (General Surgery) Nicholas Lose, MD as Consulting Physician (Hematology and Oncology) Gery Pray, MD as Consulting Physician (Radiation Oncology) Rockwell Germany, RN as Registered Nurse Mauro Kaufmann, RN as Registered Nurse Holley Bouche, NP (Inactive) as Nurse Practitioner (Nurse Practitioner) Sylvan Cheese, NP as Nurse Practitioner (Nurse Practitioner)  DIAGNOSIS:    ICD-10-CM   1. 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; ~ 3 cm AP diameter linear clumped NME extends from the medial biopsy cavity posteriorly in the central breast which is suspicious for disease extension    04/26/2014 Clinical Stage    Stage 0: Tis Nx    06/05/2014 Definitive Surgery    Right lumpectomy [name]): ILC, grade 2/3, 0.6 cm, LCIS invasive ca focally present at inferior margin (rt Medial); ILC, grade 2-3, 1.7 cm with neg margins with LCIS (Rt Lateral) ER 100%, PR 99%, Ki 67 6%, HER2/neu neg (ratio 1.14)    06/05/2014 Oncotype testing    Score: 8 (ROR 6%)    06/05/2014 Pathologic Stage    Stage IA: mpT1c pNx    06/23/2014 Surgery    Right breast excision: Benign, 0/3 lymph nodes negative    07/24/2014 - 09/08/2014 Radiation Therapy    Adjuvant RT completed [name]): Right breast 45 Gy over 25  fractions.  Right breast boost 16 Gy over 8 fractions.  Total dose: 60 Gy.      09/25/2014 - 07/24/2016 Anti-estrogen oral therapy    Tamoxifen 20 mg daily (Tekeya Geffert).    11/02/2014 Procedure    Breast/Ovarian panel revealed no clinically significant variant at ATM, BARD1, BRCA1, BRCA2, BRIP1, CDH1, CHEK2, FANCC, MLH1, MSH2, MSH6, NBN, PALB2, PMS2, PTEN, RAD51C, RAD51D, TP53, and XRCC2    11/03/2014 Survivorship    Survivorship visit completed and copy of survivorship care plan provided to patient.    05/07/2016 Breast MRI    Left breast non-mass enhancement spanning 6.9 cm. Small enhancing mass 5.6 cm, no lymph nodes. Right breast normal    05/20/2016 Pathology Results    Left breast biopsy posterior outer: Invasive lobular cancer with LCIS, ER 95%, PR 0%, Ki-67 20%, HER-2 negative ratio 1.25    07/24/2016 Surgery    Left mastectomy: ILC grade 2, 1.4 cm, margins negative, 0/2 lymph nodes negative, T1 CN 0 stage IA, ER 95%, PR 0%, HER-2 negative, Ki-67 20% Right mastectomy: Benign    08/11/2016 Oncotype testing    Oncotype score 26:17% risk of recurrence, high intermediate risk    08/25/2016 - 10/27/2016 Chemotherapy    Taxotere and Cytoxan 4 cycles     11/10/2016 -  Anti-estrogen oral therapy    Anastrozole 1 mg daily     CHIEF COMPLIANT: Follow-up of anastrozole therapy  INTERVAL HISTORY: [name] [name] is a 53 y.o. with above-mentioned history of recurrent  left breast cancer treated with left mastectomy, chemotherapy, and is currently on anti-estrogen therapy with anastrozole. I last saw the patient one year ago. She presents to the clinic alone today. She had her final reconstruction surgery on 01/21/18. She says hot flashes have improved and are mild and occasional. Joint stiffness is only occasional and she takes Claritin for it. 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   REVIEW OF SYSTEMS:   Constitutional: Denies fevers, chills or abnormal weight  loss (+) occasional hot flashes Eyes: Denies blurriness of vision Ears, nose, mouth, throat, and face: Denies mucositis or sore throat Respiratory: Denies cough, dyspnea or wheezes Cardiovascular: Denies palpitation, chest discomfort Gastrointestinal:  Denies nausea, heartburn or change in bowel habits Skin: Denies abnormal skin rashes MSK: (+) occasional joint stiffness Lymphatics: Denies new lymphadenopathy or easy bruising Neurological: Denies numbness, tingling or new weaknesses Behavioral/Psych: Mood is stable, no new changes  Extremities: No lower extremity edema Breast: denies any pain or lumps or nodules in either breasts All other systems were reviewed with the patient and are negative.  I have reviewed the past medical history, past surgical history, social history and family history with the patient and they are unchanged from previous note.  ALLERGIES:  is allergic to penicillins; shellfish allergy; and tetracyclines & related.  MEDICATIONS:  Current Outpatient Medications  Medication Sig Dispense Refill  . anastrozole (ARIMIDEX) 1 MG tablet Take 1 tablet (1 mg total) by mouth daily. 90 tablet 4  . atorvastatin (LIPITOR) 10 MG tablet Take 1 tablet (10 mg total) by mouth daily.    Marland Kitchen glimepiride (AMARYL) 4 MG tablet Take 1 tablet (4 mg total) by mouth daily with breakfast.    . metFORMIN (GLUCOPHAGE) 1000 MG tablet Take 1,000 mg by mouth daily.      No current facility-administered medications for this visit.     PHYSICAL EXAMINATION: ECOG PERFORMANCE STATUS: 1 - Symptomatic but completely ambulatory  Vitals:   02/17/18 1515  BP: (!) 148/96  Pulse: 91  Resp: 18  Temp: 98 F (36.7 C)  SpO2: 100%   Filed Weights   02/17/18 1515  Weight: 252 lb 11.2 oz (114.6 kg)    GENERAL: alert, no distress and comfortable SKIN: skin color, texture, turgor are normal, no rashes or significant lesions EYES: normal, Conjunctiva are pink and non-injected, sclera  clear OROPHARYNX: no exudate, no erythema and lips, buccal mucosa, and tongue normal  NECK: supple, thyroid normal size, non-tender, without nodularity LYMPH:  no palpable lymphadenopathy in the cervical, axillary or inguinal LUNGS: clear to auscultation and percussion with normal breathing effort HEART: regular rate & rhythm and no murmurs and no lower extremity edema ABDOMEN: abdomen soft, non-tender and normal bowel sounds MUSCULOSKELETAL:no cyanosis of digits and no clubbing  NEURO: alert & oriented x 3 with fluent speech, no focal motor/sensory deficits EXTREMITIES: No lower extremity edema  LABORATORY DATA:  I have reviewed the data as listed CMP Latest Ref Rng & Units 01/15/2018 09/09/2017 06/24/2017  Glucose 70 - 99 mg/dL 138(H) 125(H) 141(H)  BUN 6 - 20 mg/dL '10 14 6  ' Creatinine 0.44 - 1.00 mg/dL 0.92 0.81 0.78  Sodium 135 - 145 mmol/L 139 143 144  Potassium 3.5 - 5.1 mmol/L 3.9 4.0 3.8  Chloride 98 - 111 mmol/L 103 105 103  CO2 22 - 32 mmol/L '25 30 30  ' Calcium 8.9 - 10.3 mg/dL 9.1 9.2 8.9  Total Protein 6.4 - 8.3 g/dL - - -  Total Bilirubin 0.20 - 1.20 mg/dL - - -  Alkaline Phos 40 - 150 U/L - - -  AST 5 - 34 U/L - - -  ALT 0 - 55 U/L - - -    Lab Results  Component Value Date   WBC 5.5 09/09/2017   HGB 10.0 (L) 09/09/2017   HCT 33.5 (L) 09/09/2017   MCV 82.1 09/09/2017   PLT 324 09/09/2017   NEUTROABS 3.1 01/26/2017    ASSESSMENT & PLAN:  Breast cancer of upper-outer quadrant of right female breast Bilateral mastectomies 07/24/2016  Left mastectomy: ILC grade 2, 1.4 cm, margins negative, 0/2 lymph nodes negative, T1 CN 0 stage IA, ER 95%, PR 0%, HER-2 negative, Ki-67 20% Right mastectomy: Benign  Oncotype DX score 26:17% risk of distant recurrence  Recommendation: 1.Adjuvant chemotherapy with Taxotere and Cytoxan every 3 weeks 4 cycles started 08/25/2016-10/27/2016 2. followed by anastrozole 1 mg daily with ovarian suppression (labs on 08/04/2016 showed  FSH21.7 and estradiol less than 2.5)  -------------------------------------------------------------------------- Current treatment: Anastrozole 1 mg daily  Anastrozole toxicities: Hot flashes: Very mild Denies any arthralgias or myalgias.  Breast cancer surveillance: No role of imaging studies because she had bilateral mastectomies Recent lipophilic surgery was done  Return to clinic in 1 year for follow-up    No orders of the defined types were placed in this encounter.  The patient has a good understanding of the overall plan. she agrees with it. she will call with any problems that may develop before the next visit here.  Nicholas Lose, MD 02/17/2018   I, Cloyde Reams Dorshimer, am acting as scribe for Nicholas Lose, MD.  I have reviewed the above documentation for accuracy and completeness, and I agree with the above.

## 2018-02-17 ENCOUNTER — Telehealth: Payer: Self-pay | Admitting: Hematology and Oncology

## 2018-02-17 ENCOUNTER — Inpatient Hospital Stay: Payer: 59 | Attending: Hematology and Oncology | Admitting: Hematology and Oncology

## 2018-02-17 DIAGNOSIS — Z923 Personal history of irradiation: Secondary | ICD-10-CM | POA: Diagnosis not present

## 2018-02-17 DIAGNOSIS — R232 Flushing: Secondary | ICD-10-CM

## 2018-02-17 DIAGNOSIS — Z170 Estrogen receptor positive status [ER+]: Secondary | ICD-10-CM | POA: Insufficient documentation

## 2018-02-17 DIAGNOSIS — Z853 Personal history of malignant neoplasm of breast: Secondary | ICD-10-CM | POA: Insufficient documentation

## 2018-02-17 DIAGNOSIS — C50411 Malignant neoplasm of upper-outer quadrant of right female breast: Secondary | ICD-10-CM | POA: Insufficient documentation

## 2018-02-17 DIAGNOSIS — Z7984 Long term (current) use of oral hypoglycemic drugs: Secondary | ICD-10-CM | POA: Insufficient documentation

## 2018-02-17 DIAGNOSIS — Z79811 Long term (current) use of aromatase inhibitors: Secondary | ICD-10-CM | POA: Diagnosis not present

## 2018-02-17 DIAGNOSIS — Z9221 Personal history of antineoplastic chemotherapy: Secondary | ICD-10-CM | POA: Insufficient documentation

## 2018-02-17 DIAGNOSIS — Z79899 Other long term (current) drug therapy: Secondary | ICD-10-CM | POA: Diagnosis not present

## 2018-02-17 DIAGNOSIS — M2560 Stiffness of unspecified joint, not elsewhere classified: Secondary | ICD-10-CM | POA: Diagnosis not present

## 2018-02-17 DIAGNOSIS — E119 Type 2 diabetes mellitus without complications: Secondary | ICD-10-CM | POA: Diagnosis not present

## 2018-02-17 MED ORDER — ANASTROZOLE 1 MG PO TABS: 1.0000 mg | ORAL_TABLET | Freq: Every day | ORAL | 4 refills | Status: DC

## 2018-02-17 NOTE — Assessment & Plan Note (Signed)
Bilateral mastectomies 07/24/2016  Left mastectomy: ILC grade 2, 1.4 cm, margins negative, 0/2 lymph nodes negative, T1 CN 0 stage IA, ER 95%, PR 0%, HER-2 negative, Ki-67 20% Right mastectomy: Benign  Oncotype DX score 26:17% risk of distant recurrence  Recommendation: 1.Adjuvant chemotherapy with Taxotere and Cytoxan every 3 weeks 4 cycles started 08/25/2016-10/27/2016 2. followed by anastrozole 1 mg daily with ovarian suppression (labs on 08/04/2016 showed FSH21.7 and estradiol less than 2.5)  -------------------------------------------------------------------------- Current treatment: Anastrozole 1 mg daily  Anastrozole toxicities: Feeling of heat in the body but not hot flashes Denies any arthralgias or myalgias.  Breast cancer surveillance: No role of imaging studies because she had bilateral mastectomies Recent lipophilic surgery was done  Return to clinic in 1 year for follow-up

## 2018-02-17 NOTE — Telephone Encounter (Signed)
Gave avs and calendar ° °

## 2018-03-25 DIAGNOSIS — Z3009 Encounter for other general counseling and advice on contraception: Secondary | ICD-10-CM | POA: Diagnosis not present

## 2018-03-25 DIAGNOSIS — E1169 Type 2 diabetes mellitus with other specified complication: Secondary | ICD-10-CM | POA: Diagnosis not present

## 2018-03-26 ENCOUNTER — Ambulatory Visit (INDEPENDENT_AMBULATORY_CARE_PROVIDER_SITE_OTHER): Payer: 59 | Admitting: Plastic Surgery

## 2018-03-26 ENCOUNTER — Encounter: Payer: Self-pay | Admitting: Plastic Surgery

## 2018-03-26 VITALS — BP 128/71 | HR 91 | Temp 97.7°F | Ht 67.5 in | Wt 250.0 lb

## 2018-03-26 DIAGNOSIS — Z9013 Acquired absence of bilateral breasts and nipples: Secondary | ICD-10-CM

## 2018-03-26 DIAGNOSIS — Z9889 Other specified postprocedural states: Secondary | ICD-10-CM

## 2018-03-26 DIAGNOSIS — Z9882 Breast implant status: Secondary | ICD-10-CM

## 2018-03-26 NOTE — Progress Notes (Signed)
The patient is a 53 year old female here for follow-up after her bilateral breast reconstruction.  She has expanders exchanged for implants healing very well.  There is no sign of infection.  She has very good symmetry.  She would like to undergo nipple areole a tattoo placement in March.  And we will get that set up.

## 2018-04-07 ENCOUNTER — Other Ambulatory Visit: Payer: Self-pay | Admitting: Family Medicine

## 2018-04-07 ENCOUNTER — Other Ambulatory Visit (HOSPITAL_COMMUNITY)
Admission: RE | Admit: 2018-04-07 | Discharge: 2018-04-07 | Disposition: A | Discharge status: Home / Self Care | Payer: 59 | Source: Ambulatory Visit | Attending: Family Medicine | Admitting: Family Medicine

## 2018-04-07 DIAGNOSIS — Z30432 Encounter for removal of intrauterine contraceptive device: Secondary | ICD-10-CM | POA: Diagnosis not present

## 2018-04-07 DIAGNOSIS — Z124 Encounter for screening for malignant neoplasm of cervix: Secondary | ICD-10-CM | POA: Insufficient documentation

## 2018-04-09 LAB — CYTOLOGY - PAP
Diagnosis: NEGATIVE
HPV: NOT DETECTED

## 2018-06-11 DIAGNOSIS — Z30432 Encounter for removal of intrauterine contraceptive device: Secondary | ICD-10-CM | POA: Diagnosis not present

## 2018-08-26 ENCOUNTER — Other Ambulatory Visit: Payer: Self-pay | Admitting: Adult Health

## 2018-08-26 DIAGNOSIS — C50411 Malignant neoplasm of upper-outer quadrant of right female breast: Secondary | ICD-10-CM

## 2018-08-26 DIAGNOSIS — Z170 Estrogen receptor positive status [ER+]: Secondary | ICD-10-CM

## 2018-10-24 IMAGING — MG 2D DIGITAL DIAGNOSTIC BILATERAL MAMMOGRAM WITH CAD AND ADJUNCT T
8 of 13 series · 8 of 29 positions shown · non-contrast
Comparison: Previous exam(s).

CLINICAL DATA: Patient is status post right breast lumpectomy
[DATE]. Subsequently, patient had additional surgery [DATE] for
right axillary lymph node evaluation as well as reexcision of the
surgical cavity. Pathology from the re-excision demonstrated
negative margins. Lymph nodes were negative for tumor.

EXAM:
2D DIGITAL DIAGNOSTIC BILATERAL MAMMOGRAM WITH CAD AND ADJUNCT TOMO

[R CC (1 of 2)]
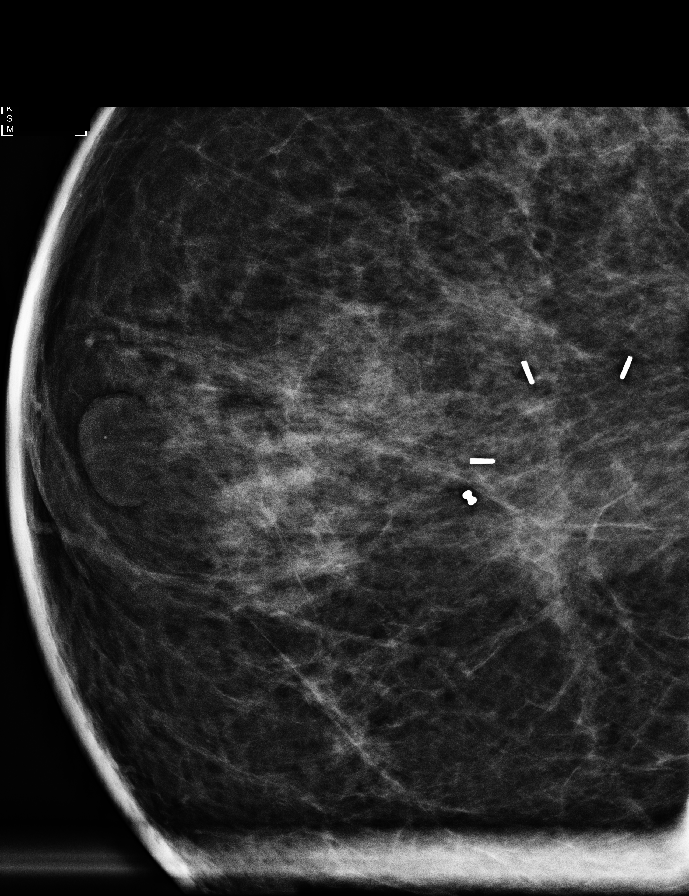

[L MLO synth-2D]
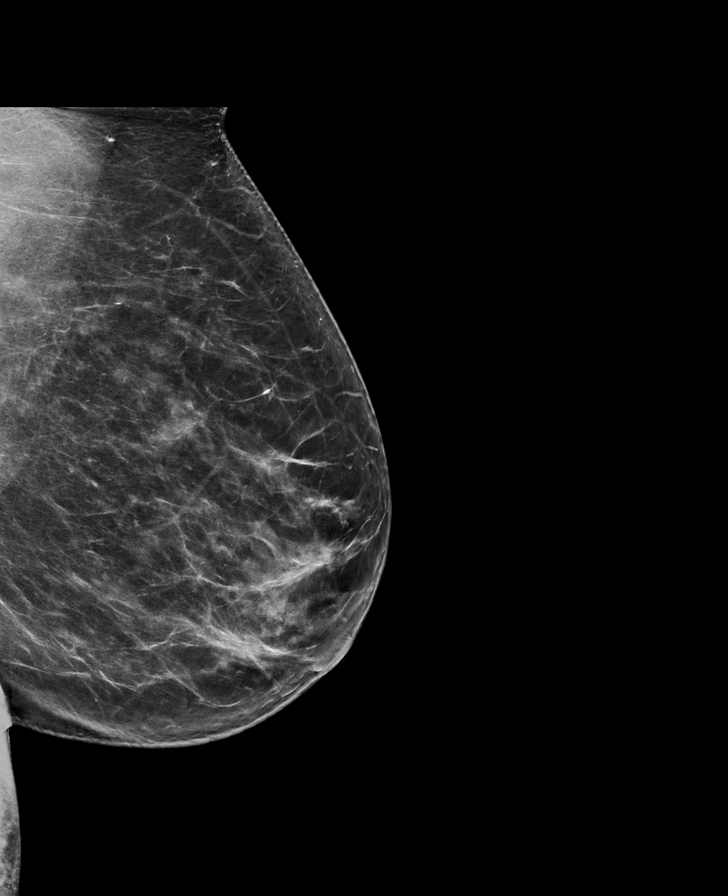

[L MLO]
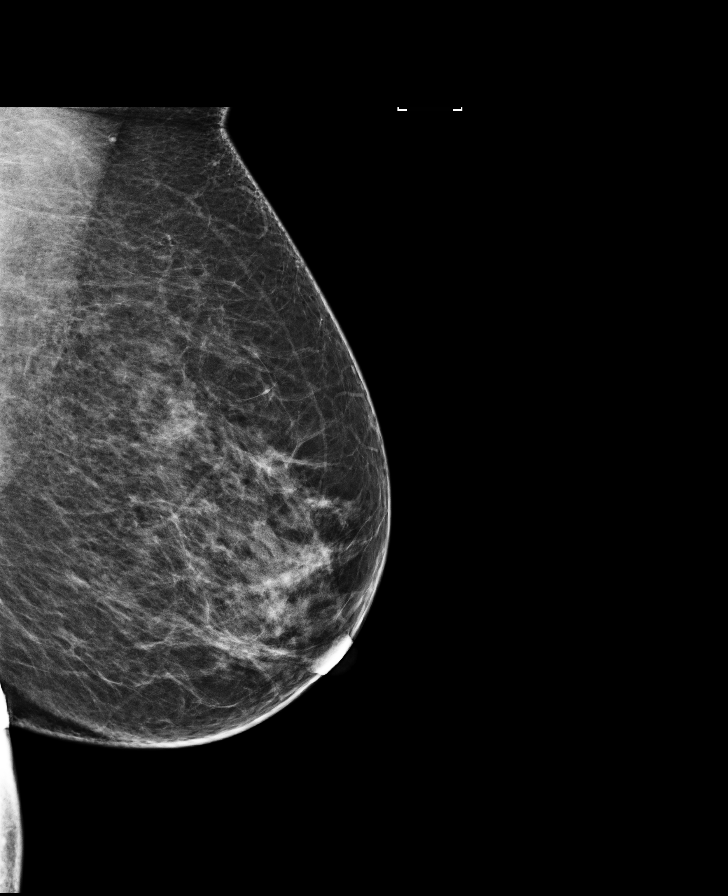

[R MLO synth-2D]
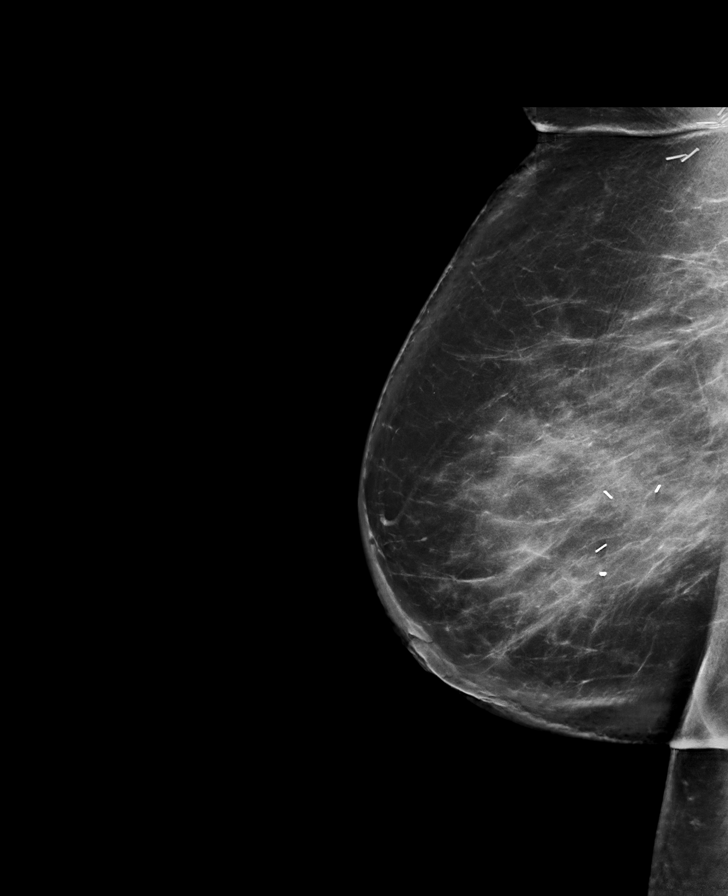

[R CC synth-2D]
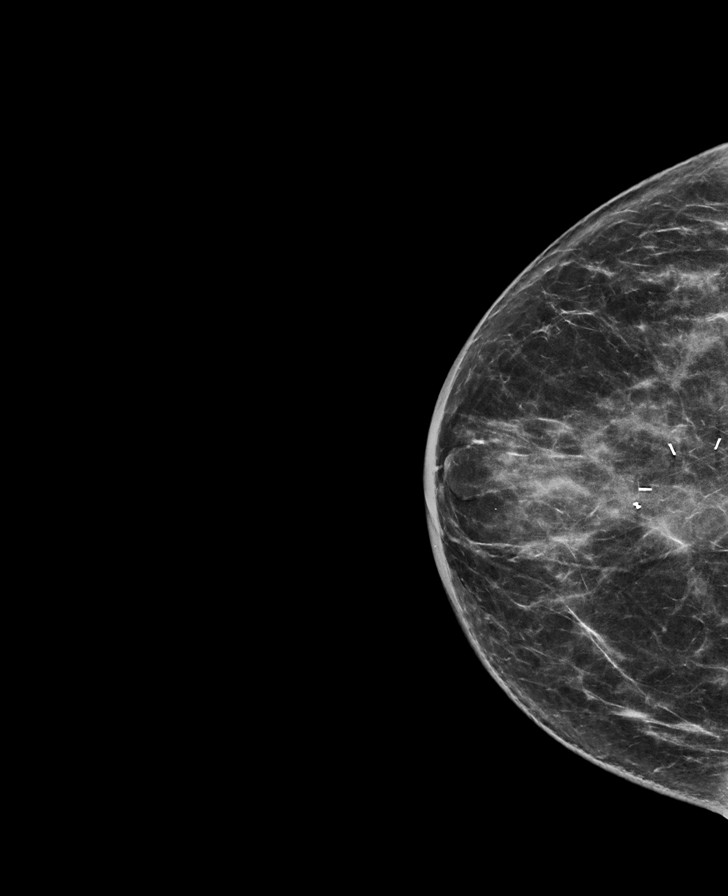

[L CC synth-2D]
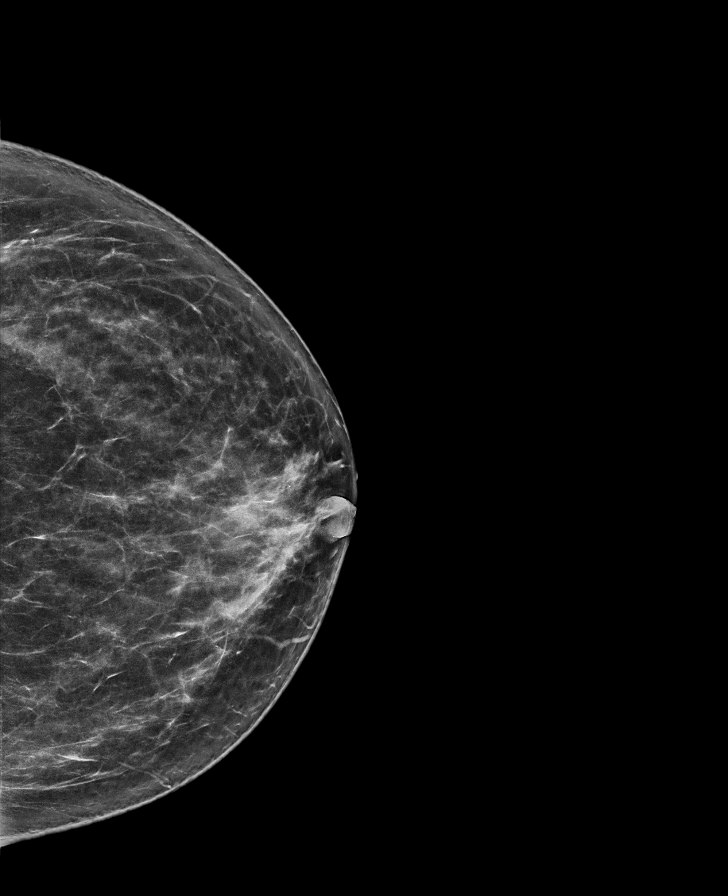

[R MLO]
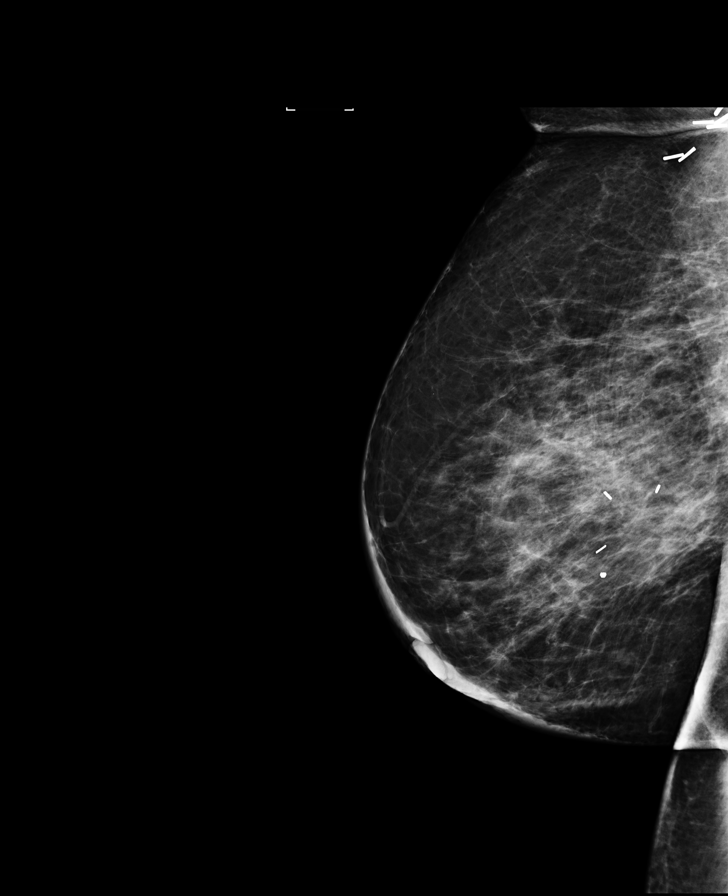

[R CC (2 of 2)]
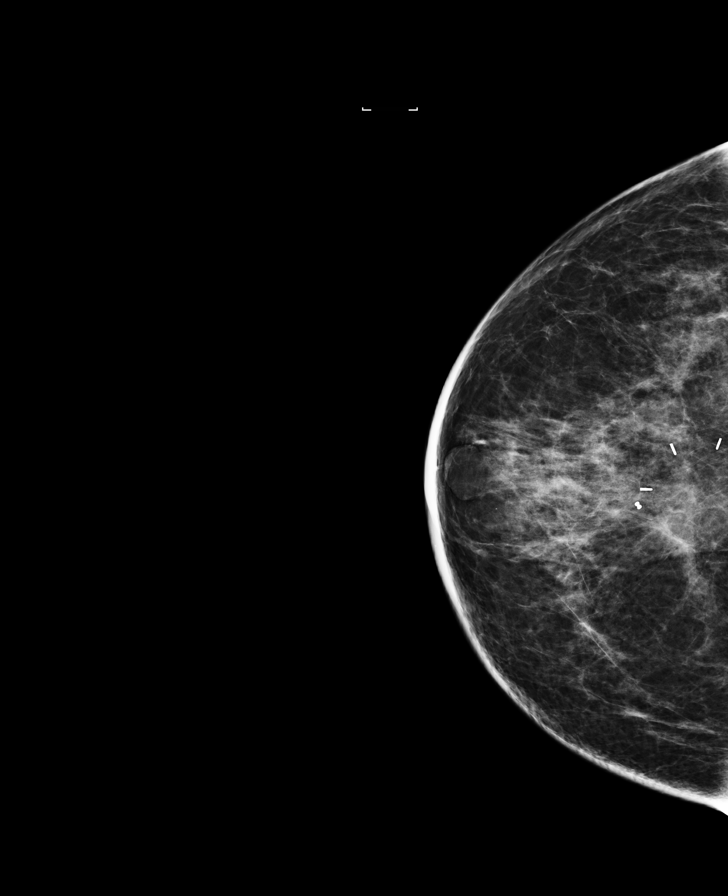

[8 of 29 positions shown; findings below may reference images not displayed]

ACR Breast Density Category c: The breast tissue is heterogeneously
dense, which may obscure small masses.
FINDINGS: Stable postlumpectomy changes right breast. No concerning masses,
calcifications or areas of nonsurgical architectural distortion
identified within either breast. The dumbbell-shaped marking clip is
located along the anteromedial margin of the surgical site. THis is
unchanged from prior exam.

Mammographic images were processed with CAD.
IMPRESSION: Stable postlumpectomy changes right breast.

Dumbbell-shaped marking clip located centrally within the right
breast.

RECOMMENDATION:
Bilateral diagnostic mammography in 12 months.

Consider bilateral breast MRI given dumbbell-shaped marking clip
within the lumpectomy site. This was discussed with Dr. Jaret at time
of interpretation.

I have discussed the findings and recommendations with the patient.
Results were also provided in writing at the conclusion of the
visit. If applicable, a reminder letter will be sent to the patient
regarding the next appointment.

BI-RADS CATEGORY  2: Benign.

## 2019-01-25 ENCOUNTER — Telehealth: Payer: Self-pay | Admitting: Hematology and Oncology

## 2019-01-25 NOTE — Telephone Encounter (Signed)
VG Call Day 1/14 moved f/u to AM. Confirmed with patient.

## 2019-02-15 ENCOUNTER — Telehealth: Payer: Self-pay

## 2019-02-15 NOTE — Telephone Encounter (Signed)
Call to pt to schedule Areola tattoo Pt in office on 02/10/19 for ink/dye test- behind left ear at hairline Pt reports no allergic reaction to ink test- no redness, itching or swelling We have scheduled her for tattoo procedure on 03/04/19 @ 10am She was given paperwork for this procedure-consent/instructions for pre & post procedure She agrees  that her procedure will be performed by me with oversight of my instructor.

## 2019-02-23 NOTE — Progress Notes (Signed)
 Patient Care Team: Brake, Andrew R, FNP as PCP - General (Family Medicine) Toth, Paul III, MD as Consulting Physician (General Surgery) Gudena, Vinay, MD as Consulting Physician (Hematology and Oncology) Kinard, James, MD as Consulting Physician (Radiation Oncology) Martini, Keisha N, RN as Registered Nurse Stuart, Dawn C, RN as Registered Nurse Dawson, Gretchen W, NP (Inactive) as Nurse Practitioner (Nurse Practitioner) Mackey, Heather Thompson, NP as Nurse Practitioner (Nurse Practitioner)  DIAGNOSIS:    ICD-10-CM   1. Malignant neoplasm of upper-outer quadrant of right breast in female, estrogen receptor positive (HCC)  C50.411    Z17.0     SUMMARY OF ONCOLOGIC HISTORY: Oncology History  Breast cancer of upper-outer quadrant of right female breast (HCC)  04/07/2014 Mammogram   Right breast: calcifications    04/14/2014 Initial Biopsy   Right breast needle biopsy: Mammary carcinoma in situ with calcifications at 9 to 10:00 position ER+ (95%), PR+ (66%), DCIS plus LCIS, heterogeneous somewhat E Cadherin positive some negative   04/26/2014 Breast MRI   Biopsy-proven DCIS is identified in the outer quadrant; ~ 3 cm AP diameter linear clumped NME extends from the medial biopsy cavity posteriorly in the central breast which is suspicious for disease extension   04/26/2014 Clinical Stage   Stage 0: Tis Nx   06/05/2014 Definitive Surgery   Right lumpectomy (Toth): ILC, grade 2/3, 0.6 cm, LCIS invasive ca focally present at inferior margin (rt Medial); ILC, grade 2-3, 1.7 cm with neg margins with LCIS (Rt Lateral) ER 100%, PR 99%, Ki 67 6%, HER2/neu neg (ratio 1.14)   06/05/2014 Oncotype testing   Score: 8 (ROR 6%)   06/05/2014 Pathologic Stage   Stage IA: mpT1c pNx   06/23/2014 Surgery   Right breast excision: Benign, 0/3 lymph nodes negative   07/24/2014 - 09/08/2014 Radiation Therapy   Adjuvant RT completed ([facility]): Right breast 45 Gy over 25 fractions.  Right breast boost 16 Gy  over 8 fractions.  Total dose: 60 Gy.     09/25/2014 - 07/24/2016 Anti-estrogen oral therapy   Tamoxifen 20 mg daily (Gudena).   11/02/2014 Procedure   Breast/Ovarian panel revealed no clinically significant variant at ATM, BARD1, BRCA1, BRCA2, BRIP1, CDH1, CHEK2, FANCC, MLH1, MSH2, MSH6, NBN, PALB2, PMS2, PTEN, RAD51C, RAD51D, TP53, and XRCC2   11/03/2014 Survivorship   Survivorship visit completed and copy of survivorship care plan provided to patient.   05/07/2016 Breast MRI   Left breast non-mass enhancement spanning 6.9 cm. Small enhancing mass 5.6 cm, no lymph nodes. Right breast normal   05/20/2016 Pathology Results   Left breast biopsy posterior outer: Invasive lobular cancer with LCIS, ER 95%, PR 0%, Ki-67 20%, HER-2 negative ratio 1.25   07/24/2016 Surgery   Left mastectomy: ILC grade 2, 1.4 cm, margins negative, 0/2 lymph nodes negative, T1 CN 0 stage IA, ER 95%, PR 0%, HER-2 negative, Ki-67 20% Right mastectomy: Benign   08/11/2016 Oncotype testing   Oncotype score 26:17% risk of recurrence, high intermediate risk   08/25/2016 - 10/27/2016 Chemotherapy   Taxotere and Cytoxan 4 cycles    11/10/2016 -  Anti-estrogen oral therapy   Anastrozole 1 mg daily     CHIEF COMPLIANT: Follow-up of left breast cancer on anastrozole therapy  INTERVAL HISTORY: [name] [name] is a 53 y.o. with above-mentioned history of recurrent left breast cancer treated with left mastectomy, chemotherapy, and is currently on anti-estrogen therapy with anastrozole. She presents to the clinic today for annual follow-up.   ALLERGIES:  is allergic to penicillins;   shellfish allergy; and tetracyclines & related.  MEDICATIONS:  Current Outpatient Medications  Medication Sig Dispense Refill  . anastrozole (ARIMIDEX) 1 MG tablet TAKE 1 TABLET BY MOUTH  DAILY 90 tablet 4  . atorvastatin (LIPITOR) 10 MG tablet Take 1 tablet (10 mg total) by mouth daily.    Marland Kitchen glimepiride (AMARYL) 4 MG tablet Take 1 tablet (4 mg  total) by mouth daily with breakfast.    . metFORMIN (GLUCOPHAGE) 1000 MG tablet Take 1,000 mg by mouth daily.      No current facility-administered medications for this visit.    PHYSICAL EXAMINATION: ECOG PERFORMANCE STATUS: 1 - Symptomatic but completely ambulatory  Vitals:   02/24/19 1157  BP: (!) 132/96  Pulse: (!) 107  Resp: 20  Temp: 98.1 F (36.7 C)  SpO2: 99%   Filed Weights   02/24/19 1157  Weight: 263 lb 12.8 oz (119.7 kg)    BREAST: No palpable masses or nodules in either reconstructed breast. No palpable axillary supraclavicular or infraclavicular adenopathy. (exam performed in the presence of a chaperone)  LABORATORY DATA:  I have reviewed the data as listed CMP Latest Ref Rng & Units 01/15/2018 09/09/2017 06/24/2017  Glucose 70 - 99 mg/dL 138(H) 125(H) 141(H)  BUN 6 - 20 mg/dL _0 Creatinine 0.44 - 1.00 mg/dL 0.92 0.81 0.78  Sodium 135 - 145 mmol/L 139 143 144  Potassium 3.5 - 5.1 mmol/L 3.9 4.0 3.8  Chloride 98 - 111 mmol/L 103 105 103  CO2 22 - 32 mmol/L _1 Calcium 8.9 - 10.3 mg/dL 9.1 9.2 8.9  Total Protein 6.4 - 8.3 g/dL - - -  Total Bilirubin 0.20 - 1.20 mg/dL - - -  Alkaline Phos 40 - 150 U/L - - -  AST 5 - 34 U/L - - -  ALT 0 - 55 U/L - - -    Lab Results  Component Value Date   WBC 5.5 09/09/2017   HGB 10.0 (L) 09/09/2017   HCT 33.5 (L) 09/09/2017   MCV 82.1 09/09/2017   PLT 324 09/09/2017   NEUTROABS 3.1 01/26/2017    ASSESSMENT & PLAN:  Breast cancer of upper-outer quadrant of right female breast Bilateral mastectomies 07/24/2016  Left mastectomy: ILC grade 2, 1.4 cm, margins negative, 0/2 lymph nodes negative, T1 CN 0 stage IA, ER 95%, PR 0%, HER-2 negative, Ki-67 20% Right mastectomy: Benign  Oncotype DX score 26:17% risk of distant recurrence  Recommendation: 1.Adjuvant chemotherapy with Taxotere and Cytoxan every 3 weeks 4 cyclesstarted 08/25/2016-10/27/2016 2. followed by anastrozole 1 mg daily (labs on 08/04/2016  showed FSH21.7 and estradiol less than 2.5)  -------------------------------------------------------------------------- Current treatment:Anastrozole 1 mg daily  Anastrozoletoxicities: Hot flashes: Very mild Denies any arthralgias or myalgias.  Breast cancer surveillance: Breast exam: 02/24/2019: No palpable lumps or nodules No role of imaging studies because she had bilateral mastectomies   I encouraged her to lose weight and exercise regularly. Return to clinic in 1 year for follow-up    No orders of the defined types were placed in this encounter.  The patient has a good understanding of the overall plan. she agrees with it. she will call with any problems that may develop before the next visit here.  Total time spent: 15 mins including face to face time and time spent for planning, charting and coordination of care  Nicholas Lose, MD 02/24/2019  I, Cloyde Reams Dorshimer, am acting as scribe for Dr. Nicholas Lose.  I have reviewed the above documentation  for accuracy and completeness, and I agree with the above.

## 2019-02-24 ENCOUNTER — Other Ambulatory Visit: Payer: Self-pay

## 2019-02-24 ENCOUNTER — Ambulatory Visit: Payer: 59 | Admitting: Hematology and Oncology

## 2019-02-24 ENCOUNTER — Inpatient Hospital Stay: Payer: 59 | Attending: Hematology and Oncology | Admitting: Hematology and Oncology

## 2019-02-24 DIAGNOSIS — R232 Flushing: Secondary | ICD-10-CM | POA: Diagnosis not present

## 2019-02-24 DIAGNOSIS — Z880 Allergy status to penicillin: Secondary | ICD-10-CM | POA: Insufficient documentation

## 2019-02-24 DIAGNOSIS — Z170 Estrogen receptor positive status [ER+]: Secondary | ICD-10-CM | POA: Insufficient documentation

## 2019-02-24 DIAGNOSIS — C50411 Malignant neoplasm of upper-outer quadrant of right female breast: Secondary | ICD-10-CM | POA: Insufficient documentation

## 2019-02-24 DIAGNOSIS — Z923 Personal history of irradiation: Secondary | ICD-10-CM | POA: Insufficient documentation

## 2019-02-24 DIAGNOSIS — Z9221 Personal history of antineoplastic chemotherapy: Secondary | ICD-10-CM | POA: Diagnosis not present

## 2019-02-24 DIAGNOSIS — Z9013 Acquired absence of bilateral breasts and nipples: Secondary | ICD-10-CM | POA: Insufficient documentation

## 2019-02-24 DIAGNOSIS — Z79899 Other long term (current) drug therapy: Secondary | ICD-10-CM | POA: Insufficient documentation

## 2019-02-24 DIAGNOSIS — Z79811 Long term (current) use of aromatase inhibitors: Secondary | ICD-10-CM | POA: Insufficient documentation

## 2019-02-24 NOTE — Assessment & Plan Note (Signed)
Bilateral mastectomies 07/24/2016  Left mastectomy: ILC grade 2, 1.4 cm, margins negative, 0/2 lymph nodes negative, T1 CN 0 stage IA, ER 95%, PR 0%, HER-2 negative, Ki-67 20% Right mastectomy: Benign  Oncotype DX score 26:17% risk of distant recurrence  Recommendation: 1.Adjuvant chemotherapy with Taxotere and Cytoxan every 3 weeks 4 cyclesstarted 08/25/2016-10/27/2016 2. followed by anastrozole 1 mg daily (labs on 08/04/2016 showed FSH21.7 and estradiol less than 2.5)  -------------------------------------------------------------------------- Current treatment:Anastrozole 1 mg daily  Anastrozoletoxicities: Hot flashes: Very mild Denies any arthralgias or myalgias.  Breast cancer surveillance: Breast exam: 02/24/2019: No palpable lumps or nodules No role of imaging studies because she had bilateral mastectomies   Return to clinic in 1 year for follow-up

## 2019-02-28 ENCOUNTER — Ambulatory Visit: Payer: 59

## 2019-03-02 IMAGING — DX DG CHEST 1V PORT
1 series · 1 of 1 positions shown · non-contrast
Comparison: 05/08/2016

CLINICAL DATA: Postop insertion for Port-A-Cath.

EXAM:
PORTABLE CHEST 1 VIEW

[chest ap]
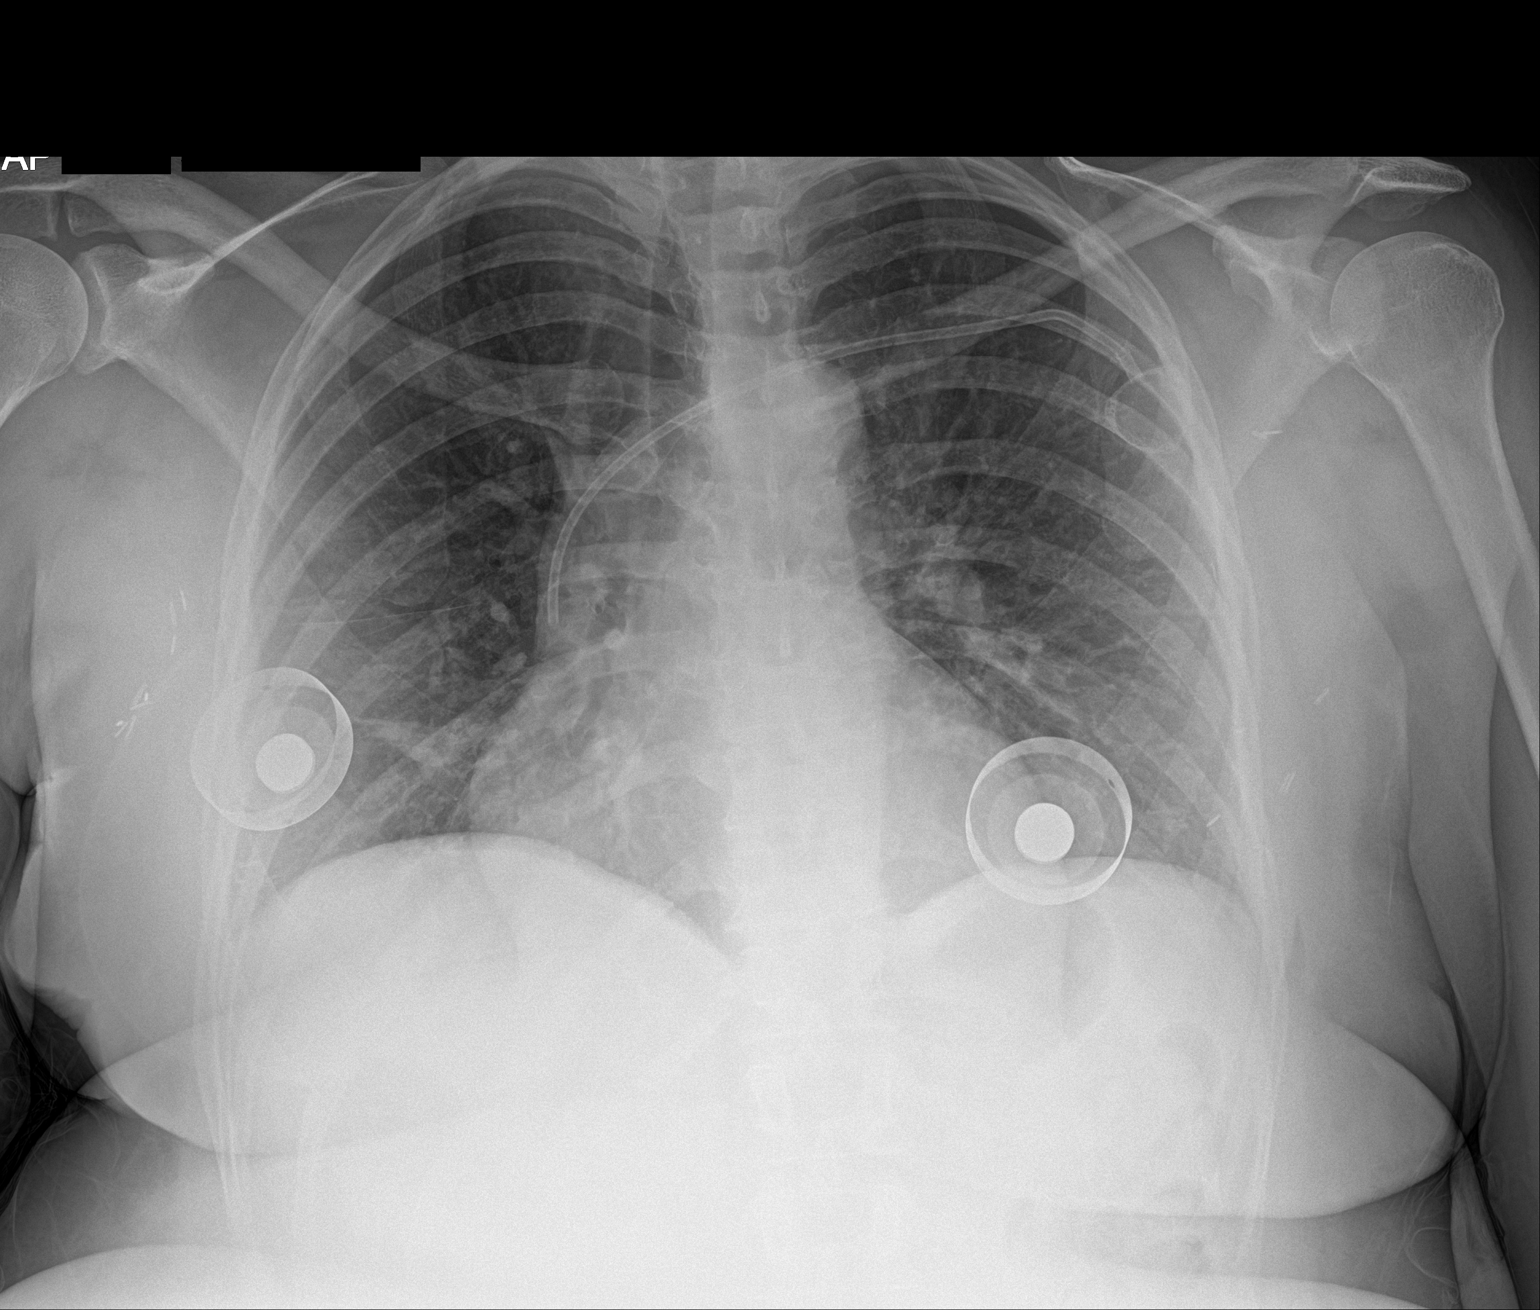

[1 of 1 positions shown; findings below may reference images not displayed]

FINDINGS: 7277 hours. Left Port-A-Cath is new in the interval with tip
overlying the distal SVC. No evidence for pneumothorax. Lung volumes
are low. Cardiopericardial silhouette is at upper limits of normal
for size. Tissue expanders noted anterior chest wall bilaterally.
IMPRESSION: 1. Left Port-A-Cath tip overlies the distal SVC.  No pneumothorax.

## 2019-03-03 ENCOUNTER — Telehealth (INDEPENDENT_AMBULATORY_CARE_PROVIDER_SITE_OTHER): Payer: 59

## 2019-03-03 DIAGNOSIS — Z9013 Acquired absence of bilateral breasts and nipples: Secondary | ICD-10-CM | POA: Diagnosis not present

## 2019-03-04 ENCOUNTER — Other Ambulatory Visit: Payer: Self-pay

## 2019-03-04 ENCOUNTER — Ambulatory Visit: Payer: Self-pay

## 2019-04-22 ENCOUNTER — Other Ambulatory Visit: Payer: Self-pay

## 2019-04-22 ENCOUNTER — Ambulatory Visit (INDEPENDENT_AMBULATORY_CARE_PROVIDER_SITE_OTHER): Payer: 59

## 2019-04-22 VITALS — BP 138/86 | HR 87 | Temp 98.2°F | Wt 260.0 lb

## 2019-04-22 DIAGNOSIS — Z9889 Other specified postprocedural states: Secondary | ICD-10-CM

## 2019-05-19 NOTE — Progress Notes (Signed)
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 Lyndee Leo Sanger   ANESTHESIA:  EMLA  COMPLICATIONS: None.  JUSTIFICATION FOR PROCEDURE:  [name] [name] is a 54 y.o. female with a history of breast cancer status post bilateral breast reconstruction. The patient presents for nipple areolar complex tattoo. Risks, benefits, indications, and alternatives of the above described procedures were discussed with the patient and all the patient's questions were answered.   Pre procedure photos obtained-in chart  DESCRIPTION OF PROCEDURE: After informed consent was obtained and proper identification of patient and surgical site was identified, the patient was taken to the procedure room.  Areola placement/measurements & shape were finalized with pt's approval   Color/shades chosen/approved by pt for areola complex & nipple  The patient was in supine position EMLA cream was removed & area prepped and draped in the usual sterile fashion.    Using REVO: (7) round & (9) straight/medium tattoo head, on the [employer]/Digital Pop Deluxe tattoo handpiece/machine  pigment was instilled to the designed nipple areolar complex, which was confirmed preoperatively with the patient.  "Duration" -topical Anesthesia Liquid applied as needed during the procedure   Once adequate pigment had been applied to the nipple areolar complex, vaseline & gauze dressing was applied post procedure Post procedure photos obtained & in chart   The patient tolerated the procedure well. Pigment used: "World Famous Tattoo Ink" Colors:  Elvera Lennox lot # [id number]/11/2021  [name] lot # [id number] 01/19/2022  Cool Honey lot # [id number] 11/09/2021 Warm Honey lot # [id number] 11/03/2021 Sterile eye wash used to  dilute/mix colors as needed Post care instructions were given to pt & reviewed Pt will call for any concerns She will return for touch-up in 6 weeks

## 2019-05-19 NOTE — Telephone Encounter (Signed)
NIPPLE AREOLAR TATTOO PROCEDURE  PREOPERATIVE DIAGNOSIS:  Acquired absence of bilateral nipple areolar   POSTOPERATIVE DIAGNOSIS: Acquired absence of bilateral nipple areolar    PROCEDURES:  bilateral nipple areolar tattoo   ATTENDING SURGEON: Dr. Lyndee Leo Sanger   ANESTHESIA:  EMLA  COMPLICATIONS: None.  JUSTIFICATION FOR PROCEDURE:  [name] [name] is a 54 y.o. female with a history of breast cancer status post bilateral breast reconstruction. The patient presents for nipple areolar complex tattoo. Risks, benefits, indications, and alternatives of the above described procedures were discussed with the patient and all the patient's questions were answered.   Pre procedure photos obtained-in chart  DESCRIPTION OF PROCEDURE: After informed consent was obtained and proper identification of patient and surgical site was identified, the patient was taken to the procedure room.  Areola placement/measurements & shape were finalized with pt's approval   Color/shades chosen/approved by pt for areola complex & nipple  The patient was in supine position EMLA cream was removed & area prepped and draped in the usual sterile fashion.    Using REVO: (7) round & (9) straight/medium tattoo head, on the [employer]/Digital Pop Deluxe tattoo handpiece/machine  pigment was instilled to the designed nipple areolar complex, which was confirmed preoperatively with the patient.  "Duration" -topical Anesthesia Liquid applied as needed during the procedure   Once adequate pigment had been applied to the nipple areolar complex, vaseline & gauze dressing was applied post procedure Post procedure photos obtained & in chart   The patient tolerated the procedure well. Pigment used: "World Famous Tattoo Ink" Colors:  Elvera Lennox lot # [id number]/11/2021  [name] lot # [id number] 01/19/2022  Cool Honey lot # [id number] 11/09/2021 Warm Honey lot # [id number] 11/03/2021 Sterile eye wash used  to dilute/mix colors as needed Post care instructions were given to pt & reviewed Pt will call for any concerns She will return for touch-up in 6 weeks

## 2019-06-10 ENCOUNTER — Other Ambulatory Visit: Payer: Self-pay

## 2019-08-04 ENCOUNTER — Other Ambulatory Visit: Payer: Self-pay

## 2019-08-04 ENCOUNTER — Ambulatory Visit (INDEPENDENT_AMBULATORY_CARE_PROVIDER_SITE_OTHER): Payer: Self-pay

## 2019-08-04 VITALS — BP 150/97 | HR 92 | Temp 97.9°F

## 2019-08-04 DIAGNOSIS — Z9013 Acquired absence of bilateral breasts and nipples: Secondary | ICD-10-CM

## 2019-08-04 NOTE — Patient Instructions (Signed)
We reviewed the after care instructions: vaseline/gauze dressing-applied  She will call for any concerns F/u in 6 weeks if needed for touch-up

## 2019-08-04 NOTE — Progress Notes (Signed)
NIPPLE AREOLAR TATTOO PROCEDURE  PREOPERATIVE DIAGNOSIS:  Acquired absence of BILATERAL nipple areolar   POSTOPERATIVE DIAGNOSIS: Acquired absence of BILATERAL nipple areolar    PROCEDURES: BILATERAL nipple areolar tattoo   ATTENDING SURGEON: Dr. Lyndee Leo Sanger   ANESTHESIA:  EMLA  COMPLICATIONS: None.  JUSTIFICATION FOR PROCEDURE:  [name] [name] is a 54 y.o. female with a history of breast cancer status post bilateral breast reconstruction.  The patient presents for nipple areolar complex tattoo touch-up.  Risks, benefits, indications, and alternatives of the above described procedures were discussed with the patient and all the patient's questions were answered.  The pt. was brought to the procedure room. Pre-procedure photos were obtained & entered into chart  DESCRIPTION OF PROCEDURE: After informed consent was obtained and proper identification of patient and surgical site was made, the patient was taken to the procedure room and placed supine on the operating room table.  A time out was performed to confirm patient's identity and surgical site.  The patient was prepped and draped in the usual sterile fashion.   Using a #7 tattoo head, pigment was instilled to the designed nipple areolar complex.  Once adequate pigment had been instilled- post procedure photos were obtained & entered into chart.   vaseline and gauze dressing was applied. The patient tolerated the procedure well.  Tattoo Ink: World Famous Ink: Dark Mink- lot# [id number] / exp. 11/02/2021 [name] Skin- lot# [id number] / exp. 09/25/2022 [name]- lot# [id number] / exp. 11/05/2021 Cool Honey- lot# [id number] / exp. 11/09/2021 [name]- lot# [id number] / exp. 01/19/2022 [name]- lot# [id number] / exp. 01/19/2022

## 2019-08-31 ENCOUNTER — Telehealth: Payer: Self-pay | Admitting: Hematology and Oncology

## 2019-08-31 NOTE — Telephone Encounter (Signed)
Rescheduled appointment per 7/1 message. Left message on patient voicemail with updated appointment date and time.

## 2019-09-21 ENCOUNTER — Other Ambulatory Visit: Payer: Self-pay

## 2019-09-21 ENCOUNTER — Ambulatory Visit (INDEPENDENT_AMBULATORY_CARE_PROVIDER_SITE_OTHER): Payer: 59

## 2019-09-21 DIAGNOSIS — Z9013 Acquired absence of bilateral breasts and nipples: Secondary | ICD-10-CM

## 2019-10-09 ENCOUNTER — Other Ambulatory Visit: Payer: Self-pay | Admitting: Adult Health

## 2019-10-09 DIAGNOSIS — C50411 Malignant neoplasm of upper-outer quadrant of right female breast: Secondary | ICD-10-CM

## 2019-10-09 DIAGNOSIS — Z170 Estrogen receptor positive status [ER+]: Secondary | ICD-10-CM

## 2019-10-25 NOTE — Progress Notes (Signed)
NIPPLE AREOLAR TATTOO PROCEDURE  PREOPERATIVE DIAGNOSIS:  Acquired absence of BILATERAL nipple areolar   POSTOPERATIVE DIAGNOSIS: Acquired absence of BILATERAL nipple areolar    PROCEDURES: BILATERAL nipple areolar tattoo   ATTENDING SURGEON: Dr. Lyndee Leo Sanger   ANESTHESIA:  EMLA  COMPLICATIONS: None.  JUSTIFICATION FOR PROCEDURE:  Ms. Meneely is a 54 y.o. female with a history of breast cancer status post bilateral breast reconstruction. The patient presents for nipple areolar complex tattoo touch-up  Risks, benefits, indications, and alternatives of the above described procedures were discussed with the patient and all the patient's questions were answered.  Pre-procedure photos were taken & entered into chart.  DESCRIPTION OF PROCEDURE: After informed consent was obtained and proper identification of patient and surgical site was made, the patient was taken to the procedure room and placed supine on the operating room table.  The patient was prepped and draped in the usual sterile fashion.   Using a #7 needle/ tattoo head, pigment was instilled to the nipple areolar complex bilaterally where the ink had faded.  Once adequate pigment had been applied to the nipple areolar complex, vaseline & gauze dressing was applied.  The patient tolerated the procedure well.  Ink used: World Famous Ink:  Lot # / exp dates- on record [place] Skin Antique Gold Tan Mink [name] Warm Honey Sterile eyewash solution used as needed for dilution/mixing of colors Duration topical anesthesia used as needed. After-care instructions reviewed with pt.

## 2019-10-25 NOTE — Patient Instructions (Signed)
Pt is reminded to use vaseline & gauze dressing as needed F/U in 6 weeks if touch-up is needed She will call for any concerns

## 2020-02-22 ENCOUNTER — Telehealth: Payer: Self-pay | Admitting: Hematology and Oncology

## 2020-02-22 NOTE — Telephone Encounter (Signed)
Patient called and requested to reschedule appointment for March or April. Appointment was rescheduled for 3/24 per request, patient aware.

## 2020-02-23 ENCOUNTER — Inpatient Hospital Stay: Payer: 59 | Admitting: Hematology and Oncology

## 2020-02-24 ENCOUNTER — Ambulatory Visit: Payer: 59 | Admitting: Hematology and Oncology

## 2020-04-04 NOTE — Progress Notes (Unsigned)
Note addendum- for vital signs entered

## 2020-05-02 NOTE — Progress Notes (Signed)
Patient Care Team: Kristen Loader, FNP as PCP - General (Family Medicine) Jovita Kussmaul, MD as Consulting Physician (General Surgery) Nicholas Lose, MD as Consulting Physician (Hematology and Oncology) Gery Pray, MD as Consulting Physician (Radiation Oncology) Rockwell Germany, RN as Registered Nurse Mauro Kaufmann, RN as Registered Nurse Holley Bouche, NP (Inactive) as Nurse Practitioner (Nurse Practitioner) Sylvan Cheese, NP as Nurse Practitioner (Nurse Practitioner)  DIAGNOSIS:    ICD-10-CM   1. Malignant neoplasm of upper-outer quadrant of right breast in female, estrogen receptor positive ([place])  C50.411    Z17.0     SUMMARY OF ONCOLOGIC HISTORY: Oncology History  Breast cancer of upper-outer quadrant of right female breast ([place])  04/07/2014 Mammogram   Right breast: calcifications    04/14/2014 Initial Biopsy   Right breast needle biopsy: Mammary carcinoma in situ with calcifications at 9 to 10:00 position ER+ (95%), PR+ (66%), DCIS plus LCIS, heterogeneous somewhat E Cadherin positive some negative   04/26/2014 Breast MRI   Biopsy-proven DCIS is identified in the outer quadrant; ~ 3 cm AP diameter linear clumped NME extends from the medial biopsy cavity posteriorly in the central breast which is suspicious for disease extension   04/26/2014 Clinical Stage   Stage 0: Tis Nx   06/05/2014 Definitive Surgery   Right lumpectomy [name]): ILC, grade 2/3, 0.6 cm, LCIS invasive ca focally present at inferior margin (rt Medial); ILC, grade 2-3, 1.7 cm with neg margins with LCIS (Rt Lateral) ER 100%, PR 99%, Ki 67 6%, HER2/neu neg (ratio 1.14)   06/05/2014 Oncotype testing   Score: 8 (ROR 6%)   06/05/2014 Pathologic Stage   Stage IA: mpT1c pNx   06/23/2014 Surgery   Right breast excision: Benign, 0/3 lymph nodes negative   07/24/2014 - 09/08/2014 Radiation Therapy   Adjuvant RT completed [name]): Right breast 45 Gy over 25 fractions.  Right breast boost 16 Gy  over 8 fractions.  Total dose: 60 Gy.     09/25/2014 - 07/24/2016 Anti-estrogen oral therapy   Tamoxifen 20 mg daily (Jary Louvier).   11/02/2014 Procedure   Breast/Ovarian panel revealed no clinically significant variant at ATM, BARD1, BRCA1, BRCA2, BRIP1, CDH1, CHEK2, FANCC, MLH1, MSH2, MSH6, NBN, PALB2, PMS2, PTEN, RAD51C, RAD51D, TP53, and XRCC2   11/03/2014 Survivorship   Survivorship visit completed and copy of survivorship care plan provided to patient.   05/07/2016 Breast MRI   Left breast non-mass enhancement spanning 6.9 cm. Small enhancing mass 5.6 cm, no lymph nodes. Right breast normal   05/20/2016 Pathology Results   Left breast biopsy posterior outer: Invasive lobular cancer with LCIS, ER 95%, PR 0%, Ki-67 20%, HER-2 negative ratio 1.25   07/24/2016 Surgery   Left mastectomy: ILC grade 2, 1.4 cm, margins negative, 0/2 lymph nodes negative, T1 CN 0 stage IA, ER 95%, PR 0%, HER-2 negative, Ki-67 20% Right mastectomy: Benign   08/11/2016 Oncotype testing   Oncotype score 26:17% risk of recurrence, high intermediate risk   08/25/2016 - 10/27/2016 Chemotherapy   Taxotere and Cytoxan 4 cycles    11/10/2016 -  Anti-estrogen oral therapy   Anastrozole 1 mg daily     CHIEF COMPLIANT: Follow-up of left breast cancer on anastrozole therapy  INTERVAL HISTORY: [name] [name] is a 54 y.o. with above-mentioned history of recurrent left breast cancer treated with left mastectomy, chemotherapy, and is currently on anti-estrogen therapy with anastrozole. She presents to the clinictoday for annual follow-up.   She is doing extremely well from a  breast cancer surveillance standpoint.  She denies any lumps or nodules.  She noticed a slight swelling underneath the right arm and wanted to come in and be checked out.  ALLERGIES:  is allergic to penicillins, shellfish allergy, and tetracyclines & related.  MEDICATIONS:  Current Outpatient Medications  Medication Sig Dispense Refill  . anastrozole  (ARIMIDEX) 1 MG tablet TAKE 1 TABLET BY MOUTH  DAILY 90 tablet 3  . atorvastatin (LIPITOR) 10 MG tablet Take 1 tablet (10 mg total) by mouth daily.    Marland Kitchen glimepiride (AMARYL) 4 MG tablet Take 1 tablet (4 mg total) by mouth daily with breakfast.    . metFORMIN (GLUCOPHAGE) 1000 MG tablet Take 1,000 mg by mouth daily.      No current facility-administered medications for this visit.    PHYSICAL EXAMINATION: ECOG PERFORMANCE STATUS: 1 - Symptomatic but completely ambulatory  Vitals:   05/03/20 1557  BP: 131/79  Pulse: 95  Resp: 18  Temp: 97.7 F (36.5 C)  SpO2: 97%   Filed Weights   05/03/20 1557  Weight: 267 lb 9.6 oz (121.4 kg)    BREAST: No palpable masses or nodules in either right or left reconstructed breasts.  No palpable axillary lymphadenopathy.  (exam performed in the presence of a chaperone)  LABORATORY DATA:  I have reviewed the data as listed CMP Latest Ref Rng & Units 01/15/2018 09/09/2017 06/24/2017  Glucose 70 - 99 mg/dL 138(H) 125(H) 141(H)  BUN 6 - 20 mg/dL '10 14 6  ' Creatinine 0.44 - 1.00 mg/dL 0.92 0.81 0.78  Sodium 135 - 145 mmol/L 139 143 144  Potassium 3.5 - 5.1 mmol/L 3.9 4.0 3.8  Chloride 98 - 111 mmol/L 103 105 103  CO2 22 - 32 mmol/L '25 30 30  ' Calcium 8.9 - 10.3 mg/dL 9.1 9.2 8.9  Total Protein 6.4 - 8.3 g/dL - - -  Total Bilirubin 0.20 - 1.20 mg/dL - - -  Alkaline Phos 40 - 150 U/L - - -  AST 5 - 34 U/L - - -  ALT 0 - 55 U/L - - -    Lab Results  Component Value Date   WBC 5.5 09/09/2017   HGB 10.0 (L) 09/09/2017   HCT 33.5 (L) 09/09/2017   MCV 82.1 09/09/2017   PLT 324 09/09/2017   NEUTROABS 3.1 01/26/2017    ASSESSMENT & PLAN:  Breast cancer of upper-outer quadrant of right female breast Bilateral mastectomies 07/24/2016  Left mastectomy: ILC grade 2, 1.4 cm, margins negative, 0/2 lymph nodes negative, T1 CN 0 stage IA, ER 95%, PR 0%, HER-2 negative, Ki-67 20% Right mastectomy: Benign  Oncotype DX score 26:17% risk of distant  recurrence  Recommendation: 1.Adjuvant chemotherapy with Taxotere and Cytoxan every 3 weeks 4 cyclesstarted 08/25/2016-10/27/2016 2. followed by anastrozole 1 mg daily (labs on 08/04/2016 showed FSH21.7 and estradiol less than 2.5)  -------------------------------------------------------------------------- Current treatment:Anastrozole 1 mg daily  Anastrozoletoxicities: Hot flashes: Mostly resolved Denies any arthralgias or myalgias.  Breast cancer surveillance: Breast exam: 05/03/20: No palpable lumps or nodules No role of imaging studies because she had bilateral mastectomies     Return to clinic in 1 year for follow-up    No orders of the defined types were placed in this encounter.  The patient has a good understanding of the overall plan. she agrees with it. she will call with any problems that may develop before the next visit here.  Total time spent: 20 mins including face to face time and time spent for  planning, charting and coordination of care  Rulon Eisenmenger, MD, MPH 05/03/2020  I, Molly Dorshimer, am acting as scribe for Dr. Nicholas Lose.  I have reviewed the above documentation for accuracy and completeness, and I agree with the above.

## 2020-05-02 NOTE — Assessment & Plan Note (Signed)
Bilateral mastectomies 07/24/2016  Left mastectomy: ILC grade 2, 1.4 cm, margins negative, 0/2 lymph nodes negative, T1 CN 0 stage IA, ER 95%, PR 0%, HER-2 negative, Ki-67 20% Right mastectomy: Benign  Oncotype DX score 26:17% risk of distant recurrence  Recommendation: 1.Adjuvant chemotherapy with Taxotere and Cytoxan every 3 weeks 4 cyclesstarted 08/25/2016-10/27/2016 2. followed by anastrozole 1 mg daily (labs on 08/04/2016 showed FSH21.7 and estradiol less than 2.5)  -------------------------------------------------------------------------- Current treatment:Anastrozole 1 mg daily  Anastrozoletoxicities: Hot flashes: Very mild Denies any arthralgias or myalgias.  Breast cancer surveillance: Breast exam: 05/03/20: No palpable lumps or nodules No role of imaging studies because she had bilateral mastectomies   I encouraged her to lose weight and exercise regularly. Return to clinic in 1 year for follow-up

## 2020-05-03 ENCOUNTER — Telehealth: Payer: Self-pay | Admitting: Hematology and Oncology

## 2020-05-03 ENCOUNTER — Other Ambulatory Visit: Payer: Self-pay

## 2020-05-03 ENCOUNTER — Inpatient Hospital Stay: Payer: 59 | Attending: Hematology and Oncology | Admitting: Hematology and Oncology

## 2020-05-03 DIAGNOSIS — C50411 Malignant neoplasm of upper-outer quadrant of right female breast: Secondary | ICD-10-CM

## 2020-05-03 DIAGNOSIS — Z79899 Other long term (current) drug therapy: Secondary | ICD-10-CM | POA: Diagnosis not present

## 2020-05-03 DIAGNOSIS — Z170 Estrogen receptor positive status [ER+]: Secondary | ICD-10-CM | POA: Diagnosis not present

## 2020-05-03 DIAGNOSIS — Z9013 Acquired absence of bilateral breasts and nipples: Secondary | ICD-10-CM | POA: Diagnosis not present

## 2020-05-03 DIAGNOSIS — Z79811 Long term (current) use of aromatase inhibitors: Secondary | ICD-10-CM | POA: Insufficient documentation

## 2020-05-03 DIAGNOSIS — Z923 Personal history of irradiation: Secondary | ICD-10-CM | POA: Diagnosis not present

## 2020-05-03 DIAGNOSIS — Z880 Allergy status to penicillin: Secondary | ICD-10-CM | POA: Diagnosis not present

## 2020-05-03 DIAGNOSIS — R232 Flushing: Secondary | ICD-10-CM | POA: Diagnosis not present

## 2020-05-03 NOTE — Telephone Encounter (Signed)
Scheduled appt per 3/24 los. Pt aware.  

## 2020-08-26 ENCOUNTER — Other Ambulatory Visit: Payer: Self-pay | Admitting: Adult Health

## 2020-08-26 DIAGNOSIS — C50411 Malignant neoplasm of upper-outer quadrant of right female breast: Secondary | ICD-10-CM

## 2020-08-27 ENCOUNTER — Encounter: Payer: Self-pay | Admitting: Hematology and Oncology

## 2021-02-15 ENCOUNTER — Other Ambulatory Visit: Payer: Self-pay | Admitting: Family Medicine

## 2021-02-15 ENCOUNTER — Other Ambulatory Visit: Payer: Self-pay

## 2021-02-15 ENCOUNTER — Ambulatory Visit
Admission: RE | Admit: 2021-02-15 | Discharge: 2021-02-15 | Disposition: A | Discharge status: Home / Self Care | Payer: 59 | Source: Ambulatory Visit | Attending: Family Medicine | Admitting: Family Medicine

## 2021-02-15 DIAGNOSIS — M25562 Pain in left knee: Secondary | ICD-10-CM

## 2021-05-09 ENCOUNTER — Ambulatory Visit: Payer: 59 | Admitting: Hematology and Oncology

## 2021-06-07 NOTE — Progress Notes (Incomplete)
? ?Patient Care Team: ?Kristen Loader, FNP as PCP - General (Family Medicine) ?Jovita Kussmaul, MD as Consulting Physician (General Surgery) ?Nicholas Lose, MD as Consulting Physician (Hematology and Oncology) ?Gery Pray, MD as Consulting Physician (Radiation Oncology) ?Rockwell Germany, RN as Registered Nurse ?Mauro Kaufmann, RN as Registered Nurse ?Holley Bouche, NP (Inactive) as Nurse Practitioner (Nurse Practitioner) ?Sylvan Cheese, NP as Nurse Practitioner (Nurse Practitioner) ? ?DIAGNOSIS: No diagnosis found. ? ?SUMMARY OF ONCOLOGIC HISTORY: ?Oncology History  ?Breast cancer of upper-outer quadrant of right female breast ([place])  ?04/07/2014 Mammogram  ? Right breast: calcifications  ? ?  ?04/14/2014 Initial Biopsy  ? Right breast needle biopsy: Mammary carcinoma in situ with calcifications at 9 to 10:00 position ER+ (95%), PR+ (66%), DCIS plus LCIS, heterogeneous somewhat E Cadherin positive some negative ? ?  ?04/26/2014 Breast MRI  ? Biopsy-proven DCIS is identified in the outer quadrant; ~ 3 cm AP diameter linear clumped NME extends from the medial biopsy cavity posteriorly in the central breast which is suspicious for disease extension ? ?  ?04/26/2014 Clinical Stage  ? Stage 0: Tis Nx ? ?  ?06/05/2014 Definitive Surgery  ? Right lumpectomy [name]): ILC, grade 2/3, 0.6 cm, LCIS invasive ca focally present at inferior margin (rt Medial); ILC, grade 2-3, 1.7 cm with neg margins with LCIS (Rt Lateral) ER 100%, PR 99%, Ki 67 6%, HER2/neu neg (ratio 1.14) ? ?  ?06/05/2014 Oncotype testing  ? Score: 8 (ROR 6%) ? ?  ?06/05/2014 Pathologic Stage  ? Stage IA: mpT1c pNx ? ?  ?06/23/2014 Surgery  ? Right breast excision: Benign, 0/3 lymph nodes negative ? ?  ?07/24/2014 - 09/08/2014 Radiation Therapy  ? Adjuvant RT completed [name]): Right breast 45 Gy over 25 fractions.  Right breast boost 16 Gy over 8 fractions.  Total dose: 60 Gy.   ? ?  ?09/25/2014 - 07/24/2016 Anti-estrogen oral therapy  ? Tamoxifen  20 mg daily ([place]). ?  ?11/02/2014 Procedure  ? Breast/Ovarian panel revealed no clinically significant variant at ATM, BARD1, BRCA1, BRCA2, BRIP1, CDH1, CHEK2, FANCC, MLH1, MSH2, MSH6, NBN, PALB2, PMS2, PTEN, RAD51C, RAD51D, TP53, and XRCC2 ? ?  ?11/03/2014 Survivorship  ? Survivorship visit completed and copy of survivorship care plan provided to patient. ? ?  ?05/07/2016 Breast MRI  ? Left breast non-mass enhancement spanning 6.9 cm. Small enhancing mass 5.6 cm, no lymph nodes. Right breast normal ? ?  ?05/20/2016 Pathology Results  ? Left breast biopsy posterior outer: Invasive lobular cancer with LCIS, ER 95%, PR 0%, Ki-67 20%, HER-2 negative ratio 1.25 ? ?  ?07/24/2016 Surgery  ? Left mastectomy: ILC grade 2, 1.4 cm, margins negative, 0/2 lymph nodes negative, T1 CN 0 stage IA, ER 95%, PR 0%, HER-2 negative, Ki-67 20% Right mastectomy: Benign ? ?  ?08/11/2016 Oncotype testing  ? Oncotype score 26:17% risk of recurrence, high intermediate risk ? ?  ?08/25/2016 - 10/27/2016 Chemotherapy  ? Taxotere and Cytoxan ?4 cycles ? ? ?  ?11/10/2016 -  Anti-estrogen oral therapy  ? Anastrozole 1 mg daily ? ?  ? ? ?CHIEF COMPLIANT: Follow-up of left breast cancer on anastrozole therapy ?  ? ?INTERVAL HISTORY: [name] [name] is a 56 y.o. with above-mentioned history of recurrent left breast cancer treated with left mastectomy, chemotherapy, and is currently on anti-estrogen therapy with anastrozole. She presents to the clinic today for annual follow-up.   ? ? ?ALLERGIES:  is allergic to penicillins, shellfish allergy, and tetracyclines & related. ? ?  MEDICATIONS:  ?Current Outpatient Medications  ?Medication Sig Dispense Refill  ? anastrozole (ARIMIDEX) 1 MG tablet TAKE 1 TABLET BY MOUTH  DAILY 90 tablet 3  ? atorvastatin (LIPITOR) 10 MG tablet Take 1 tablet (10 mg total) by mouth daily.    ? glimepiride (AMARYL) 4 MG tablet Take 1 tablet (4 mg total) by mouth daily with breakfast.    ? metFORMIN (GLUCOPHAGE) 1000 MG tablet Take  1,000 mg by mouth daily.     ? ?No current facility-administered medications for this visit.  ? ? ?PHYSICAL EXAMINATION: ?ECOG PERFORMANCE STATUS: {CHL ONC ECOG IQ:[id number]} ? ?There were no vitals filed for this visit. ?There were no vitals filed for this visit. ? ?BREAST:*** No palpable masses or nodules in either right or left breasts. No palpable axillary supraclavicular or infraclavicular adenopathy no breast tenderness or nipple discharge. (exam performed in the presence of a chaperone) ? ?LABORATORY DATA:  ?I have reviewed the data as listed ? ?  Latest Ref Rng & Units 01/15/2018  ?  4:00 PM 09/09/2017  ?  3:39 PM 06/24/2017  ?  5:07 PM  ?CMP  ?Glucose 70 - 99 mg/dL 138   125   141    ?BUN 6 - 20 mg/dL '10   14   6    ' ?Creatinine 0.44 - 1.00 mg/dL 0.92   0.81   0.78    ?Sodium 135 - 145 mmol/L 139   143   144    ?Potassium 3.5 - 5.1 mmol/L 3.9   4.0   3.8    ?Chloride 98 - 111 mmol/L 103   105   103    ?CO2 22 - 32 mmol/L '25   30   30    ' ?Calcium 8.9 - 10.3 mg/dL 9.1   9.2   8.9    ? ? ?Lab Results  ?Component Value Date  ? WBC 5.5 09/09/2017  ? HGB 10.0 (L) 09/09/2017  ? HCT 33.5 (L) 09/09/2017  ? MCV 82.1 09/09/2017  ? PLT 324 09/09/2017  ? NEUTROABS 3.1 01/26/2017  ? ? ?ASSESSMENT & PLAN:  ?No problem-specific Assessment & Plan notes found for this encounter. ? ? ? ?No orders of the defined types were placed in this encounter. ? ?The patient has a good understanding of the overall plan. she agrees with it. she will call with any problems that may develop before the next visit here. ?Total time spent: 30 mins including face to face time and time spent for planning, charting and co-ordination of care ? ? Suzzette Righter, CMA ?06/07/21 ? ? ? I Gardiner Coins am scribing for Dr. Lindi Adie ? ?***  ?

## 2021-06-21 ENCOUNTER — Ambulatory Visit: Payer: 59 | Admitting: Hematology and Oncology

## 2021-07-05 ENCOUNTER — Encounter (HOSPITAL_BASED_OUTPATIENT_CLINIC_OR_DEPARTMENT_OTHER): Payer: Self-pay | Admitting: Emergency Medicine

## 2021-07-05 ENCOUNTER — Emergency Department (HOSPITAL_BASED_OUTPATIENT_CLINIC_OR_DEPARTMENT_OTHER)
Admission: EM | Admit: 2021-07-05 | Discharge: 2021-07-05 | Disposition: A | Discharge status: Home / Self Care | Payer: 59 | Attending: Emergency Medicine | Admitting: Emergency Medicine

## 2021-07-05 DIAGNOSIS — E119 Type 2 diabetes mellitus without complications: Secondary | ICD-10-CM | POA: Insufficient documentation

## 2021-07-05 DIAGNOSIS — R21 Rash and other nonspecific skin eruption: Secondary | ICD-10-CM | POA: Insufficient documentation

## 2021-07-05 DIAGNOSIS — Z7984 Long term (current) use of oral hypoglycemic drugs: Secondary | ICD-10-CM | POA: Insufficient documentation

## 2021-07-05 MED ORDER — DIPHENHYDRAMINE HCL 25 MG PO TABS: 25.0000 mg | ORAL_TABLET | Freq: Four times a day (QID) | ORAL | 0 refills | Status: AC | PRN

## 2021-07-05 MED ORDER — PERMETHRIN 5 % EX CREA: TOPICAL_CREAM | CUTANEOUS | 0 refills | Status: AC

## 2021-07-05 MED ORDER — HYDROCORTISONE 1 % EX CREA: TOPICAL_CREAM | CUTANEOUS | 0 refills | Status: AC

## 2021-07-05 NOTE — ED Triage Notes (Signed)
Pt arrives pov, steady gait c/o rash to torso and bilaterally to arms and legs. Treated at [facility] for bedbugs x 2 weeks pta, but endorses rash continues

## 2021-07-05 NOTE — Discharge Instructions (Signed)
Use the cream as prescribed for possible scabies versus bedbugs.  Use the steroid cream as needed for itching.  You may use Benadryl as needed for itching as well.  Return to the ED with difficulty breathing, chest pain, tongue or lip swelling, other concerns.

## 2021-07-05 NOTE — ED Provider Notes (Signed)
[facility] Provider Note   CSN: [id number] Arrival date & time: 07/05/21  1826     History  Chief Complaint  Patient presents with   Rash    [name] [name] is a 56 y.o. female.  Patient reports breaking out in itchy rash for the past 2 weeks.  Husband with similar symptoms.  She was seen by her PCP at [place] who thought she could have bedbugs and treated her with triamcinolone which she is now out of.  She describes itching to her bilateral arms, chest, back and neck area.  No difficulty breathing or difficulty swallowing.  No tongue or lip swelling.  She did change out her mattress and bedding and called an exterminator.  Did not see any bedbugs however. Has also been using a new laundry detergent.  No other new exposures. Denies having any pets and denies any possibility of fleas.  Denies any cabin trips or tick bites.  Denies seeing any bugs in her home. No itching to her palms or soles.  No itching to her genitalia. She is having new lesions to her neck as well as between her breasts  The history is provided by the patient.  Rash Associated symptoms: no abdominal pain, no fever, no headaches, no joint pain, no myalgias, no nausea, no shortness of breath and not vomiting       Home Medications Prior to Admission medications   Medication Sig Start Date End Date Taking? Authorizing Provider  anastrozole (ARIMIDEX) 1 MG tablet TAKE 1 TABLET BY MOUTH  DAILY 08/27/20   Gardenia Phlegm, NP  atorvastatin (LIPITOR) 10 MG tablet Take 1 tablet (10 mg total) by mouth daily. 06/05/16   Nicholas Lose, MD  glimepiride (AMARYL) 4 MG tablet Take 1 tablet (4 mg total) by mouth daily with breakfast. 06/05/16   Nicholas Lose, MD  metFORMIN (GLUCOPHAGE) 1000 MG tablet Take 1,000 mg by mouth daily.     [provider]      Allergies    Penicillins, Shellfish allergy, and Tetracyclines & related    Review of Systems   Review of  Systems  Constitutional:  Negative for activity change, appetite change and fever.  HENT:  Negative for congestion and rhinorrhea.   Respiratory:  Negative for cough, chest tightness and shortness of breath.   Cardiovascular:  Negative for chest pain.  Gastrointestinal:  Negative for abdominal pain, nausea and vomiting.  Genitourinary:  Negative for dysuria and hematuria.  Musculoskeletal:  Negative for arthralgias and myalgias.  Skin:  Positive for rash.  Neurological:  Negative for weakness and headaches.   all other systems are negative except as noted in the HPI and PMH.   Physical Exam Updated Vital Signs BP (!) 150/101 (BP Location: Left Wrist)   Pulse 92   Temp 97.6 F (36.4 C) (Oral)   Resp 18   Ht '5\' 7"'$  (1.702 m)   Wt 115.7 kg   LMP 10/06/2014   SpO2 100%   BMI 39.94 kg/m  Physical Exam Vitals and nursing note reviewed.  Constitutional:      General: She is not in acute distress.    Appearance: She is well-developed.  HENT:     Head: Normocephalic and atraumatic.     Mouth/Throat:     Pharynx: No oropharyngeal exudate.     Comments: No tongue or lip swelling Eyes:     Conjunctiva/sclera: Conjunctivae normal.     Pupils: Pupils are equal, round, and reactive to  light.  Neck:     Comments: No meningismus. Cardiovascular:     Rate and Rhythm: Normal rate and regular rhythm.     Heart sounds: Normal heart sounds. No murmur heard. Pulmonary:     Effort: Pulmonary effort is normal. No respiratory distress.     Breath sounds: Normal breath sounds.  Chest:     Chest Ramiro: No tenderness.  Abdominal:     Palpations: Abdomen is soft.     Tenderness: There is no abdominal tenderness. There is no guarding or rebound.  Musculoskeletal:        General: No tenderness. Normal range of motion.     Cervical back: Normal range of motion and neck supple.  Skin:    General: Skin is warm.     Findings: Rash present.     Comments: Scattered macular papular lesions to her  back, bilateral arms, anterior neck, chest [place].  Neurological:     Mental Status: She is alert and oriented to person, place, and time.     Cranial Nerves: No cranial nerve deficit.     Motor: No abnormal muscle tone.     Coordination: Coordination normal.     Comments:  5/5 strength throughout. CN 2-12 intact.Equal grip strength.   Psychiatric:        Behavior: Behavior normal.    ED Results / Procedures / Treatments   Labs (all labs ordered are listed, but only abnormal results are displayed) Labs Reviewed - No data to display  EKG None  Radiology No results found.  Procedures Procedures    Medications Ordered in ED Medications - No data to display  ED Course/ Medical Decision Making/ A&P                           Medical Decision Making Amount and/or Complexity of Data Reviewed Labs: ordered. Decision-making details documented in ED Course. Radiology: ordered and independent interpretation performed. Decision-making details documented in ED Course. ECG/medicine tests: ordered and independent interpretation performed. Decision-making details documented in ED Course.  Risk OTC drugs. Prescription drug management.   2 weeks of rash with possible exposure to some type of biting insects.  No difficulty breathing or difficulty swallowing.  No chest pain or shortness of breath.  Consider scabies, bedbugs, fleas, other allergic reaction.  No evidence of anaphylaxis.  Discussed permethrin cream, topical steroids, antihistamines.  Patient is diabetic and would like to avoid p.o. steroids and possible hyperglycemia  Discussed with patient to wash all of her clothing and bedding.  Follow-up with PCP as well as return to the ED with worsening symptoms including difficulty breathing, chest pain, tongue or lip swelling or any other concerns.        Final Clinical Impression(s) / ED Diagnoses Final diagnoses:  Skin eruption    Rx / DC Orders ED Discharge Orders      None         Weiland Tomich, Annie Main, MD 07/05/21 2306

## 2021-07-31 ENCOUNTER — Other Ambulatory Visit: Payer: Self-pay | Admitting: Adult Health

## 2021-07-31 DIAGNOSIS — C50411 Malignant neoplasm of upper-outer quadrant of right female breast: Secondary | ICD-10-CM

## 2021-08-01 ENCOUNTER — Encounter: Payer: Self-pay | Admitting: Hematology and Oncology

## 2021-08-14 ENCOUNTER — Telehealth: Payer: Self-pay | Admitting: Hematology and Oncology

## 2021-08-14 NOTE — Telephone Encounter (Signed)
Per 7/5 phone lines pt called and asked to reschedule her appointment.  Date and time were picked by pt

## 2021-08-16 ENCOUNTER — Inpatient Hospital Stay: Payer: 59 | Admitting: Hematology and Oncology

## 2021-11-14 ENCOUNTER — Ambulatory Visit: Payer: 59 | Admitting: Hematology and Oncology

## 2021-11-19 ENCOUNTER — Ambulatory Visit: Payer: 59 | Admitting: Internal Medicine

## 2021-11-28 ENCOUNTER — Ambulatory Visit: Payer: 59 | Admitting: Hematology and Oncology

## 2021-11-29 ENCOUNTER — Emergency Department (HOSPITAL_BASED_OUTPATIENT_CLINIC_OR_DEPARTMENT_OTHER)
Admission: EM | Admit: 2021-11-29 | Discharge: 2021-11-30 | Disposition: A | Discharge status: Home / Self Care | Payer: 59 | Attending: Emergency Medicine | Admitting: Emergency Medicine

## 2021-11-29 ENCOUNTER — Encounter (HOSPITAL_BASED_OUTPATIENT_CLINIC_OR_DEPARTMENT_OTHER): Payer: Self-pay | Admitting: Urology

## 2021-11-29 ENCOUNTER — Other Ambulatory Visit: Payer: Self-pay

## 2021-11-29 DIAGNOSIS — E119 Type 2 diabetes mellitus without complications: Secondary | ICD-10-CM | POA: Insufficient documentation

## 2021-11-29 DIAGNOSIS — Z853 Personal history of malignant neoplasm of breast: Secondary | ICD-10-CM | POA: Diagnosis not present

## 2021-11-29 DIAGNOSIS — Z79899 Other long term (current) drug therapy: Secondary | ICD-10-CM | POA: Diagnosis not present

## 2021-11-29 DIAGNOSIS — I10 Essential (primary) hypertension: Secondary | ICD-10-CM | POA: Diagnosis present

## 2021-11-29 DIAGNOSIS — Z7984 Long term (current) use of oral hypoglycemic drugs: Secondary | ICD-10-CM | POA: Insufficient documentation

## 2021-11-29 HISTORY — DX: Essential (primary) hypertension: I10

## 2021-11-29 NOTE — ED Provider Notes (Signed)
[facility] Provider Note   CSN: [id number] Arrival date & time: 11/29/21  2017     History {Add pertinent medical, surgical, social history, OB history to HPI:1} Chief Complaint  Patient presents with   Hypertension    Alilah Mcmeans is a 56 y.o. female.  The history is provided by the patient.  Hypertension  Sophi Tanish Sinkler is a 56 y.o. female who presents to the Emergency Department complaining of HTN.  She presents to the ED for evaluation of elevated blood pressure reading at home.  She just started checking her blood pressure at home this week.  She checked her BP today and it was 189/100.    She started taking olmesartan 20 mg daily for HTN three weeks ago.  On Monday she was increased to 40 mg.     No HA, CP, SOB, vision changes.  No N/V/D, no change in urination.    Hx/o T2DM, breast cancer x 2, in remission since 2019.    No tobacco, alcohol, drugs.    Sees [facility].    Occasional caffeine. Sleeping ok.  No increase in stress.      Home Medications Prior to Admission medications   Medication Sig Start Date End Date Taking? Authorizing Provider  anastrozole (ARIMIDEX) 1 MG tablet TAKE 1 TABLET BY MOUTH  DAILY 08/01/21   Gardenia Phlegm, NP  atorvastatin (LIPITOR) 10 MG tablet Take 1 tablet (10 mg total) by mouth daily. 06/05/16   Nicholas Lose, MD  diphenhydrAMINE (BENADRYL) 25 MG tablet Take 1 tablet (25 mg total) by mouth every 6 (six) hours as needed. 07/05/21   Rancour, Annie Main, MD  glimepiride (AMARYL) 4 MG tablet Take 1 tablet (4 mg total) by mouth daily with breakfast. 06/05/16   Nicholas Lose, MD  hydrocortisone cream 1 % Apply to affected area 2 times daily 07/05/21   Rancour, Annie Main, MD  metFORMIN (GLUCOPHAGE) 1000 MG tablet Take 1,000 mg by mouth daily.     [provider]  permethrin (ELIMITE) 5 % cream Apply to affected area once 07/05/21   Rancour, Annie Main, MD      Allergies     Penicillins, Shellfish allergy, and Tetracyclines & related    Review of Systems   Review of Systems  All other systems reviewed and are negative.   Physical Exam Updated Vital Signs BP (!) 152/82 (BP Location: Left Arm)   Pulse 93   Temp 98.4 F (36.9 C) (Oral)   Resp 18   Ht '5\' 7"'$  (1.702 m)   Wt 118.8 kg   LMP 10/06/2014   SpO2 99%   BMI 41.04 kg/m  Physical Exam Vitals and nursing note reviewed.  Constitutional:      Appearance: She is well-developed.  HENT:     Head: Normocephalic and atraumatic.  Cardiovascular:     Rate and Rhythm: Normal rate and regular rhythm.     Heart sounds: No murmur heard. Pulmonary:     Effort: Pulmonary effort is normal. No respiratory distress.     Breath sounds: Normal breath sounds.  Abdominal:     Palpations: Abdomen is soft.     Tenderness: There is no abdominal tenderness. There is no guarding or rebound.  Musculoskeletal:        General: No swelling or tenderness.  Skin:    General: Skin is warm and dry.  Neurological:     Mental Status: She is alert and oriented to person, place, and time.  Psychiatric:  Behavior: Behavior normal.     ED Results / Procedures / Treatments   Labs (all labs ordered are listed, but only abnormal results are displayed) Labs Reviewed - No data to display  EKG EKG Interpretation  Date/Time:  Friday November 29 2021 20:34:29 EDT Ventricular Rate:  98 PR Interval:  148 QRS Duration: 74 QT Interval:  338 QTC Calculation: 431 R Axis:   12 Text Interpretation: Normal sinus rhythm Septal infarct , age undetermined Abnormal ECG Confirmed by Quintella Reichert [phone]) on 11/29/2021 11:01:33 PM  Radiology No results found.  Procedures Procedures  {Document cardiac monitor, telemetry assessment procedure when appropriate:1}  Medications Ordered in ED Medications - No data to display  ED Course/ Medical Decision Making/ A&P                           Medical Decision  Making  ***  {Document critical care time when appropriate:1} {Document review of labs and clinical decision tools ie heart score, Chads2Vasc2 etc:1}  {Document your independent review of radiology images, and any outside records:1} {Document your discussion with family members, caretakers, and with consultants:1} {Document social determinants of health affecting pt's care:1} {Document your decision making why or why not admission, treatments were needed:1} Final Clinical Impression(s) / ED Diagnoses Final diagnoses:  None    Rx / DC Orders ED Discharge Orders     None

## 2021-11-29 NOTE — ED Triage Notes (Signed)
Pt states got BP monitor today and it read 543 systolic  Denies Pain or vision changes, Denies Sob  Pt states started olmesartan 2 weeks ago, took 40 mg today

## 2021-11-29 NOTE — ED Notes (Signed)
Patient was seen at her PCP office a few weeks ago and was prescribed HTN medication. Patients blood pressure still remains slightly elevated today. No complaint of headaches, dizziness or blurred vision.

## 2021-11-30 LAB — CBC WITH DIFFERENTIAL/PLATELET
Abs Immature Granulocytes: 0.02 10*3/uL (ref 0.00–0.07)
Basophils Absolute: 0 10*3/uL (ref 0.0–0.1)
Basophils Relative: 0 %
Eosinophils Absolute: 0.1 10*3/uL (ref 0.0–0.5)
Eosinophils Relative: 2 %
HCT: 36.1 % (ref 36.0–46.0)
Hemoglobin: 11.1 g/dL — ABNORMAL LOW (ref 12.0–15.0)
Immature Granulocytes: 0 %
Lymphocytes Relative: 32 %
Lymphs Abs: 2.9 10*3/uL (ref 0.7–4.0)
MCH: 25.8 pg — ABNORMAL LOW (ref 26.0–34.0)
MCHC: 30.7 g/dL (ref 30.0–36.0)
MCV: 83.8 fL (ref 80.0–100.0)
Monocytes Absolute: 0.6 10*3/uL (ref 0.1–1.0)
Monocytes Relative: 6 %
Neutro Abs: 5.2 10*3/uL (ref 1.7–7.7)
Neutrophils Relative %: 60 %
Platelets: 322 10*3/uL (ref 150–400)
RBC: 4.31 MIL/uL (ref 3.87–5.11)
RDW: 16.3 % — ABNORMAL HIGH (ref 11.5–15.5)
WBC: 8.9 10*3/uL (ref 4.0–10.5)
nRBC: 0 % (ref 0.0–0.2)

## 2021-11-30 LAB — BASIC METABOLIC PANEL
Anion gap: 7 (ref 5–15)
BUN: 14 mg/dL (ref 6–20)
CO2: 25 mmol/L (ref 22–32)
Calcium: 8.9 mg/dL (ref 8.9–10.3)
Chloride: 107 mmol/L (ref 98–111)
Creatinine, Ser: 0.8 mg/dL (ref 0.44–1.00)
GFR, Estimated: >60 mL/min (ref >60)
Glucose, Bld: 102 mg/dL — ABNORMAL HIGH (ref 70–99)
Potassium: 3.8 mmol/L (ref 3.5–5.1)
Sodium: 139 mmol/L (ref 135–145)

## 2021-12-19 NOTE — Progress Notes (Signed)
Patient Care Team: Lurline Del, DO as PCP - General (Family Medicine) Jovita Kussmaul, MD as Consulting Physician (General Surgery) Nicholas Lose, MD as Consulting Physician (Hematology and Oncology) Gery Pray, MD as Consulting Physician (Radiation Oncology) Rockwell Germany, RN as Registered Nurse Mauro Kaufmann, RN as Registered Nurse Holley Bouche, NP (Inactive) as Nurse Practitioner (Nurse Practitioner) Sylvan Cheese, NP as Nurse Practitioner (Nurse Practitioner)  DIAGNOSIS:  Encounter Diagnosis  Name Primary?   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; ~ 3 cm AP diameter linear clumped NME extends from the medial biopsy cavity posteriorly in the central breast which is suspicious for disease extension   04/26/2014 Clinical Stage   Stage 0: Tis Nx   06/05/2014 Definitive Surgery   Right lumpectomy [name]): ILC, grade 2/3, 0.6 cm, LCIS invasive ca focally present at inferior margin (rt Medial); ILC, grade 2-3, 1.7 cm with neg margins with LCIS (Rt Lateral) ER 100%, PR 99%, Ki 67 6%, HER2/neu neg (ratio 1.14)   06/05/2014 Oncotype testing   Score: 8 (ROR 6%)   06/05/2014 Pathologic Stage   Stage IA: mpT1c pNx   06/23/2014 Surgery   Right breast excision: Benign, 0/3 lymph nodes negative   07/24/2014 - 09/08/2014 Radiation Therapy   Adjuvant RT completed [name]): Right breast 45 Gy over 25 fractions.  Right breast boost 16  Gy over 8 fractions.  Total dose: 60 Gy.     09/25/2014 - 07/24/2016 Anti-estrogen oral therapy   Tamoxifen 20 mg daily (Amiri Tritch).   11/02/2014 Procedure   Breast/Ovarian panel revealed no clinically significant variant at ATM, BARD1, BRCA1, BRCA2, BRIP1, CDH1, CHEK2, FANCC, MLH1, MSH2, MSH6, NBN, PALB2, PMS2, PTEN, RAD51C, RAD51D, TP53, and XRCC2   11/03/2014 Survivorship   Survivorship visit completed and copy of survivorship care plan provided to patient.   05/07/2016 Breast MRI   Left breast non-mass enhancement spanning 6.9 cm. Small enhancing mass 5.6 cm, no lymph nodes. Right breast normal   05/20/2016 Pathology Results   Left breast biopsy posterior outer: Invasive lobular cancer with LCIS, ER 95%, PR 0%, Ki-67 20%, HER-2 negative ratio 1.25   07/24/2016 Surgery   Left mastectomy: ILC grade 2, 1.4 cm, margins negative, 0/2 lymph nodes negative, T1 CN 0 stage IA, ER 95%, PR 0%, HER-2 negative, Ki-67 20% Right mastectomy: Benign   08/11/2016 Oncotype testing   Oncotype score 26:17% risk of recurrence, high intermediate risk   08/25/2016 - 10/27/2016 Chemotherapy   Taxotere and Cytoxan 4 cycles    11/10/2016 -  Anti-estrogen oral therapy   Anastrozole 1 mg daily     CHIEF COMPLIANT: Follow-up of left breast cancer on anastrozole therapy   INTERVAL HISTORY: [name] [name] is a Follow-up of left breast cancer on anastrozole therapy. She presents to the clinic for a follow-up. She is tolerating the anastrozole extremely well. She does have mild hot flashes but tolerable. She states that she started taking Ozempic about 2 months ago. She has some  suppression with eating. She denies any pain or discomfort in breast.    ALLERGIES:  is allergic to penicillins, shellfish allergy, and tetracyclines & related.  MEDICATIONS:  Current Outpatient Medications  Medication Sig Dispense Refill   anastrozole (ARIMIDEX) 1 MG tablet TAKE 1 TABLET BY MOUTH  DAILY 90 tablet 3   atorvastatin  (LIPITOR) 10 MG tablet Take 1 tablet (10 mg total) by mouth daily.     diphenhydrAMINE (BENADRYL) 25 MG tablet Take 1 tablet (25 mg total) by mouth every 6 (six) hours as needed. 30 tablet 0   glimepiride (AMARYL) 4 MG tablet Take 1 tablet (4 mg total) by mouth daily with breakfast.     hydrocortisone cream 1 % Apply to affected area 2 times daily 15 g 0   metFORMIN (GLUCOPHAGE) 1000 MG tablet Take 1,000 mg by mouth daily.      OZEMPIC, 0.25 OR 0.5 MG/DOSE, 2 MG/3ML SOPN Inject into the skin.     permethrin (ELIMITE) 5 % cream Apply to affected area once 60 g 0   No current facility-administered medications for this visit.    PHYSICAL EXAMINATION: ECOG PERFORMANCE STATUS: 1 - Symptomatic but completely ambulatory  Vitals:   12/20/21 1145  BP: (!) 155/87  Pulse: (!) 105  Resp: 18  Temp: 97.9 F (36.6 C)  SpO2: 99%   Filed Weights   12/20/21 1145  Weight: 265 lb 11.2 oz (120.5 kg)      LABORATORY DATA:  I have reviewed the data as listed    Latest Ref Rng & Units 11/30/2021   12:11 AM 01/15/2018    4:00 PM 09/09/2017    3:39 PM  CMP  Glucose 70 - 99 mg/dL 102  138  125   BUN 6 - 20 mg/dL _0 Creatinine 0.44 - 1.00 mg/dL 0.80  0.92  0.81   Sodium 135 - 145 mmol/L 139  139  143   Potassium 3.5 - 5.1 mmol/L 3.8  3.9  4.0   Chloride 98 - 111 mmol/L 107  103  105   CO2 22 - 32 mmol/L _1 Calcium 8.9 - 10.3 mg/dL 8.9  9.1  9.2     Lab Results  Component Value Date   WBC 8.9 11/30/2021   HGB 11.1 (L) 11/30/2021   HCT 36.1 11/30/2021   MCV 83.8 11/30/2021   PLT 322 11/30/2021   NEUTROABS 5.2 11/30/2021    ASSESSMENT & PLAN:  Breast cancer of upper-outer quadrant of right female breast Bilateral mastectomies 07/24/2016  Left mastectomy: ILC grade 2, 1.4 cm, margins negative, 0/2 lymph nodes negative, T1 CN 0 stage IA, ER 95%, PR 0%, HER-2 negative, Ki-67 20% Right mastectomy: Benign   Oncotype DX score 26:17% risk of distant recurrence    Recommendation: 1. Adjuvant chemotherapy with Taxotere and Cytoxan every 3 weeks 4 cycles started 08/25/2016-10/27/2016 2. followed by anastrozole 1 mg daily (labs on 08/04/2016 showed [place] 21.7 and estradiol less than 2.5)  -------------------------------------------------------------------------- Current treatment: Anastrozole 1 mg daily   Anastrozole toxicities: Hot flashes: Mostly resolved Denies any arthralgias or myalgias.   Breast cancer surveillance: Breast exam: 12/20/2021: Benign No role of imaging studies because she had bilateral mastectomies      Return to clinic in 1 year for follow-up    No orders of the defined types were placed in this encounter.  The patient has a good understanding of the overall plan. she agrees with it.  she will call with any problems that may develop before the next visit here. Total time spent: 30 mins including face to face time and time spent for planning, charting and co-ordination of care   Harriette Ohara, MD 12/20/21    I Gardiner Coins am scribing for Dr. Lindi Adie  I have reviewed the above documentation for accuracy and completeness, and I agree with the above.

## 2021-12-20 ENCOUNTER — Other Ambulatory Visit: Payer: Self-pay

## 2021-12-20 ENCOUNTER — Inpatient Hospital Stay: Payer: 59 | Attending: Hematology and Oncology | Admitting: Hematology and Oncology

## 2021-12-20 VITALS — BP 155/87 | HR 105 | Temp 97.9°F | Resp 18 | Ht 67.0 in | Wt 265.7 lb

## 2021-12-20 DIAGNOSIS — Z79899 Other long term (current) drug therapy: Secondary | ICD-10-CM | POA: Diagnosis not present

## 2021-12-20 DIAGNOSIS — C50411 Malignant neoplasm of upper-outer quadrant of right female breast: Secondary | ICD-10-CM

## 2021-12-20 DIAGNOSIS — Z880 Allergy status to penicillin: Secondary | ICD-10-CM | POA: Insufficient documentation

## 2021-12-20 DIAGNOSIS — R232 Flushing: Secondary | ICD-10-CM | POA: Diagnosis not present

## 2021-12-20 DIAGNOSIS — Z170 Estrogen receptor positive status [ER+]: Secondary | ICD-10-CM

## 2021-12-20 DIAGNOSIS — Z79811 Long term (current) use of aromatase inhibitors: Secondary | ICD-10-CM | POA: Insufficient documentation

## 2021-12-20 NOTE — Assessment & Plan Note (Signed)
Bilateral mastectomies 07/24/2016  Left mastectomy: ILC grade 2, 1.4 cm, margins negative, 0/2 lymph nodes negative, T1 CN 0 stage IA, ER 95%, PR 0%, HER-2 negative, Ki-67 20% Right mastectomy: Benign   Oncotype DX score 26:17% risk of distant recurrence   Recommendation: 1. Adjuvant chemotherapy with Taxotere and Cytoxan every 3 weeks 4 cycles started 08/25/2016-10/27/2016 2. followed by anastrozole 1 mg daily (labs on 08/04/2016 showed [place] 21.7 and estradiol less than 2.5)  -------------------------------------------------------------------------- Current treatment: Anastrozole 1 mg daily   Anastrozole toxicities: Hot flashes: Mostly resolved Denies any arthralgias or myalgias.   Breast cancer surveillance: Breast exam: 12/20/2021: Benign No role of imaging studies because she had bilateral mastectomies      Return to clinic in 1 year for follow-up

## 2021-12-23 ENCOUNTER — Telehealth: Payer: Self-pay | Admitting: Hematology and Oncology

## 2021-12-23 NOTE — Telephone Encounter (Signed)
Scheduled appointment per 11/10 los. Left voicemail.

## 2022-02-20 NOTE — Progress Notes (Signed)
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 female here for evaluation of joint pain especially left knee and elevated sedimentation rate and c reactive protein. She has known moderate to severe knee osteoarthritis worst in medial compartment on imaging. She has a history of ER positive breast cancer s/p taxotere and cytoxan chemotherapy and bilateral mastectomies in 2018 and on anastrozole maintenance. Knee pain ongoing for years doing worse particularly in past year. She does not like to take any oral medications more than absolutely required. Pain is usually worse when walking for prolonged time and she avoids stairs if possible or sitting on the floor or very low seats due to knee pain. She is curious about topical medication options and also wants to avoid invasive procedures and surgery as much as possible. Outside of knee pain she does not have a lot of other joint complaints. She thinks her left knee is sometimes a bit swollen or bigger than the right knee but no swelling elsewhere.   Activities of Daily Living:  Patient reports morning stiffness for 0    .   Patient Denies nocturnal pain.  Difficulty dressing/grooming: Denies Difficulty climbing stairs: Reports Difficulty getting out of chair: Denies Difficulty using hands for taps, buttons, cutlery, and/or writing: Denies  Review of Systems  Constitutional:  Negative for fatigue.  HENT:  Negative for mouth sores and mouth dryness.   Eyes:  Negative for dryness.  Respiratory:  Negative for shortness of breath.   Cardiovascular:  Negative for chest pain and palpitations.  Gastrointestinal:  Negative for blood in stool,  constipation and diarrhea.  Endocrine: Negative for increased urination.  Genitourinary:  Negative for involuntary urination.  Musculoskeletal:  Negative for joint pain, gait problem, joint pain, joint swelling, myalgias, muscle weakness, morning stiffness, muscle tenderness and myalgias.  Skin:  Negative for color change, rash, hair loss and sensitivity to sunlight.  Allergic/Immunologic: Negative for susceptible to infections.  Neurological:  Negative for dizziness and headaches.  Hematological:  Negative for swollen glands.  Psychiatric/Behavioral:  Negative for depressed mood and sleep disturbance. The patient is not nervous/anxious.     PMFS History:  Patient Active Problem List   Diagnosis Date Noted   At risk for decreased bone density 03/09/2022   Bilateral primary osteoarthritis of knee 02/21/2022   Adverse effect of unspecified drugs, medicaments and biological substances, initial encounter 02/21/2022   Telogen effluvium 02/21/2022   S/P mastectomy, bilateral 01/29/2018   S/P breast reconstruction 01/29/2018   Breast asymmetry following reconstructive surgery 01/15/2018   History of radiation therapy 03/24/2017   Acquired absence of bilateral breasts and nipples 03/17/2017   Port catheter in place 09/01/2016   Malignant neoplasm of upper-inner quadrant of left breast in female, estrogen receptor positive ([place]) 07/31/2016   Breast cancer ([place]) 07/24/2016   History of right breast cancer 06/10/2016   Genetic testing 11/27/2014   Family history of breast cancer in female 11/02/2014   Family history of colon cancer in mother 11/02/2014   Breast cancer of upper-outer quadrant of right female breast (  [place]) 04/26/2014    Past Medical History:  Diagnosis Date   Breast cancer ([place]) 2016   right breast cancer, 2018- Left   Diabetes mellitus without complication ([place])    TYpe II   History of radiation therapy 2016   Hypertension    Pneumonia 04/2016    Family History  Problem  Relation Age of Onset   Colon cancer Mother        dx. late 35s   Congestive Heart Failure Father        smoker   Cancer Maternal Aunt        unspecified type; dx. 70s   Heart attack Maternal Uncle    Breast cancer Paternal Aunt        dx. 46s   Heart attack Paternal Uncle    Heart attack Paternal Grandmother    Heart attack Paternal Grandfather    Congestive Heart Failure Paternal Aunt    Cancer Cousin        unspecified type/unspecified type   Breast cancer Cousin 69   Past Surgical History:  Procedure Laterality Date   AXILLARY SENTINEL NODE BIOPSY Right 06/23/2014   Procedure: AXILLARY SENTINEL NODE BIOPSY;  Surgeon: Autumn Messing III, MD;  Location: [place];  Service: General;  Laterality: Right;   BREAST LUMPECTOMY WITH NEEDLE LOCALIZATION Right 06/05/2014   Procedure: RIGHT BREAST LUMPECTOMY WITH NEEDLE LOCALIZATION;  Surgeon: Autumn Messing III, MD;  Location: [place];  Service: General;  Laterality: Right;   BREAST RECONSTRUCTION WITH PLACEMENT OF TISSUE EXPANDER AND FLEX HD (ACELLULAR HYDRATED DERMIS) Bilateral 07/24/2016   Procedure: IMMEDIATE BREAST RECONSTRUCTION WITH PLACEMENT OF TISSUE EXPANDER AND FLEX HD (ACELLULAR HYDRATED DERMIS);  Surgeon: Wallace Going, DO;  Location: [place];  Service: Plastics;  Laterality: Bilateral;   BREAST SURGERY  03/17/2017   LATISSIMUS FLAP TO RIGHT CHEST [place] (Right Chest)   LATISSIMUS FLAP TO BREAST Right 03/17/2017   Procedure: LATISSIMUS FLAP TO RIGHT CHEST [place];  Surgeon: Wallace Going, DO;  Location: [place];  Service: Plastics;  Laterality: Right;   LIPOSUCTION WITH LIPOFILLING Bilateral 01/21/2018   Procedure: Liposuction from abdomen to bilateral breasts;  Surgeon: Wallace Going, DO;  Location: [place];  Service: Plastics;  Laterality: Bilateral;   MASTECTOMY W/ SENTINEL NODE BIOPSY Bilateral 07/24/2016   Procedure: LEFT MASTECTOMY WITH LEFT SENTINEL  LYMPH NODE BIOPSY, RIGHT PROPHYLACTIC MASTECTOMY;  Surgeon: Jovita Kussmaul, MD;  Location: [place];  Service: General;  Laterality: Bilateral;   PORT-A-CATH REMOVAL N/A 11/10/2016   Procedure: REMOVAL PORT-A-CATH;  Surgeon: Jovita Kussmaul, MD;  Location: [place];  Service: General;  Laterality: N/A;   PORTACATH PLACEMENT N/A 08/21/2016   Procedure: INSERTION PORT-A-CATH WITH [place];  Surgeon: Jovita Kussmaul, MD;  Location: [place];  Service: General;  Laterality: N/A;   RE-EXCISION OF BREAST LUMPECTOMY Right 06/23/2014   Procedure: RE-EXCISION OF RIGHT BREAST INFERIOR MEDIAL MARGIN AND SENTINEL LYMPH NODE BIOPSY;  Surgeon: Autumn Messing III, MD;  Location: [place];  Service: General;  Laterality: Right;   REMOVAL OF BILATERAL TISSUE EXPANDERS WITH PLACEMENT OF BILATERAL BREAST IMPLANTS Bilateral 06/25/2017   Procedure: REMOVAL OF LEFT TISSUE EXPANDERS WITH PLACEMENT OF LEFT SILICONE BREAST IMPLANTS;  Surgeon: Wallace Going, DO;  Location: [place];  Service: Plastics;  Laterality: Bilateral;   REMOVAL OF TISSUE EXPANDER AND PLACEMENT OF IMPLANT Right 09/10/2017   Procedure: REMOVAL OF TISSUE EXPANDER AND PLACEMENT  OF IMPLANT;  Surgeon: Wallace Going, DO;  Location: [place];  Service: Plastics;  Laterality: Right;   Social History   Social History Narrative   Not on file    There is no immunization history on file for this patient.   Objective: Vital Signs: BP 132/81 (BP Location: Right Arm, Patient Position: Sitting, Cuff Size: Large)   Pulse 86   Resp 14   Ht 5' 7.5" (1.715 m)   Wt 245 lb (111.1 kg)   LMP 10/06/2014   BMI 37.81 kg/m    Physical Exam Constitutional:      Appearance: She is obese.  Eyes:     Conjunctiva/sclera: Conjunctivae normal.  Cardiovascular:     Rate and Rhythm: Normal rate and regular rhythm.  Pulmonary:     Effort: Pulmonary effort is normal.     Breath sounds: Normal breath sounds.  Lymphadenopathy:      Cervical: No cervical adenopathy.  Skin:    General: Skin is warm and dry.  Neurological:     Mental Status: She is alert.  Psychiatric:        Mood and Affect: Mood normal.      Musculoskeletal Exam:  Shoulders full ROM no tenderness or swelling Elbows full ROM no tenderness or swelling Wrists full ROM no tenderness or swelling Fingers full ROM no tenderness or swelling Knees full ROM left knee medial and anterior tenderness, bilateral crepitus, no palpable effusions   Investigation: No additional findings.  Imaging: XR KNEE 3 VIEW LEFT  Result Date: 02/21/2022 X-ray left knee 3 views There is moderate to severe medial compartment joint space loss and large marginal osteophyte processes.  Lateral joint space appears preserved but abnormal due to appears to be acquired varus deformity.  Osteophyte process also present on the posterior joint aspect.  Medial and lateral spurring at the patellofemoral joint with small superior osteophyte visible and lateral view.  No erosions or abnormal calcifications seen. Impression Moderate to severe medial compartment osteoarthritis with milder patellofemoral joint involvement  XR KNEE 3 VIEW RIGHT  Result Date: 02/21/2022 X-ray right knee 3 views Mild medial compartment joint space narrowing and early marginal osteophyte processes.  Lateral joint space appears normal.  There is mild patellofemoral joint space narrowing towards the lateral border with osteophyte process.  Superior osteophyte present on lateral view.  No erosions or abnormal calcifications seen. Impression Mild appearing osteoarthritis of medial and patellofemoral right knee compartments, worse at the medial compartment   Recent Labs: Lab Results  Component Value Date   WBC 8.9 11/30/2021   HGB 11.1 (L) 11/30/2021   PLT 322 11/30/2021   NA 139 11/30/2021   K 3.8 11/30/2021   CL 107 11/30/2021   CO2 25 11/30/2021   GLUCOSE 102 (H) 11/30/2021   BUN 14 11/30/2021   CREATININE  0.80 11/30/2021   BILITOT 0.22 01/26/2017   ALKPHOS 104 01/26/2017   AST 11 01/26/2017   ALT 14 01/26/2017   PROT 7.4 01/26/2017   ALBUMIN 3.9 01/26/2017   CALCIUM 8.9 11/30/2021   GFRAA >60 01/15/2018    Speciality Comments: No specialty comments available.  Procedures:  No procedures performed Allergies: Penicillins, Shellfish allergy, and Tetracyclines & related   Assessment / Plan:     Visit Diagnoses: Bilateral primary osteoarthritis of knee - Plan: XR KNEE 3 VIEW RIGHT, XR KNEE 3 VIEW LEFT  Bilateral knee arthritis symptoms are consistent and x-rays checked in clinic today demonstrates significantly more advanced medial compartment osteoarthritis of the  left side this matches up well with her reported worse side and the observed intermittent swelling.  We discussed multiple local treatments and supplements are supportive device options for management of osteoarthritis.  Not currently in exacerbation requiring any additional short-term prescription medication or procedure.  Recommend we can follow-up in a couple months to see if symptom severity keeps getting worse again may benefit with referral to physical therapy, additional medication, or trial of intra-articular injection.  At risk for decreased bone density  She is on long-term anastrozole treatment for estrogen receptor positive breast cancer.  This hormonal therapy is associated with increased risk of osteoporosis so would definitely benefit for bone densitometry screening.  Looks like she had a test ordered in June 2019 but I do not see that a test was ever completed on review of her records today.  Orders: Orders Placed This Encounter  Procedures   XR KNEE 3 VIEW RIGHT   XR KNEE 3 VIEW LEFT   No orders of the defined types were placed in this encounter.    Follow-Up Instructions: Return in about 3 months (around 05/23/2022) for OA/?OP f/u 56mo.   CCollier Salina MD  Note - This record has been created using  [employer]  Chart creation errors have been sought, but may not always  have been located. Such creation errors do not reflect on  the standard of medical care.

## 2022-02-21 ENCOUNTER — Ambulatory Visit (INDEPENDENT_AMBULATORY_CARE_PROVIDER_SITE_OTHER): Payer: 59

## 2022-02-21 ENCOUNTER — Ambulatory Visit: Payer: 59 | Attending: Internal Medicine | Admitting: Internal Medicine

## 2022-02-21 ENCOUNTER — Encounter: Payer: Self-pay | Admitting: Internal Medicine

## 2022-02-21 ENCOUNTER — Ambulatory Visit: Payer: 59

## 2022-02-21 VITALS — BP 132/81 | HR 86 | Resp 14 | Ht 67.5 in | Wt 245.0 lb

## 2022-02-21 DIAGNOSIS — Z9189 Other specified personal risk factors, not elsewhere classified: Secondary | ICD-10-CM

## 2022-02-21 DIAGNOSIS — T50905A Adverse effect of unspecified drugs, medicaments and biological substances, initial encounter: Secondary | ICD-10-CM | POA: Insufficient documentation

## 2022-02-21 DIAGNOSIS — M170 Bilateral primary osteoarthritis of knee: Secondary | ICD-10-CM

## 2022-02-21 DIAGNOSIS — L650 Telogen effluvium: Secondary | ICD-10-CM | POA: Insufficient documentation

## 2022-02-21 NOTE — Patient Instructions (Signed)

## 2022-03-09 DIAGNOSIS — Z9189 Other specified personal risk factors, not elsewhere classified: Secondary | ICD-10-CM | POA: Insufficient documentation

## 2022-05-23 ENCOUNTER — Ambulatory Visit: Payer: 59 | Admitting: Internal Medicine

## 2022-07-04 ENCOUNTER — Other Ambulatory Visit: Payer: Self-pay | Admitting: Adult Health

## 2022-07-04 DIAGNOSIS — Z170 Estrogen receptor positive status [ER+]: Secondary | ICD-10-CM

## 2022-07-08 ENCOUNTER — Encounter: Payer: Self-pay | Admitting: Hematology and Oncology

## 2022-08-07 NOTE — Progress Notes (Deleted)
Office Visit Note  Patient: [name] [name]             Date of Birth: 07-Apr-1965           MRN: [id number]             PCP: Jackelyn Poling, DO Referring: Jackelyn Poling, DO Visit Date: 08/08/2022   Subjective:  No chief complaint on file.   History of Present Illness: [name] is a 57 y.o. female here for follow up ***   Previous HPI 02/21/22 [name] is a 57 y.o. female here for evaluation of joint pain especially left knee and elevated sedimentation rate and c reactive protein. She has known moderate to severe knee osteoarthritis worst in medial compartment on imaging. She has a history of ER positive breast cancer s/p taxotere and cytoxan chemotherapy and bilateral mastectomies in 2018 and on anastrozole maintenance. Knee pain ongoing for years doing worse particularly in past year. She does not like to take any oral medications more than absolutely required. Pain is usually worse when walking for prolonged time and she avoids stairs if possible or sitting on the floor or very low seats due to knee pain. She is curious about topical medication options and also wants to avoid invasive procedures and surgery as much as possible. Outside of knee pain she does not have a lot of other joint complaints. She thinks her left knee is sometimes a bit swollen or bigger than the right knee but no swelling elsewhere.   No Rheumatology ROS completed.   PMFS History:  Patient Active Problem List   Diagnosis Date Noted   At risk for decreased bone density 03/09/2022   Bilateral primary osteoarthritis of knee 02/21/2022   Adverse effect of unspecified drugs, medicaments and biological substances, initial encounter 02/21/2022   Telogen effluvium 02/21/2022   S/P mastectomy, bilateral 01/29/2018   S/P breast reconstruction 01/29/2018   Breast asymmetry following reconstructive surgery 01/15/2018   History of radiation therapy 03/24/2017   Acquired absence of bilateral breasts  and nipples 03/17/2017   Port catheter in place 09/01/2016   Malignant neoplasm of upper-inner quadrant of left breast in female, estrogen receptor positive ([facility]) 07/31/2016   Breast cancer ([facility]) 07/24/2016   History of right breast cancer 06/10/2016   Genetic testing 11/27/2014   Family history of breast cancer in female 11/02/2014   Family history of colon cancer in mother 11/02/2014   Breast cancer of upper-outer quadrant of right female breast ([facility]) 04/26/2014    Past Medical History:  Diagnosis Date   Breast cancer ([facility]) 2016   right breast cancer, 2018- Left   Diabetes mellitus without complication ([facility])    TYpe II   History of radiation therapy 2016   Hypertension    Pneumonia 04/2016    Family History  Problem Relation Age of Onset   Colon cancer Mother        dx. late 52s   Congestive Heart Failure Father        smoker   Cancer Maternal Aunt        unspecified type; dx. 70s   Heart attack Maternal Uncle    Breast cancer Paternal Aunt        dx. 40s   Heart attack Paternal Uncle    Heart attack Paternal Grandmother    Heart attack Paternal Grandfather    Congestive Heart Failure Paternal Aunt    Cancer Cousin        unspecified type/unspecified  type   Breast cancer Cousin 52   Past Surgical History:  Procedure Laterality Date   AXILLARY SENTINEL NODE BIOPSY Right 06/23/2014   Procedure: AXILLARY SENTINEL NODE BIOPSY;  Surgeon: Chevis Pretty III, MD;  Location: [facility];  Service: General;  Laterality: Right;   BREAST LUMPECTOMY WITH NEEDLE LOCALIZATION Right 06/05/2014   Procedure: RIGHT BREAST LUMPECTOMY WITH NEEDLE LOCALIZATION;  Surgeon: Chevis Pretty III, MD;  Location: [facility];  Service: General;  Laterality: Right;   BREAST RECONSTRUCTION WITH PLACEMENT OF TISSUE EXPANDER AND FLEX HD (ACELLULAR HYDRATED DERMIS) Bilateral 07/24/2016   Procedure: IMMEDIATE BREAST RECONSTRUCTION WITH PLACEMENT OF TISSUE EXPANDER AND FLEX HD (ACELLULAR  HYDRATED DERMIS);  Surgeon: Peggye Form, DO;  Location: [facility];  Service: Plastics;  Laterality: Bilateral;   BREAST SURGERY  03/17/2017   LATISSIMUS FLAP TO RIGHT CHEST [place] (Right Chest)   LATISSIMUS FLAP TO BREAST Right 03/17/2017   Procedure: LATISSIMUS FLAP TO RIGHT CHEST [place];  Surgeon: Peggye Form, DO;  [facility];  Service: Plastics;  Laterality: Right;   LIPOSUCTION WITH LIPOFILLING Bilateral 01/21/2018   Procedure: Liposuction from abdomen to bilateral breasts;  Surgeon: Peggye Form, DO;  Location: [facility];  Service: Plastics;  Laterality: Bilateral;   MASTECTOMY W/ SENTINEL NODE BIOPSY Bilateral 07/24/2016   Procedure: LEFT MASTECTOMY WITH LEFT SENTINEL LYMPH NODE BIOPSY, RIGHT PROPHYLACTIC MASTECTOMY;  Surgeon: Griselda Miner, MD;  Location: [facility];  Service: General;  Laterality: Bilateral;   PORT-A-CATH REMOVAL N/A 11/10/2016   Procedure: REMOVAL PORT-A-CATH;  Surgeon: Griselda Miner, MD;  [facility];  Service: General;  Laterality: N/A;   PORTACATH PLACEMENT N/A 08/21/2016   Procedure: INSERTION PORT-A-CATH WITH [place];  Surgeon: Griselda Miner, MD;  [facility];  Service: General;  Laterality: N/A;   RE-EXCISION OF BREAST LUMPECTOMY Right 06/23/2014   Procedure: RE-EXCISION OF RIGHT BREAST INFERIOR MEDIAL MARGIN AND SENTINEL LYMPH NODE BIOPSY;  Surgeon: Chevis Pretty III, MD;  Location: [facility];  Service: General;  Laterality: Right;   REMOVAL OF BILATERAL TISSUE EXPANDERS WITH PLACEMENT OF BILATERAL BREAST IMPLANTS Bilateral 06/25/2017   Procedure: REMOVAL OF LEFT TISSUE EXPANDERS WITH PLACEMENT OF LEFT SILICONE BREAST IMPLANTS;  Surgeon: Peggye Form, DO;  Location: [facility];  Service: Plastics;  Laterality: Bilateral;   REMOVAL OF TISSUE EXPANDER AND PLACEMENT OF IMPLANT Right 09/10/2017   Procedure: REMOVAL OF TISSUE EXPANDER AND PLACEMENT OF IMPLANT;   Surgeon: Peggye Form, DO;  [facility];  Service: Plastics;  Laterality: Right;   Social History   Social History Narrative   Not on file    There is no immunization history on file for this patient.   Objective: Vital Signs: LMP 10/06/2014    Physical Exam   Musculoskeletal Exam: ***  CDAI Exam: CDAI Score: -- Patient Global: --; Provider Global: -- Swollen: --; Tender: -- Joint Exam 08/08/2022   No joint exam has been documented for this visit   There is currently no information documented on the homunculus. Go to the Rheumatology activity and complete the homunculus joint exam.  Investigation: No additional findings.  Imaging: No results found.  Recent Labs: Lab Results  Component Value Date   WBC 8.9 11/30/2021   HGB 11.1 (L) 11/30/2021   PLT 322 11/30/2021   NA 139 11/30/2021   K 3.8 11/30/2021   CL 107 11/30/2021   CO2 25 11/30/2021  GLUCOSE 102 (H) 11/30/2021   BUN 14 11/30/2021   CREATININE 0.80 11/30/2021   BILITOT 0.22 01/26/2017   ALKPHOS 104 01/26/2017   AST 11 01/26/2017   ALT 14 01/26/2017   PROT 7.4 01/26/2017   ALBUMIN 3.9 01/26/2017   CALCIUM 8.9 11/30/2021   GFRAA >60 01/15/2018    Speciality Comments: No specialty comments available.  Procedures:  No procedures performed Allergies: Penicillins, Shellfish allergy, and Tetracyclines & related   Assessment / Plan:     Visit Diagnoses: No diagnosis found.  ***  Orders: No orders of the defined types were placed in this encounter.  No orders of the defined types were placed in this encounter.    Follow-Up Instructions: No follow-ups on file.   Fuller Plan, MD  Note - This record has been created using [employer].  Chart creation errors have been sought, but may not always  have been located. Such creation errors do not reflect on  the standard of medical care.

## 2022-08-08 ENCOUNTER — Ambulatory Visit: Payer: 59 | Admitting: Internal Medicine

## 2022-12-22 ENCOUNTER — Inpatient Hospital Stay: Payer: 59 | Attending: Hematology and Oncology | Admitting: Hematology and Oncology

## 2022-12-22 NOTE — Assessment & Plan Note (Deleted)
Bilateral mastectomies 07/24/2016  Left mastectomy: ILC grade 2, 1.4 cm, margins negative, 0/2 lymph nodes negative, T1 CN 0 stage IA, ER 95%, PR 0%, HER-2 negative, Ki-67 20% Right mastectomy: Benign   Oncotype DX score 26:17% risk of distant recurrence   Recommendation: 1. Adjuvant chemotherapy with Taxotere and Cytoxan every 3 weeks 4 cycles started 08/25/2016-10/27/2016 2. followed by anastrozole 1 mg daily (labs on 08/04/2016 showed FSH 21.7 and estradiol less than 2.5)  -------------------------------------------------------------------------- Current treatment: Anastrozole 1 mg daily   Anastrozole toxicities: Hot flashes: Mostly resolved Denies any arthralgias or myalgias.   Breast cancer surveillance: Breast exam: 12/22/2022: Benign No role of imaging studies because she had bilateral mastectomies     Return to clinic in 1 year for follow-up

## 2023-05-20 ENCOUNTER — Other Ambulatory Visit: Payer: Self-pay | Admitting: Adult Health

## 2023-05-20 DIAGNOSIS — Z170 Estrogen receptor positive status [ER+]: Secondary | ICD-10-CM

## 2023-05-21 ENCOUNTER — Encounter: Payer: Self-pay | Admitting: Hematology and Oncology

## 2023-05-21 ENCOUNTER — Telehealth: Payer: Self-pay | Admitting: Hematology and Oncology

## 2023-05-21 NOTE — Telephone Encounter (Signed)
 Confirmed with pt about scheduled date and time

## 2023-06-16 ENCOUNTER — Other Ambulatory Visit: Payer: Self-pay | Admitting: Hematology and Oncology

## 2023-06-16 DIAGNOSIS — C50411 Malignant neoplasm of upper-outer quadrant of right female breast: Secondary | ICD-10-CM

## 2023-06-17 ENCOUNTER — Encounter: Payer: Self-pay | Admitting: Hematology and Oncology

## 2023-07-09 ENCOUNTER — Other Ambulatory Visit: Payer: Self-pay | Admitting: Hematology and Oncology

## 2023-07-09 DIAGNOSIS — C50411 Malignant neoplasm of upper-outer quadrant of right female breast: Secondary | ICD-10-CM

## 2023-07-10 ENCOUNTER — Encounter: Payer: Self-pay | Admitting: Hematology and Oncology

## 2023-07-23 ENCOUNTER — Ambulatory Visit: Admitting: Hematology and Oncology

## 2023-08-19 ENCOUNTER — Other Ambulatory Visit: Payer: Self-pay | Admitting: Hematology and Oncology

## 2023-08-19 DIAGNOSIS — Z170 Estrogen receptor positive status [ER+]: Secondary | ICD-10-CM

## 2023-08-20 ENCOUNTER — Encounter: Payer: Self-pay | Admitting: Hematology and Oncology

## 2023-08-20 NOTE — Telephone Encounter (Signed)
 Called pt regarding med refill request. She has upcoming MD visit for 10/2023 and was recently sent refill in May with one refill available. Inquired to see if pt is still taking before refilling. LVM for call back.

## 2023-10-20 ENCOUNTER — Other Ambulatory Visit: Payer: Self-pay | Admitting: Oral Surgery

## 2023-10-23 LAB — SURGICAL PATHOLOGY

## 2023-10-29 ENCOUNTER — Inpatient Hospital Stay: Admitting: Hematology and Oncology

## 2023-12-21 ENCOUNTER — Telehealth: Payer: Self-pay | Admitting: Hematology and Oncology

## 2023-12-21 NOTE — Telephone Encounter (Signed)
 Left vm for pt about rescheduled appt date and time. Encouraged to call back if need to re- reschedule

## 2024-01-14 ENCOUNTER — Ambulatory Visit: Admitting: Hematology and Oncology

## 2024-01-18 ENCOUNTER — Inpatient Hospital Stay: Admitting: Hematology and Oncology

## 2024-05-05 ENCOUNTER — Inpatient Hospital Stay: Admitting: Hematology and Oncology
